# Patient Record
Sex: Female | Born: 1965 | Race: Black or African American | Hispanic: No | Marital: Married | Smoking: Never smoker
Health system: Southern US, Community
[De-identification: ages and names within clinical notes are randomized; demographics above are authoritative.]

## PROBLEM LIST (undated history)

## (undated) DIAGNOSIS — K259 Gastric ulcer, unspecified as acute or chronic, without hemorrhage or perforation: Secondary | ICD-10-CM

## (undated) DIAGNOSIS — I10 Essential (primary) hypertension: Secondary | ICD-10-CM

## (undated) DIAGNOSIS — D649 Anemia, unspecified: Secondary | ICD-10-CM

## (undated) DIAGNOSIS — E119 Type 2 diabetes mellitus without complications: Secondary | ICD-10-CM

## (undated) DIAGNOSIS — K5900 Constipation, unspecified: Secondary | ICD-10-CM

## (undated) DIAGNOSIS — E7800 Pure hypercholesterolemia, unspecified: Secondary | ICD-10-CM

## (undated) DIAGNOSIS — K862 Cyst of pancreas: Secondary | ICD-10-CM

## (undated) DIAGNOSIS — R7303 Prediabetes: Secondary | ICD-10-CM

## (undated) DIAGNOSIS — N289 Disorder of kidney and ureter, unspecified: Secondary | ICD-10-CM

## (undated) DIAGNOSIS — Z87442 Personal history of urinary calculi: Secondary | ICD-10-CM

## (undated) DIAGNOSIS — I209 Angina pectoris, unspecified: Secondary | ICD-10-CM

## (undated) DIAGNOSIS — N281 Cyst of kidney, acquired: Secondary | ICD-10-CM

## (undated) DIAGNOSIS — M5030 Other cervical disc degeneration, unspecified cervical region: Secondary | ICD-10-CM

## (undated) DIAGNOSIS — E559 Vitamin D deficiency, unspecified: Secondary | ICD-10-CM

## (undated) DIAGNOSIS — K219 Gastro-esophageal reflux disease without esophagitis: Secondary | ICD-10-CM

## (undated) DIAGNOSIS — R7302 Impaired glucose tolerance (oral): Secondary | ICD-10-CM

## (undated) DIAGNOSIS — M549 Dorsalgia, unspecified: Secondary | ICD-10-CM

## (undated) DIAGNOSIS — G4730 Sleep apnea, unspecified: Secondary | ICD-10-CM

## (undated) DIAGNOSIS — Z8489 Family history of other specified conditions: Secondary | ICD-10-CM

## (undated) HISTORY — PX: OTHER SURGICAL HISTORY: SHX169

## (undated) HISTORY — DX: Gastric ulcer, unspecified as acute or chronic, without hemorrhage or perforation: K25.9

## (undated) HISTORY — DX: Gastro-esophageal reflux disease without esophagitis: K21.9

## (undated) HISTORY — PX: CHOLECYSTECTOMY: SHX55

## (undated) HISTORY — PX: OOPHORECTOMY: SHX86

## (undated) HISTORY — PX: KNEE ARTHROSCOPY: SUR90

## (undated) HISTORY — PX: PARTIAL HYSTERECTOMY: SHX80

## (undated) HISTORY — DX: Vitamin D deficiency, unspecified: E55.9

## (undated) HISTORY — DX: Type 2 diabetes mellitus without complications: E11.9

## (undated) HISTORY — DX: Dorsalgia, unspecified: M54.9

## (undated) HISTORY — DX: Sleep apnea, unspecified: G47.30

## (undated) HISTORY — PX: BREAST BIOPSY: SHX20

## (undated) HISTORY — DX: Prediabetes: R73.03

## (undated) HISTORY — DX: Impaired glucose tolerance (oral): R73.02

## (undated) HISTORY — DX: Pure hypercholesterolemia, unspecified: E78.00

## (undated) HISTORY — PX: WISDOM TOOTH EXTRACTION: SHX21

## (undated) HISTORY — DX: Anemia, unspecified: D64.9

## (undated) HISTORY — DX: Other cervical disc degeneration, unspecified cervical region: M50.30

## (undated) HISTORY — DX: Cyst of pancreas: K86.2

---
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
## 1997-10-21 DIAGNOSIS — K259 Gastric ulcer, unspecified as acute or chronic, without hemorrhage or perforation: Secondary | ICD-10-CM

## 1997-10-21 HISTORY — DX: Gastric ulcer, unspecified as acute or chronic, without hemorrhage or perforation: K25.9

## 2001-01-02 ENCOUNTER — Observation Stay (HOSPITAL_COMMUNITY): Admission: RE | Admit: 2001-01-02 | Discharge: 2001-01-03 | Payer: Self-pay | Admitting: Obstetrics and Gynecology

## 2001-11-09 ENCOUNTER — Ambulatory Visit (HOSPITAL_COMMUNITY): Admission: RE | Admit: 2001-11-09 | Discharge: 2001-11-09 | Payer: Self-pay | Admitting: Internal Medicine

## 2001-11-09 ENCOUNTER — Encounter: Payer: Self-pay | Admitting: Internal Medicine

## 2005-01-28 ENCOUNTER — Ambulatory Visit (HOSPITAL_COMMUNITY): Admission: RE | Admit: 2005-01-28 | Discharge: 2005-01-28 | Payer: Self-pay | Admitting: *Deleted

## 2005-02-11 ENCOUNTER — Encounter: Admission: RE | Admit: 2005-02-11 | Discharge: 2005-02-11 | Payer: Self-pay | Admitting: *Deleted

## 2005-08-14 ENCOUNTER — Ambulatory Visit (HOSPITAL_COMMUNITY): Admission: RE | Admit: 2005-08-14 | Discharge: 2005-08-14 | Payer: Self-pay | Admitting: *Deleted

## 2006-01-28 ENCOUNTER — Encounter: Admission: RE | Admit: 2006-01-28 | Discharge: 2006-01-28 | Payer: Self-pay | Admitting: Obstetrics and Gynecology

## 2007-03-23 ENCOUNTER — Ambulatory Visit (HOSPITAL_COMMUNITY): Admission: RE | Admit: 2007-03-23 | Discharge: 2007-03-23 | Payer: Self-pay | Admitting: *Deleted

## 2007-07-31 ENCOUNTER — Ambulatory Visit (HOSPITAL_COMMUNITY): Admission: RE | Admit: 2007-07-31 | Discharge: 2007-07-31 | Payer: Self-pay | Admitting: Obstetrics and Gynecology

## 2007-07-31 ENCOUNTER — Encounter (INDEPENDENT_AMBULATORY_CARE_PROVIDER_SITE_OTHER): Payer: Self-pay | Admitting: Obstetrics and Gynecology

## 2008-09-13 ENCOUNTER — Ambulatory Visit (HOSPITAL_COMMUNITY): Admission: RE | Admit: 2008-09-13 | Discharge: 2008-09-13 | Payer: Self-pay | Admitting: Internal Medicine

## 2009-06-19 ENCOUNTER — Ambulatory Visit (HOSPITAL_COMMUNITY): Admission: RE | Admit: 2009-06-19 | Discharge: 2009-06-19 | Payer: Self-pay | Admitting: Internal Medicine

## 2009-09-20 ENCOUNTER — Ambulatory Visit (HOSPITAL_COMMUNITY): Admission: RE | Admit: 2009-09-20 | Discharge: 2009-09-20 | Payer: Self-pay | Admitting: Internal Medicine

## 2010-02-01 ENCOUNTER — Emergency Department (HOSPITAL_COMMUNITY): Admission: EM | Admit: 2010-02-01 | Discharge: 2010-02-01 | Payer: Self-pay | Admitting: Emergency Medicine

## 2010-02-06 ENCOUNTER — Emergency Department (HOSPITAL_COMMUNITY): Admission: EM | Admit: 2010-02-06 | Discharge: 2010-02-06 | Payer: Self-pay | Admitting: Emergency Medicine

## 2010-09-21 ENCOUNTER — Ambulatory Visit (HOSPITAL_COMMUNITY): Admission: RE | Admit: 2010-09-21 | Discharge: 2010-09-21 | Payer: Self-pay | Admitting: Obstetrics and Gynecology

## 2010-11-16 ENCOUNTER — Other Ambulatory Visit (HOSPITAL_COMMUNITY): Payer: Self-pay | Admitting: Internal Medicine

## 2010-11-22 ENCOUNTER — Other Ambulatory Visit (HOSPITAL_COMMUNITY): Payer: Self-pay

## 2011-03-05 NOTE — Op Note (Signed)
Jenna Chavez, Jenna Chavez               ACCOUNT NO.:  [id number]   MEDICAL RECORD NO.:  [id number]          PATIENT TYPE:  AMB   LOCATION:  SDC                           FACILITY:  [facility]   PHYSICIAN:  Guy Sandifer. Henderson Cloud, M.D. DATE OF BIRTH:  Jul 14, 1966   DATE OF PROCEDURE:  07/31/2007  DATE OF DISCHARGE:                               OPERATIVE REPORT   PREOPERATIVE DIAGNOSIS:  Left ovarian cyst.   POSTOPERATIVE DIAGNOSIS:  Left ovarian cyst.   PROCEDURE:  Laparoscopy with left salpingo-oophorectomy.   SURGEON:  Guy Sandifer. Henderson Cloud, M.D.   ANESTHESIA:  General with endotracheal intubation.   SPECIMENS:  Left tube and ovary to pathology.   ESTIMATED BLOOD LOSS:  Drops.   INDICATION AND CONSENT:  This patient is a 45 year old married black  female, G1, P1, status post LAVH, with a left ovarian cyst.  Details are  dictated in the history and physical.  Laparoscopy with left salpingo-  oophorectomy has been discussed preoperatively.  Potential risks and  complications were discussed preoperatively including but not limited to  infection, organ damage, bleeding requiring transfusion of blood  products with possible transfusion reaction, HIV and hepatitis  acquisition, DVT, PE, and pneumonia.  All questions have been answered  and consent is signed on the chart.   FINDINGS:  Upper abdomen is grossly normal.  Right tube and ovary is  normal.  The left ovary has two or three 2-3-cm parovarian cysts.  There  are some filmy adhesions to the pelvic sidewall.  There are a few filmy  adhesions of the sigmoid to the vaginal cuff.   PROCEDURE:  The patient was taken to operating room, where she is  identified, placed in dorsal supine position, and general anesthesia is  induced via endotracheal intubation.  She is then placed in the dorsal  lithotomy position, prepped abdominally and vaginally, the bladder is  straight-catheterized.  A ring clamp with a prep sponge is placed in the  vagina and the  patient is prepped in a sterile fashion.  The  infraumbilical and suprapubic areas are injected in the midline with  0.5% plain Marcaine.  A small infraumbilical incision is made and a  disposable Veress needle is placed on the first attempt without  difficulty.  Syringe and drop test are normal.  Two liters of gas are  insufflated under low pressure with a good tympany in the right upper  quadrant.  The Veress needle is removed and a 10/11 XL bladeless  disposable trocar sleeve is then placed using direct visualization with  the diagnostic laparoscope.  After placement, the operative laparoscope  is placed.  A small suprapubic incision is made in the midline and a 5-  mm XL bladeless disposable trocar sleeve is placed under direct  visualization without difficulty.  The above findings are noted.  The  course of the ureter is seen to be well clear of the area of surgery.  Then using sharp dissection, the adhesions around the left ovary and  parovarian cyst are taken down without difficulty.  After adequately  mobilizing the ovary, the gyrus bipolar cautery  cutting instrument is  used to take down the infundibulopelvic ligament across the mesosalpinx,  freeing up the tube and ovary, which was placed in the posterior cul-de-  sac.  Reinspection reveals excellent hemostasis and again reveals the  course the ureter to be well clear of any of the surgery.  Then the  operative laparoscope is removed and the 5-mm laparoscope is placed  through the suprapubic trocar sleeve.  Using the Ethicon bag through the  10/11 trocar sleeve, the specimen is retrieved through the subumbilical  incision without difficulty.  Replacement of the operative laparoscope  is used, irrigation is carried out, and careful inspection reveals good  hemostasis all around.  The suprapubic trocar sleeve is removed.  The  umbilical trocar sleeve is removed as well.  Under good visualization, a  0 Vicryl is used to  reapproximate the anterior fascia of the  subumbilical incision with a single stitch.  The skin of the  subumbilical incision is then closed with interrupted 2-0 Vicryl and  Dermabond is placed on both incisions.  Ring clamp was removed.  All  counts are correct.  The patient is awakened and taken to the recovery  room in stable condition.      Guy Sandifer Henderson Cloud, M.D.  Electronically Signed     JET/MEDQ  D:  07/31/2007  T:  08/01/2007  Job:  [id number]

## 2011-03-05 NOTE — H&P (Signed)
Jenna Chavez, Jenna Chavez               ACCOUNT NO.:  [id number]   MEDICAL RECORD NO.:  [id number]           PATIENT TYPE:   LOCATION:                                FACILITY:  [facility]   PHYSICIAN:  Guy Sandifer. Henderson Cloud, M.D.      DATE OF BIRTH:   DATE OF ADMISSION:  07/31/2007  DATE OF DISCHARGE:                              HISTORY & PHYSICAL   CHIEF COMPLAINT:  Ovarian cyst.   HISTORY OF PRESENT ILLNESS:  This patient is a 45 year old, married,  black female, G1, P1, status post LAVH with pelvic pain that comes and  goes over the past several months. Ultrasound in my office on 04/21/07,  revealed bilateral ovarian cysts.  Followup ultrasound on 07/06/07,  revealed resolution of the right ovarian cyst but a growth in the left  ovary with the cyst measuring 2.7 cm with at least 2 calcified areas  suggestive of a dermoid cyst.  After discussion of options, the patient  is being admitted for laparoscopic left salpingo-oophorectomy.  Potential risks and complications have been reviewed preoperatively.   PAST MEDICAL HISTORY:  Negative.   PAST SURGICAL HISTORY:  1. Cholecystectomy 2001.  2. LAVH.   OBSTETRIC HISTORY:  Vaginal delivery x1.   FAMILY HISTORY:  Multiple gestation sister, diabetes in mother. Chronic  hypertension in mother, sister and brother. Enlarged heart in mother.  Asthma in mother.   MEDICATIONS:  None.   ALLERGIES:  NO KNOWN DRUG ALLERGIES.   SOCIAL HISTORY:  Denies tobacco, alcohol or drug abuse.   REVIEW OF SYSTEMS:  NEUROLOGICAL:  Denies headache.  CARDIAC:  Denies  chest pain.  PULMONARY:  Denies shortness of breath.  GI: Denies recent  changes in bowel habits.   PHYSICAL EXAMINATION:  VITAL SIGNS: Height 5 feet 7 inches, weight 218.4  pounds, blood pressure 130/82.  HEENT: Without thyromegaly.  LUNGS: Clear to auscultation.  HEART: Regular rate and rhythm.  BACK: Without CVA tenderness.  BREASTS: Without masses, retraction, discharge.  ABDOMEN: Soft, nontender  without masses.  PELVIC: Vulva, vagina without lesions. Adnexa nontender without palpable  masses.  EXTREMITIES: Grossly within normal limits.  NEUROLOGICAL: Exam is grossly within normal limits.   ASSESSMENT:  Left ovarian cyst.   PLAN:  Laparoscopy with left salpingo-oophorectomy.      Guy Sandifer Henderson Cloud, M.D.  Electronically Signed     JET/MEDQ  D:  07/24/2007  T:  07/24/2007  Job:  [id number]

## 2011-03-08 NOTE — Op Note (Signed)
[facility]  Patient:    ANGLEA, GORDNER                      MRN: [id number] Proc. Date: 01/02/01 Adm. Date:  [id number] Attending:  Cordelia Pen Ii                           Operative Report  PREOPERATIVE DIAGNOSIS:       Uterine leiomyomata.  POSTOPERATIVE DIAGNOSES:      1. Uterine leiomyomata.                               2. Endometriosis.  PROCEDURE:                    Laparoscopically assisted vaginal hysterectomy                               and ablation of endometriosis.  SURGEON:                      Guy Sandifer. Arleta Creek, M.D.  ASSISTANT:                    Raynald Kemp, M.D.  ANESTHESIA:                   General endotracheal intubation,                               Cherre Blanc, M.D.  ESTIMATED BLOOD LOSS:         300 cc.  INDICATIONS AND CONSENT:      This patient is a 45 year old, married black female, G1, P1, with increasingly heavy menstrual flows. She has known uterine leiomyomata. After careful discussion of the options, laparoscopically assisted vaginal hysterectomy and removal of an ovary only if distinctly abnormal as discussed. Possible risks and complications have been discussed including, but not limited to, infection, bowel/bladder/ureteral damage, bleeding requiring transfusion of blood products with possible transfusion reaction, HIV and hepatitis acquisition, DVT, PE, pneumonia, fistula formation, laparotomy. All questions have been answered and consent is signed and on the chart.  FINDINGS:                     Upper abdomen is normal. Appendix is normal. Uterus is 8-10 weeks in size with intramural leiomyomata and a single 1-2 cm pedunculated leiomyoma on the right anterior uterine fundus. Tubes and ovaries are normal bilaterally. Anterior cul-de-sac is normal. Posterior cul-de-sac contains several dark brown to black lesions consistent with endometriosis. Pelvic sidewalls are normal.   DESCRIPTION  OF PROCEDURE:     The patient is taken to the operating room, placed in the dorsal supine position where general anesthesia is induced via endotracheal intubation. She is then placed in the dorsal lithotomy position where she is prepped abdominally and vaginally. The bladder is straight catheterized. A Hulka tenaculum is placed in the uterus as a manipulator and she is draped in a sterile fashion. A small infraumbilical incision is made and a 12 mm disposable trocar sleeve is placed on the first attempt without difficulty. Placement is verified with a laparoscope and no damage to surrounding structures is noted. Pneumoperitoneum is induced. A small suprapubic  and later left lower quadrant incisions are made after careful transillumination and a 5 mm nondisposable trocar sleeves are placed under direct visualization without difficulty. The above findings are noted. The course of the ureters is carefully identified bilaterally and found to be well out of the way of the procedure. Bipolar cautery is used to ablate the endometriosis in the posterior cul-de-sac. Copious irrigation is carried out. Then, using the 5 mm laparoscope through the left lower trocar sleeve, the Ligasure instrument is used to take down the proximal ligaments bilaterally down to just above the level of the vesicouterine peritoneum bilaterally. Then, switching back to the operative laparoscope, good hemostasis is noted on all the pedicles again. The vesicouterine peritoneum is then incised in the midline, hydrodissected, and taken down bilaterally. After noting good hemostasis, instruments are removed and attention is turned to the vagina.  The posterior cul-de-sac is entered sharply and the cervix is circumscribed with a scalpel. The mucosa is advanced bluntly and sharply. The uterosacral ligaments are taken bilaterally and are ligated with 0 Monocryl suture. All suture will be 0 Monocryl unless otherwise designated. The  bladder pillars followed by the cardinal ligaments are taken bilaterally. Anterior cul-de-sac is entered without difficulty. The uterine vessels are taken bilaterally and singly ligated. Two bites is taken above this level bilaterally. Fundus then delivers posteriorly without difficulty and is cut free. The right pedicle is essentially gone from the bites from below. The left pedicle is doubly ligated with two free ties. Uterosacral ligaments are then plicated to the vagina bilaterally. Uterosacral ligaments are then plicated in the midline with a single suture. Cuff is closed with figure-of-eights. Foley catheter is placed in the bladder as a drain. Clear urine is noted. It should be noted that the bladder was straight catheterized for clear urine prior to beginning the vaginal part of the case as well. Attention is then returned to the abdomen.  Pneumoperitoneum is re-created and close inspection reveals minor oozing from one of the pedicles which is easily controlled with bipolar cautery. Careful inspection under reduced pneumoperitoneum also reveals good hemostasis. Excess fluid is removed. Inferior trocar sleeves are removed and pneumoperitoneum is once again reduced and no bleeding is noted from any site. Pneumoperitoneum is completely reduced. The umbilical trocar sleeve is removed. The umbilical incision is closed with a 0 Vicryl suture and the deeper underlying layers with care being taken not to pick up any underlying structures. The incisions are all closed with subcuticular 3-0 Vicryl suture. The incisions are injected with 0.25% plain Marcaine. Dressings are applied. All counts are correct. The patient is awakened and taken to the recovery room is stable condition. DD:  01/02/01 TD:  01/02/01 Job: [id number]

## 2011-03-08 NOTE — H&P (Signed)
[facility]  Patient:    Jenna Chavez, Jenna Chavez                        MRN: Adm. Date:  01/02/01 Attending:  Guy Sandifer. Arleta Creek, M.D.                         History and Physical  CHIEF COMPLAINT:              Uterine leiomyomata and menorrhagia.  HISTORY OF PRESENT ILLNESS:   The patient is a 45 year old married black female, G1, P1, who has increasingly heavy flows for three days each month. She changes a pad every one hour, sometimes overflowing her clothes.  She also has some positional dyspareunia.  Physical examination is consistent with an eight week size uterus.  Pelvic ultrasound revealed the uterusmeasuring 10.2 x 5.9 x 4.7 cm with multiple leiomyomata ranging from 2-2.5 cm. Sonohysterogram is consistent with a probable submucous leiomyomata.  After a careful discussion of the options, the patient requests definitive management. She is being admitted for laparoscopically-assisted vaginal hysterectomy and removal of one ovary only unless distinctly abnormal.  PAST MEDICAL HISTORY:         Negative.  PAST SURGICAL HISTORY:        Cholecystectomy 2001.  MEDICATIONS:                  Vitamins.  ALLERGIES:                    No known drug allergies.  SOCIAL HISTORY:               The patient denies tobacco, alcohol, or drug abuse.  FAMILY HISTORY:               Multiple gestation in patients sister. Diabetes in mother.  Chronic hypertension mother, sister, and brother. Cardiomegaly in mother, asthma in mother.  PAST OBSTETRICAL HISTORY:     Vaginal delivery x 1.  REVIEW OF SYSTEMS:   Negative except as above.  PHYSICAL EXAMINATION:  VITAL SIGNS:                  Height 5 feet 7 inches, weight 234 pounds. Blood pressure 140/90.  HEENT:                        Without thyromegaly.  LUNGS:                        Clear to auscultation.  HEART:                        Regular rate and rhythm.  BACK:               Without CVA  tenderness.  BREASTS:Without masses, retractions or discharge.  ABDOMEN:   Obese and soft with mild suprapubic and bilateral lower quadrant tenderness without rebound or masses.  PELVIC:                       Vulva, vagina and cervix without lesions. Uterus is anteverted, approximatelyeight weeks in size, mildly tender. Adnexa nontender without masses.  EXTREMITIES:                  Grossly within normal limits.  NEUROLOGICAL:  Grossly within normal limits.  ASSESSMENT:                   Uterine leiomyomata and secondary menorrhagia.  PLAN:                         Laparoscopically assisted vaginal hysterectomy and removal of an ovary only if distinctly abnormal. DD:  12/30/00 TD:  12/30/00 Job: [id number]

## 2011-03-08 NOTE — Discharge Summary (Signed)
[facility]  Patient:    Jenna Chavez, Jenna Chavez                      MRN: [id number] Adm. Date:  [id number] Disc. Date: [id number] Attending:  Cordelia Pen Ii                           Discharge Summary  ADMISSION DIAGNOSIS:          Uterine leiomyomata.  DISCHARGE DIAGNOSES:          1. Uterine leiomyomata.                               2. Endometriosis.  PROCEDURE:                    January 02, 2001, laparoscopically-assisted vaginal hysterectomy and ablation of endometriosis.  REASON FOR ADMISSION:         This patient is a 45 year old married black female, G1, P1, with increasingly heavy menstrual flows with known uterine leiomyomata.  She is admitted for definitive surgical therapy.  HOSPITAL COURSE:              The patient was admitted to the hospital, underwent the above procedure without complications.  Estimated blood loss was 300 cc.  On the evening of surgery she has good pain relief and is without complaint.  Urine output is clear and adequate volume.  Vital signs are stable.  On day of discharge, abdomen is soft.  Vital signs are stable, and she is afebrile.  She is ambulating, passing flatus, tolerating regular diet.  LABORATORY AND X-RAY DATA:    On January 03, 2001, hemoglobin 10.8, white count 10.3, platelet count 283,000.  DISCHARGE MEDICATIONS:        1. Tylox #30 one to two p.o. q.6h. p.r.n.                               2. Multivitamin one p.o. q.d.  CONDITION ON DISCHARGE:       Good.  DISCHARGE INSTRUCTIONS:       Diet:  Regular as tolerated.  The patient is to call the office for problems including, but not limited to temperature over 100 degrees, persistent nausea or vomiting, increasing pain, or heavy vaginal bleeding.  No vaginal entry.  No driving of automobiles.  No heavy lifting.  DISCHARGE FOLLOWUP:           Follow-up is in the office in one to two weeks. DD:  01/03/01 TD:  01/04/01 Job: [id number]

## 2011-06-04 ENCOUNTER — Encounter (HOSPITAL_COMMUNITY): Payer: Self-pay

## 2011-06-04 ENCOUNTER — Other Ambulatory Visit (HOSPITAL_COMMUNITY): Payer: Self-pay | Admitting: Internal Medicine

## 2011-06-04 ENCOUNTER — Ambulatory Visit (HOSPITAL_COMMUNITY)
Admission: RE | Admit: 2011-06-04 | Discharge: 2011-06-04 | Disposition: A | Discharge status: Home / Self Care | Payer: BC Managed Care – PPO | Source: Ambulatory Visit | Attending: Internal Medicine | Admitting: Internal Medicine

## 2011-06-04 DIAGNOSIS — R9389 Abnormal findings on diagnostic imaging of other specified body structures: Secondary | ICD-10-CM | POA: Insufficient documentation

## 2011-06-04 DIAGNOSIS — R110 Nausea: Secondary | ICD-10-CM | POA: Insufficient documentation

## 2011-06-04 DIAGNOSIS — R1084 Generalized abdominal pain: Secondary | ICD-10-CM

## 2011-06-04 DIAGNOSIS — Q619 Cystic kidney disease, unspecified: Secondary | ICD-10-CM | POA: Insufficient documentation

## 2011-06-04 DIAGNOSIS — R109 Unspecified abdominal pain: Secondary | ICD-10-CM | POA: Insufficient documentation

## 2011-06-04 MED ORDER — IOHEXOL 300 MG/ML  SOLN
100.0000 mL | Freq: Once | INTRAMUSCULAR | Status: AC | PRN
  Administered 2011-06-04: 100 mL via INTRAVENOUS

## 2011-06-13 ENCOUNTER — Encounter: Payer: Self-pay | Admitting: Gastroenterology

## 2011-06-13 ENCOUNTER — Ambulatory Visit (INDEPENDENT_AMBULATORY_CARE_PROVIDER_SITE_OTHER): Payer: BC Managed Care – PPO | Admitting: Gastroenterology

## 2011-06-13 VITALS — BP 144/84 | HR 78 | Temp 97.3°F | Ht 66.0 in | Wt 222.8 lb

## 2011-06-13 DIAGNOSIS — R1032 Left lower quadrant pain: Secondary | ICD-10-CM | POA: Insufficient documentation

## 2011-06-13 MED ORDER — FAMOTIDINE-CA CARB-MAG HYDROX 10-800-165 MG PO CHEW: 1.0000 | CHEWABLE_TABLET | Freq: Every day | ORAL | Status: DC | PRN

## 2011-06-13 NOTE — Progress Notes (Signed)
Referring Provider: Cassell Smiles., MD Primary Care Physician:  Cassell Smiles., MD Primary Gastroenterologist:  Dr. Jena Gauss   Chief Complaint  Patient presents with  . Abdominal Pain  . Nausea    HPI:   Jenna Chavez is a very pleasant 45 year old African-American female who presents today at the request of Dr. Sherwood Gambler, secondary to abdominal pain. She has been seen by Dr. Jena Gauss in the remote past, and she reports an EGD with findings of PUD. However, I am unable to find these reports at the time of this visit. We will request these from medical records.  She presents today with intermittent burning in LLQ since last week. States eating worsens, causing pain like knives from the epigastric area down to mid abdomen. This is intermittent in nature, relieved by nothing. Feels sensation of fullness even when not eating. Denies N/V. No fever. She reports chronic hx of constipation, but within the last week she has had better regularity. No melena or brbpr. Denies dysphagia. She does report intermittent indigestion, which is rare. She has taken Pepcid complete in the past with excellent results. Denies NSAIDs, aspirin powders.    CT August 2012: small left renal cyst, increased in size since April 2011. Small cyst in right pelvis, suspect ovarian origin. Unable to exclude wall thickening of ascending colon although could be artifact from underdistension. Tiny umbilical hernia containing fat.   Past Medical History  Diagnosis Date  . Anemia     2008  . Glucose intolerance (impaired glucose tolerance)   . GERD (gastroesophageal reflux disease)   . PUD (peptic ulcer disease)     Dr. Jena Gauss: egd    Past Surgical History  Procedure Date  . Foot surgery     right  . Cholecystectomy   . Partial hysterectomy   . Oophorectomy     and fallopian tube on left side    Current Outpatient Prescriptions  Medication Sig Dispense Refill  . HYDROcodone-acetaminophen (VICODIN) 5-500 MG per tablet Take  1 tablet by mouth every 6 (six) hours as needed.        . famotidine-calcium carbonate-magnesium hydroxide (PEPCID COMPLETE) 10-800-165 MG CHEW Chew 1 tablet by mouth daily as needed.  31 tablet  3    Allergies as of 06/13/2011  . (No Known Allergies)    Family History  Problem Relation Age of Onset  . Colon polyps Father   . Colon cancer Neg Hx     History   Social History  . Marital Status: Married    Spouse Name: N/A    Number of Children: N/A  . Years of Education: N/A   Occupational History  . Not on file.   Social History Main Topics  . Smoking status: Never Smoker   . Smokeless tobacco: Not on file  . Alcohol Use: No  . Drug Use: No  . Sexually Active: Not on file   Other Topics Concern  . Not on file   Social History Narrative  . No narrative on file    Review of Systems: Gen: Denies any fever, chills, loss of appetite, fatigue, weight loss. CV: Denies chest pain, heart palpitations, syncope, peripheral edema. Resp: Denies shortness of breath with rest, cough, wheezing GI: Denies dysphagia or odynophagia. Denies hematemesis, fecal incontinence, or jaundice.  GU : Denies urinary burning, urinary frequency, urinary incontinence.  MS: Denies joint pain, muscle weakness, cramps, limited movement Derm: Denies rash, itching, dry skin Psych: Denies depression, anxiety, confusion or memory loss  Heme: Denies bruising,  bleeding, and enlarged lymph nodes.  Physical Exam: BP 144/84  Pulse 78  Temp(Src) 97.3 F (36.3 C) (Temporal)  Ht 5\' 6"  (1.676 m)  Wt 222 lb 12.8 oz (101.061 kg)  BMI 35.96 kg/m2 General:   Alert and oriented. Well-developed, well-nourished, pleasant and cooperative. Head:  Normocephalic and atraumatic. Eyes:  Conjunctiva pink, sclera clear, no icterus.   Conjunctiva pink. Ears:  Normal auditory acuity. Nose:  No deformity, discharge,  or lesions. Mouth:  No deformity or lesions, mucosa pink and moist.  Neck:  Supple, without mass or  thyromegaly. Lungs:  Clear to auscultation bilaterally, without wheezing, rales, or rhonchi.  Heart:  S1, S2 present without murmurs noted.  Abdomen:  +BS, soft, mildly tender to palpation LLQ. non-distended. Without mass or HSM. No rebound or guarding. No hernias noted. Rectal:  Deferred  Msk:  Symmetrical without gross deformities. Normal posture. Extremities:  Without clubbing or edema. Neurologic:  Alert and  oriented x4;  grossly normal neurologically. Skin:  Intact, warm and dry without significant lesions or rashes Cervical Nodes:  No significant cervical adenopathy. Psych:  Alert and cooperative. Normal mood and affect.

## 2011-06-13 NOTE — Patient Instructions (Signed)
Start taking Pepcid Complete daily.   We have set you up for a colonoscopy and possibly a look at your upper GI tract as well.  Follow the low-residue diet in the meantime between now and procedure.  If you have any worsening of pain, fever or chills, or inability to drink or eat, seek medical attention.

## 2011-06-13 NOTE — Assessment & Plan Note (Signed)
Pleasant 45 year old female with several week hx of LLQ abdominal pain described as burning. No precipitating or alleviating factors. Intermittent in nature. Does report epigastric discomfort "like knives" extending down to mid-abdomen with eating. Reports sensation of fullness. Intermittent reflux that is well-controlled with Pepcid Complete. Denies N/V, dysphagia. Hx of chronic constipation, although she has been fairly regular lately. No melena or brbpr. CT with findings of possible ascending colon wall-thickening vs artifact from underdistension. Would benefit from lower GI tract evaluation. Doubt dealing with diverticulitis at this time. Due to hx of reported PUD, may need upper GI evaluation at time of TCS. Will have pt restart Pepcid, as this has worked best for her in the past (as opposed to Prilosec and Nexium).  Proceed with TCS with Dr. Jena Gauss in near future: the risks, benefits, and alternatives have been discussed with the patient in detail. The patient states understanding and desires to proceed. Possible upper endoscopy at time of colonoscopy. Low residue diet in interim Pepcid Complete, may need to change to PPI in future Contact us if fever/chills, N/V, increased abdominal pain.  Pt is stable and in good spirits. Appears quite well at time of visit.

## 2011-06-17 NOTE — Progress Notes (Signed)
Cc to PCP 

## 2011-06-20 MED ORDER — SODIUM CHLORIDE 0.45 % IV SOLN
Freq: Once | INTRAVENOUS | Status: AC
  Administered 2011-06-21: 08:00:00 via INTRAVENOUS

## 2011-06-21 ENCOUNTER — Ambulatory Visit (HOSPITAL_COMMUNITY)
Admission: RE | Admit: 2011-06-21 | Discharge: 2011-06-21 | Disposition: A | Discharge status: Home / Self Care | Payer: BC Managed Care – PPO | Source: Ambulatory Visit | Attending: Internal Medicine | Admitting: Internal Medicine

## 2011-06-21 ENCOUNTER — Encounter (HOSPITAL_COMMUNITY)
Admission: RE | Disposition: A | Discharge status: Home / Self Care | Payer: Self-pay | Source: Ambulatory Visit | Attending: Internal Medicine

## 2011-06-21 ENCOUNTER — Encounter (HOSPITAL_COMMUNITY): Payer: Self-pay | Admitting: *Deleted

## 2011-06-21 DIAGNOSIS — Z800 Family history of malignant neoplasm of digestive organs: Secondary | ICD-10-CM | POA: Insufficient documentation

## 2011-06-21 DIAGNOSIS — R933 Abnormal findings on diagnostic imaging of other parts of digestive tract: Secondary | ICD-10-CM

## 2011-06-21 DIAGNOSIS — R198 Other specified symptoms and signs involving the digestive system and abdomen: Secondary | ICD-10-CM

## 2011-06-21 DIAGNOSIS — K5909 Other constipation: Secondary | ICD-10-CM | POA: Insufficient documentation

## 2011-06-21 DIAGNOSIS — R109 Unspecified abdominal pain: Secondary | ICD-10-CM | POA: Insufficient documentation

## 2011-06-21 HISTORY — PX: ESOPHAGOGASTRODUODENOSCOPY: SHX5428

## 2011-06-21 HISTORY — PX: COLONOSCOPY: SHX5424

## 2011-06-21 SURGERY — COLONOSCOPY
Anesthesia: Moderate Sedation

## 2011-06-21 MED ORDER — MEPERIDINE HCL 100 MG/ML IJ SOLN
INTRAMUSCULAR | Status: AC
  Filled 2011-06-21: qty 2

## 2011-06-21 MED ORDER — STERILE WATER FOR IRRIGATION IR SOLN
Status: DC | PRN
  Administered 2011-06-21: 09:00:00

## 2011-06-21 MED ORDER — MIDAZOLAM HCL 5 MG/5ML IJ SOLN
INTRAMUSCULAR | Status: AC
  Filled 2011-06-21: qty 10

## 2011-06-21 MED ORDER — MEPERIDINE HCL 100 MG/ML IJ SOLN
INTRAMUSCULAR | Status: DC | PRN
  Administered 2011-06-21: 50 mg via INTRAVENOUS
  Administered 2011-06-21 (×3): 25 mg via INTRAVENOUS

## 2011-06-21 MED ORDER — MIDAZOLAM HCL 5 MG/5ML IJ SOLN
INTRAMUSCULAR | Status: DC | PRN
  Administered 2011-06-21: 2 mg via INTRAVENOUS
  Administered 2011-06-21 (×2): 1 mg via INTRAVENOUS
  Administered 2011-06-21: 2 mg via INTRAVENOUS
  Administered 2011-06-21: 1 mg via INTRAVENOUS

## 2011-06-21 NOTE — H&P (Addendum)
Referring Provider: Cassell Smiles., MD  Primary Care Physician: Cassell Smiles., MD  Primary Gastroenterologist: Dr. Jena Gauss  Chief Complaint   Patient presents with   .  Abdominal Pain   .  Nausea   HPI:  [name] is a very pleasant 45 year old African-American female who presents today at the request of Dr. Sherwood Gambler, secondary to abdominal pain. She has been seen by Dr. Jena Gauss in the remote past, and she reports an EGD with findings of PUD. However, I am unable to find these reports at the time of this visit. We will request these from medical records.  She presents today with intermittent burning in LLQ since last week. States eating worsens, causing pain like knives from the epigastric area down to mid abdomen. This is intermittent in nature, relieved by nothing. Feels sensation of fullness even when not eating. Denies N/V. No fever. She reports chronic hx of constipation, but within the last week she has had better regularity. No melena or brbpr. Denies dysphagia. She does report intermittent indigestion, which is rare. She has taken Pepcid complete in the past with excellent results. Denies NSAIDs, aspirin powders.  CT August 2012: small left renal cyst, increased in size since April 2011. Small cyst in right pelvis, suspect ovarian origin. Unable to exclude wall thickening of ascending colon although could be artifact from underdistension. Tiny umbilical hernia containing fat.  Past Medical History   Diagnosis  Date   .  Anemia      2008   .  Glucose intolerance (impaired glucose tolerance)    .  GERD (gastroesophageal reflux disease)    .  PUD (peptic ulcer disease)      Dr. Jena Gauss: egd    Past Surgical History   Procedure  Date   .  Foot surgery      right   .  Cholecystectomy    .  Partial hysterectomy    .  Oophorectomy      and fallopian tube on left side    Current Outpatient Prescriptions   Medication  Sig  Dispense  Refill   .  HYDROcodone-acetaminophen (VICODIN)  5-500 MG per tablet  Take 1 tablet by mouth every 6 (six) hours as needed.     .  famotidine-calcium carbonate-magnesium hydroxide (PEPCID COMPLETE) 10-800-165 MG CHEW  Chew 1 tablet by mouth daily as needed.  31 tablet  3    Allergies as of 06/13/2011   .  (No Known Allergies)    Family History   Problem  Relation  Age of Onset   .  Colon polyps  Father    .  Colon cancer  Neg Hx     History    Social History   .  Marital Status:  Married     Spouse Name:  N/A     Number of Children:  N/A   .  Years of Education:  N/A    Occupational History   .  Not on file.    Social History Main Topics   .  Smoking status:  Never Smoker   .  Smokeless tobacco:  Not on file   .  Alcohol Use:  No   .  Drug Use:  No   .  Sexually Active:  Not on file    Other Topics  Concern   .  Not on file    Social History Narrative   .  No narrative on file   Review of Systems:  Gen: Denies  any fever, chills, loss of appetite, fatigue, weight loss.  CV: Denies chest pain, heart palpitations, syncope, peripheral edema.  Resp: Denies shortness of breath with rest, cough, wheezing  GI: Denies dysphagia or odynophagia. Denies hematemesis, fecal incontinence, or jaundice.  GU : Denies urinary burning, urinary frequency, urinary incontinence.  MS: Denies joint pain, muscle weakness, cramps, limited movement  Derm: Denies rash, itching, dry skin  Psych: Denies depression, anxiety, confusion or memory loss  Heme: Denies bruising, bleeding, and enlarged lymph nodes.  Physical Exam:  BP 144/84  Pulse 78  Temp(Src) 97.3 F (36.3 C) (Temporal)  Ht 5\' 6"  (1.676 m)  Wt 222 lb 12.8 oz (101.061 kg)  BMI 35.96 kg/m2  General: Alert and oriented. Well-developed, well-nourished, pleasant and cooperative.  Head: Normocephalic and atraumatic.  Eyes: Conjunctiva pink, sclera clear, no icterus. Conjunctiva pink.  Ears: Normal auditory acuity.  Nose: No deformity, discharge, or lesions.  Mouth: No deformity or  lesions, mucosa pink and moist.  Neck: Supple, without mass or thyromegaly.  Lungs: Clear to auscultation bilaterally, without wheezing, rales, or rhonchi.  Heart: S1, S2 present without murmurs noted.  Abdomen: +BS, soft, mildly tender to palpation LLQ. non-distended. Without mass or HSM. No rebound or guarding. No hernias noted.  Rectal: Deferred  Msk: Symmetrical without gross deformities. Normal posture.  Extremities: Without clubbing or edema.  Neurologic: Alert and oriented x4; grossly normal neurologically.  Skin: Intact, warm and dry without significant lesions or rashes  Cervical Nodes: No significant cervical adenopathy.  Psych: Alert and cooperative. Normal mood and affect.    Pleasant 45 year old female with several week hx of LLQ abdominal pain described as burning. No precipitating or alleviating factors. Intermittent in nature. Does report epigastric discomfort "like knives" extending down to mid-abdomen with eating. Reports sensation of fullness. Intermittent reflux that is well-controlled with Pepcid Complete. Denies N/V, dysphagia. Hx of chronic constipation, although she has been fairly regular lately. No melena or brbpr. CT with findings of possible ascending colon wall-thickening vs artifact from underdistension. Would benefit from lower GI tract evaluation. Doubt dealing with diverticulitis at this time. Due to hx of reported PUD, may need upper GI evaluation at time of TCS. Will have pt restart Pepcid, as this has worked best for her in the past (as opposed to Prilosec and Nexium).  Proceed with TCS with Dr. Jena Gauss in near future: the risks, benefits, and alternatives have been discussed with the patient in detail. The patient states understanding and desires to proceed.  Possible upper endoscopy at time of colonoscopy.  Low residue diet in interim  Pepcid Complete, may need to change to PPI in future  Contact us if fever/chills, N/V, increased abdominal pain.  Pt is stable  and in good spirits. Appears quite well at time of visit.       I have seen the patient prior to the procedure(s) today and reviewed the history and physical / consultation from 06/13/11.  Patient reports since going back on Pepcid she has no upper GI tract symptoms such as epigastric pain and reflux nausea vomiting or dysphagia. Her lower abdominal symptoms intermittent -  may occur 3-4 times daily and lasts for 15 minutes as she reports. Chronically constipated but no more than one bowel movement every 4 days. There have been no changes. After consideration of the risks, benefits, alternatives and imponderables, the patient has consented to Colonoscopy.  No EGD indicated today.

## 2011-07-01 ENCOUNTER — Encounter (HOSPITAL_COMMUNITY): Payer: Self-pay | Admitting: Internal Medicine

## 2011-08-01 LAB — CBC
MCHC: 34.7
RBC: 4.46
RDW: 13.9

## 2011-08-21 ENCOUNTER — Other Ambulatory Visit (HOSPITAL_COMMUNITY): Payer: Self-pay | Admitting: Obstetrics and Gynecology

## 2011-08-21 DIAGNOSIS — Z139 Encounter for screening, unspecified: Secondary | ICD-10-CM

## 2011-09-23 ENCOUNTER — Ambulatory Visit (HOSPITAL_COMMUNITY)
Admission: RE | Admit: 2011-09-23 | Discharge: 2011-09-23 | Disposition: A | Discharge status: Home / Self Care | Payer: BC Managed Care – PPO | Source: Ambulatory Visit | Attending: Obstetrics and Gynecology | Admitting: Obstetrics and Gynecology

## 2011-09-23 DIAGNOSIS — Z1231 Encounter for screening mammogram for malignant neoplasm of breast: Secondary | ICD-10-CM | POA: Insufficient documentation

## 2011-09-23 DIAGNOSIS — Z139 Encounter for screening, unspecified: Secondary | ICD-10-CM

## 2012-01-03 LAB — COMPREHENSIVE METABOLIC PANEL
Albumin: 3.9
BUN: 14 mg/dL (ref 4–21)
CO2: 24 mmol/L
Calcium: 9.2 mg/dL
Chloride: 106 mmol/L
Glucose: 90 mg/dL
Potassium: 3.9 mmol/L

## 2012-01-04 LAB — CBC WITH DIFFERENTIAL/PLATELET
Amylase: 83 units/L (ref 25–110)
Hemoglobin: 13.3 g/dL (ref 12.0–16.0)
Lipase: 28 units/L (ref 0–53)
MCHC: 33.2
RBC: 4.57

## 2012-01-22 ENCOUNTER — Encounter: Payer: Self-pay | Admitting: Gastroenterology

## 2012-01-22 ENCOUNTER — Ambulatory Visit (INDEPENDENT_AMBULATORY_CARE_PROVIDER_SITE_OTHER): Payer: BC Managed Care – PPO | Admitting: Gastroenterology

## 2012-01-22 DIAGNOSIS — K5900 Constipation, unspecified: Secondary | ICD-10-CM

## 2012-01-22 DIAGNOSIS — K3189 Other diseases of stomach and duodenum: Secondary | ICD-10-CM

## 2012-01-22 DIAGNOSIS — R109 Unspecified abdominal pain: Secondary | ICD-10-CM

## 2012-01-22 DIAGNOSIS — R1013 Epigastric pain: Secondary | ICD-10-CM

## 2012-01-22 MED ORDER — DEXLANSOPRAZOLE 60 MG PO CPDR: 60.0000 mg | DELAYED_RELEASE_CAPSULE | Freq: Every day | ORAL | Status: DC

## 2012-01-22 MED ORDER — LINACLOTIDE 145 MCG PO CAPS: 1.0000 | ORAL_CAPSULE | Freq: Every day | ORAL | Status: DC

## 2012-01-22 NOTE — Progress Notes (Signed)
Referring Provider: Cassell Smiles., MD Primary Care Physician:  Cassell Smiles., MD, MD Primary Gastroenterologist: Dr. Jena Gauss   Chief Complaint  Patient presents with  . Abdominal Pain    and both sides    HPI:   Returns today in f/u, last seen Aug 2012 and set up for colonoscopy. Hx of constipation in past. TCS normal. Continues to report lower abdominal pain, pain in groin bilaterally, radiates to around back, lower back. Upper abdominal pain intermittent, not associated with any other factors. No NSAIDs, aspirin powder, N/V. Notes intermittent reflux, takes Pepcid prn.  BM every 3-4 days. Not taking Miralax. Sometimes just forgets. Pain not r/t anything. Wakes up hurting in morning. Stays cold a lot. Down 3 lbs. Not purposeful. Sometimes feels like eating, sometimes not. No bloating. Stays cold, no fever. Reports vague urinary burning on Sunday but resolved now.   Failed Prilosec and Nexium. No Protonix.   Vaginal US in Aug 2012 to check out cyst per pt.   CT August 2012: small left renal cyst, increased in size since April 2011. Small cyst in right pelvis, suspect ovarian origin. Unable to exclude wall thickening of ascending colon although could be artifact from underdistension. Tiny umbilical hernia containing fat.  MARCH 2013 outside labs: normal CBC, CMP, lipase, amylase   Past Medical History  Diagnosis Date  . Glucose intolerance (impaired glucose tolerance)   . GERD (gastroesophageal reflux disease)   . PUD (peptic ulcer disease)     Dr. Rourk: egd  . Anemia     20 08  . Constipation   . S/P endoscopy March 1999    Dr. Jena Gauss: normal EGD, healed gastric ulcer  . S/P endoscopy Jan 1999    gastric ulcer, negative malignancy    Past Surgical History  Procedure Date  . Cholecystectomy   . Partial hysterectomy   . Oophorectomy     and fallopian tube on left side  . Right toe surgery   . Colonoscopy 06/21/2011    RMR: normal rectum, colon, TI, repeat Aug 2017    . Esophagogastroduodenoscopy 06/21/2011    Planned but not done at time of TCS, no need    Current Outpatient Prescriptions  Medication Sig Dispense Refill  . famotidine-calcium carbonate-magnesium hydroxide (PEPCID COMPLETE) 10-800-165 MG CHEW Chew 1 tablet by mouth daily as needed.  31 tablet  3  . HYDROcodone-acetaminophen (VICODIN) 5-500 MG per tablet Take 1 tablet by mouth every 6 (six) hours as needed.       Marland Kitchen dexlansoprazole (DEXILANT) 60 MG capsule Take 1 capsule (60 mg total) by mouth daily.  14 capsule  0  . Linaclotide 145 MCG CAPS Take 1 capsule by mouth daily before breakfast.  30 capsule  0    Allergies as of 01/22/2012  . (No Known Allergies)    Family History  Problem Relation Age of Onset  . Colon polyps Father   . Colon cancer Neg Hx     History   Social History  . Marital Status: Married    Spouse Name: N/A    Number of Children: N/A  . Years of Education: N/A   Social History Main Topics  . Smoking status: Never Smoker   . Smokeless tobacco: None  . Alcohol Use: No  . Drug Use: No  . Sexually Active: None   Other Topics Concern  . None   Social History Narrative  . None    Review of Systems: Gen: Denies fever, chills, anorexia. Denies fatigue, weakness, weight  loss.  CV: Denies chest pain, palpitations, syncope, peripheral edema, and claudication. Resp: Denies dyspnea at rest, cough, wheezing, coughing up blood, and pleurisy. GI: Denies vomiting blood, jaundice, and fecal incontinence.   Denies dysphagia or odynophagia. Derm: Denies rash, itching, dry skin Psych: Denies depression, anxiety, memory loss, confusion. No homicidal or suicidal ideation.  Heme: Denies bruising, bleeding, and enlarged lymph nodes.  Physical Exam: BP 138/83  Pulse 83  Temp(Src) 98.2 F (36.8 C) (Temporal)  Ht 5' 6.5" (1.689 m)  Wt 219 lb (99.338 kg)  BMI 34.82 kg/m2 General:   Alert and oriented. No distress noted. Pleasant and cooperative.  Head:   Normocephalic and atraumatic. Eyes:  Conjuctiva clear without scleral icterus. Neck:  Supple, without mass or thyromegaly. Heart:  S1, S2 present without murmurs, rubs, or gallops. Regular rate and rhythm. Abdomen:  +BS, soft, TTP epigastric and lower abdomen, and non-distended. No rebound or guarding. No HSM or masses noted. NEGATIVE CARNETT'S SIGN Msk:  Symmetrical without gross deformities. Normal posture. Extremities:  Without edema. Neurologic:  Alert and  oriented x4;  grossly normal neurologically. Skin:  Intact without significant lesions or rashes. Cervical Nodes:  No significant cervical adenopathy. Psych:  Alert and cooperative. Normal mood and affect.

## 2012-01-22 NOTE — Patient Instructions (Signed)
I will be getting the rest of the blood work from Dr. Sherwood Gambler.  In the meantime, stop Pepcid. Start taking Dexilant each morning. This is to help with reflux. We have provided you with samples to last about 2 weeks. After about 10 days, call us so we may fill a prescription if this does well for you.  Start taking Linzess 145 mcg each morning, 30 minutes before the first meal of the day. We have provided samples for this as well. If this helps with constipation, please call us so we may send in a complete prescription.   We will see you back in 6 weeks. If you have any worsening of your pain or any worrisome symptoms, please call us so we can see you sooner!

## 2012-01-26 ENCOUNTER — Encounter: Payer: Self-pay | Admitting: Gastroenterology

## 2012-01-26 ENCOUNTER — Telehealth: Payer: Self-pay | Admitting: Gastroenterology

## 2012-01-26 DIAGNOSIS — R1030 Lower abdominal pain, unspecified: Secondary | ICD-10-CM | POA: Insufficient documentation

## 2012-01-26 DIAGNOSIS — R109 Unspecified abdominal pain: Secondary | ICD-10-CM | POA: Insufficient documentation

## 2012-01-26 DIAGNOSIS — R1013 Epigastric pain: Secondary | ICD-10-CM | POA: Insufficient documentation

## 2012-01-26 DIAGNOSIS — K5900 Constipation, unspecified: Secondary | ICD-10-CM | POA: Insufficient documentation

## 2012-01-26 NOTE — Assessment & Plan Note (Signed)
Vague intermittent upper abdominal pain, no associated or alleviating factors. No N/V or bloating. Appetite waxes and wanes, down 3 lbs from last visit. Only taking Pepcid prn. Has failed Prilosec and Nexium. Trial of Protonix daily. Hx of PUD in 1999. CBC, CMP, Amylase, Lipase up-to-date and normal. Question underlying IBS. If continues to lose weight, continued dyspepsia despite adding Dexilant and addressing constipation, may need to pursue EGD due to dyspepsia. Pt aware. Return in 6 weeks for further evaluation.

## 2012-01-26 NOTE — Telephone Encounter (Signed)
After reviewing chart, would like to get urinalysis and culture due to vague symptoms reported.  Please inform pt. I have ordered these, thanks!

## 2012-01-26 NOTE — Assessment & Plan Note (Signed)
Start Linzess. Question if this is causing vague lower abdominal pain. See notes under abdominal pain.

## 2012-01-26 NOTE — Assessment & Plan Note (Addendum)
Continued lower abdominal pain, extends to bilateral groins at times, wraps around lower back, no fever, no rectal bleeding. TCS on file from Aug 2012 normal. BM every 3-4 days. No abnormalities noted on CBC, CMP. CT as above from Aug 2012 with small left renal cyst, increased in size since April 2011. Small cyst in right pelvis, suspect ovarian origin. Pt reports undergoing vaginal [place] to assess cyst. No recent UA, culture on file but pt overall denies overt urinary symptoms. Doubt left renal cyst causing discomfort but may need further evaluation (renal ultrasound?) will defer to PCP for this. Question untreated constipation causing discomfort.   Start Linzess 145 daily. Increase if necessary, samples provided, pt to call with report.  UA with culture Return in 6 weeks

## 2012-01-27 NOTE — Progress Notes (Signed)
Faxed to PCP

## 2012-01-27 NOTE — Telephone Encounter (Signed)
Pt aware, lab order faxed to lab. 

## 2012-01-28 LAB — URINALYSIS W MICROSCOPIC + REFLEX CULTURE
Leukocytes, UA: NEGATIVE
Protein, ur: NEGATIVE mg/dL
Urobilinogen, UA: 1 mg/dL (ref 0.0–1.0)

## 2012-02-06 ENCOUNTER — Other Ambulatory Visit: Payer: Self-pay | Admitting: Gastroenterology

## 2012-02-06 MED ORDER — CIPROFLOXACIN HCL 250 MG PO TABS: 250.0000 mg | ORAL_TABLET | Freq: Two times a day (BID) | ORAL | Status: AC

## 2012-02-06 NOTE — Progress Notes (Signed)
Quick Note:  Ok. Will give Cipro 250 mg po BID X 3 days. To PCP if further issues.  With Linzess: make sure taking 30 minutes before a meal. If continues to have diarrhea, can try every other day. I am not sure if this will help with symptoms. Could try Amitiza if this does not work. ______

## 2012-02-06 NOTE — Progress Notes (Signed)
Quick Note:  Pt aware, she said she is having some pain and urgency with urination. She would like abx called to [facility] pharmacy.   She is having some problems with the Linzess. She said when she takes it she will have multiple loose stools that are bad enough that she cannot leave the house for the day. She is only taking it prn. ______

## 2012-02-06 NOTE — Progress Notes (Signed)
Quick Note:  I must not have realized this was back. I was waiting on culture, but it appears not needed. Lots of bacteria in specimen, question contamination? Is she having urinary symptoms? If so, may just empirically treat.  Need to just have PR on pt. ______

## 2012-02-18 NOTE — Progress Notes (Signed)
REVIEWED.  

## 2012-03-04 ENCOUNTER — Encounter: Payer: Self-pay | Admitting: Gastroenterology

## 2012-03-04 ENCOUNTER — Ambulatory Visit (INDEPENDENT_AMBULATORY_CARE_PROVIDER_SITE_OTHER): Payer: BC Managed Care – PPO | Admitting: Gastroenterology

## 2012-03-04 VITALS — BP 144/86 | HR 83 | Temp 98.4°F | Ht 66.5 in | Wt 221.0 lb

## 2012-03-04 DIAGNOSIS — R1013 Epigastric pain: Secondary | ICD-10-CM

## 2012-03-04 DIAGNOSIS — R109 Unspecified abdominal pain: Secondary | ICD-10-CM

## 2012-03-04 DIAGNOSIS — R1032 Left lower quadrant pain: Secondary | ICD-10-CM

## 2012-03-04 DIAGNOSIS — K5900 Constipation, unspecified: Secondary | ICD-10-CM

## 2012-03-04 MED ORDER — DEXLANSOPRAZOLE 60 MG PO CPDR: 60.0000 mg | DELAYED_RELEASE_CAPSULE | Freq: Every day | ORAL | Status: DC

## 2012-03-04 MED ORDER — LUBIPROSTONE 24 MCG PO CAPS: 24.0000 ug | ORAL_CAPSULE | Freq: Two times a day (BID) | ORAL | Status: AC

## 2012-03-04 NOTE — Progress Notes (Signed)
FAXED TO PCP 

## 2012-03-04 NOTE — Progress Notes (Signed)
Primary Care Physician: Cassell Smiles., MD, MD  Primary Gastroenterologist:  Roetta Sessions, MD   Chief Complaint  Patient presents with  . Follow-up  . Abdominal Pain    HPI: Jenna Chavez is a 46 y.o. female here for ongoing abdominal pain. She continues to complain of epigastric pain and lower abd pain which radiates into her back at times. Tried Linzess every other day but stools still to loose and associated with abdominal cramping. When taking it still didn't seem to help pain, still having to strain. BM more regular the last week. Bilaterall lower abd pain into back. May wake up hurting. Epigastric pain. Burning quality in rlq this week. Ache in epig. Not realted to meals. Not related to movement. No h/o back issues. Appetite not good. Not having much heartburn, so taking Dexilant prn only.  See OV notes by Gerrit Halls, NP dated 06/13/11 and 01/22/12 for details of previous work-up.  Current Outpatient Prescriptions  Medication Sig Dispense Refill  . dexlansoprazole (DEXILANT) 60 MG capsule Take 1 capsule (60 mg total) by mouth daily.  14 capsule  0  . famotidine-calcium carbonate-magnesium hydroxide (PEPCID COMPLETE) 10-800-165 MG CHEW Chew 1 tablet by mouth daily as needed.  31 tablet  3  . HYDROcodone-acetaminophen (VICODIN) 5-500 MG per tablet Take 1 tablet by mouth every 6 (six) hours as needed.       . Linaclotide 145 MCG CAPS Take 1 capsule by mouth daily before breakfast.  30 capsule  0    Allergies as of 03/04/2012  . (No Known Allergies)    ROS:  General: Positive for anorexia. No weight loss, fever, chills, fatigue, weakness. ENT: Negative for hoarseness, difficulty swallowing , nasal congestion. CV: Negative for chest pain, angina, palpitations, dyspnea on exertion, peripheral edema.  Respiratory: Negative for dyspnea at rest, dyspnea on exertion, cough, sputum, wheezing.  GI: See history of present illness. GU:  Negative for dysuria, hematuria, urinary  incontinence, urinary frequency, nocturnal urination.  Endo: Negative for unusual weight change.    Physical Examination:   BP 144/86  Pulse 83  Temp(Src) 98.4 F (36.9 C) (Temporal)  Ht 5' 6.5" (1.689 m)  Wt 221 lb (100.245 kg)  BMI 35.14 kg/m2  General: Well-nourished, well-developed in no acute distress.  Eyes: No icterus. Mouth: Oropharyngeal mucosa moist and pink , no lesions erythema or exudate. Lungs: Clear to auscultation bilaterally.  Heart: Regular rate and rhythm, no murmurs rubs or gallops.  Abdomen: Bowel sounds are normal. Diffuse abd tenderness with moderate palpation. Nondistended, no hepatosplenomegaly or masses, no abdominal bruits or hernia , no rebound or guarding.  No CVA tenderness. No palpable back deformities or pain. Extremities: No lower extremity edema. No clubbing or deformities. Neuro: Alert and oriented x 4   Skin: Warm and dry, no jaundice.   Psych: Alert and cooperative, normal mood and affect.  Labs:  Lab Results  Component Value Date   WBC 9.6 01/04/2012   HGB 13.3 01/04/2012   HCT 40 01/04/2012   MCV 87.7 01/04/2012   PLT 292 07/31/2007   Lab Results  Component Value Date   ALT 11 01/03/2012   AST 11 01/03/2012   ALKPHOS 47 01/03/2012   BILITOT 0.3 01/03/2012   Lab Results  Component Value Date   CREATININE 0.93 01/03/2012   BUN 14 01/03/2012   NA 138 01/03/2012   K 3.9 01/03/2012   CL 106 01/03/2012   CO2 24 01/03/2012    Imaging Studies: No results found.  OLD CT from 05/2011: tiny umbilical hernia, small right pelvis cyst 2.8cm likely ovarian. ?wall thickening of ascending colon (normal on colonoscopy).

## 2012-03-04 NOTE — Patient Instructions (Signed)
Please take Dexilant 30 minutes before breakfast every day. Samples provided and RX sent to your pharmacy. Please take Amitiza once to twice daily WITH FOOD. Samples provided and RX sent to your pharmcy. Please call in 10 days with report of how you are doing. If no significant improvement, then we will schedule you for an upper endoscopy.

## 2012-03-04 NOTE — Assessment & Plan Note (Signed)
Persistent lower abdominal pain bilaterally and radiates into back at times. No alleviating factors. Not worse with meals or movement. No h/o back issues. If referred from back etiology, would not expect to cause increase abdominal pain with palpation. ?functional abdominal pain/IBS? She also has epigastric pain, diminished appetite but no weigh loss since last OV. ?dyspepsia.   Stop Linzess. Trial of Amitiza once to twice daily with food for 10 days. Take Dexilant daily for 10 days. Call with PR at that time. If no significant improvement in abdominal pain, consider EGD for further evaluation of epigastric pain given h/o PUD. She may ultimately need referral to [facility] for further evaluation.

## 2012-08-14 ENCOUNTER — Other Ambulatory Visit (HOSPITAL_COMMUNITY): Payer: Self-pay | Admitting: Internal Medicine

## 2012-08-14 DIAGNOSIS — Z139 Encounter for screening, unspecified: Secondary | ICD-10-CM

## 2012-09-28 ENCOUNTER — Ambulatory Visit (HOSPITAL_COMMUNITY)
Admission: RE | Admit: 2012-09-28 | Discharge: 2012-09-28 | Disposition: A | Discharge status: Home / Self Care | Payer: BC Managed Care – PPO | Source: Ambulatory Visit | Attending: Internal Medicine | Admitting: Internal Medicine

## 2012-09-28 DIAGNOSIS — Z1231 Encounter for screening mammogram for malignant neoplasm of breast: Secondary | ICD-10-CM | POA: Insufficient documentation

## 2012-09-28 DIAGNOSIS — Z139 Encounter for screening, unspecified: Secondary | ICD-10-CM

## 2013-05-12 ENCOUNTER — Emergency Department (HOSPITAL_COMMUNITY): Payer: BC Managed Care – PPO

## 2013-05-12 ENCOUNTER — Emergency Department (HOSPITAL_COMMUNITY)
Admission: EM | Admit: 2013-05-12 | Discharge: 2013-05-12 | Disposition: A | Discharge status: Home / Self Care | Payer: BC Managed Care – PPO | Attending: Emergency Medicine | Admitting: Emergency Medicine

## 2013-05-12 DIAGNOSIS — R110 Nausea: Secondary | ICD-10-CM | POA: Insufficient documentation

## 2013-05-12 DIAGNOSIS — R209 Unspecified disturbances of skin sensation: Secondary | ICD-10-CM | POA: Insufficient documentation

## 2013-05-12 DIAGNOSIS — R1031 Right lower quadrant pain: Secondary | ICD-10-CM

## 2013-05-12 DIAGNOSIS — M79609 Pain in unspecified limb: Secondary | ICD-10-CM | POA: Insufficient documentation

## 2013-05-12 DIAGNOSIS — G8929 Other chronic pain: Secondary | ICD-10-CM | POA: Insufficient documentation

## 2013-05-12 DIAGNOSIS — M549 Dorsalgia, unspecified: Secondary | ICD-10-CM | POA: Insufficient documentation

## 2013-05-12 DIAGNOSIS — Z8711 Personal history of peptic ulcer disease: Secondary | ICD-10-CM | POA: Insufficient documentation

## 2013-05-12 DIAGNOSIS — M25559 Pain in unspecified hip: Secondary | ICD-10-CM | POA: Insufficient documentation

## 2013-05-12 DIAGNOSIS — M25552 Pain in left hip: Secondary | ICD-10-CM

## 2013-05-12 DIAGNOSIS — R109 Unspecified abdominal pain: Secondary | ICD-10-CM | POA: Insufficient documentation

## 2013-05-12 DIAGNOSIS — Z8719 Personal history of other diseases of the digestive system: Secondary | ICD-10-CM | POA: Insufficient documentation

## 2013-05-12 DIAGNOSIS — Z862 Personal history of diseases of the blood and blood-forming organs and certain disorders involving the immune mechanism: Secondary | ICD-10-CM | POA: Insufficient documentation

## 2013-05-12 DIAGNOSIS — Z9889 Other specified postprocedural states: Secondary | ICD-10-CM | POA: Insufficient documentation

## 2013-05-12 MED ORDER — ONDANSETRON 4 MG PO TBDP
4.0000 mg | ORAL_TABLET | Freq: Once | ORAL | Status: AC
  Administered 2013-05-12: 4 mg via ORAL
  Filled 2013-05-12: qty 1

## 2013-05-12 MED ORDER — HYDROMORPHONE HCL PF 2 MG/ML IJ SOLN
2.0000 mg | Freq: Once | INTRAMUSCULAR | Status: AC
  Administered 2013-05-12: 2 mg via INTRAMUSCULAR
  Filled 2013-05-12: qty 1

## 2013-05-12 MED ORDER — HYDROCODONE-ACETAMINOPHEN 5-325 MG PO TABS: 1.0000 | ORAL_TABLET | Freq: Four times a day (QID) | ORAL | Status: DC | PRN

## 2013-05-12 NOTE — ED Notes (Signed)
Patient transported to X-ray 

## 2013-05-12 NOTE — ED Notes (Signed)
State that she started having left groin pain yesterday and now the pain is radiating down her left leg, states that her left leg feels numb.  States that she was having chest pain this morning that was sharp in nature, states that she did have some nausea with the chest pain.  States that she did feel like her heart was fluttering this morning, states that the chest pain is currently resolved.

## 2013-05-12 NOTE — ED Notes (Signed)
Left in c/o husband for transport home; instructions, prescriptions and f/u information given/reviewed - verbalizes understanding. A&ox4; in no distress.

## 2013-05-12 NOTE — ED Provider Notes (Signed)
History  This chart was scribed for Jenna Jakes, MD by Bennett Scrape, ED Scribe. This patient was seen in room APA17/AP[facility]17 and the patient's care was started at 9:28 AM.  CSN: [id number] Arrival date & time 05/12/13  0827  First MD Initiated Contact with Patient 05/12/13 [phone]     Chief Complaint  Patient presents with  . Groin Pain  . Leg Pain    Patient is a 47 y.o. female presenting with groin pain. The history is provided by the patient. No language interpreter was used.  Groin Pain This is a recurrent problem. The current episode started yesterday. The problem occurs constantly. The problem has not changed since onset.Pertinent negatives include no chest pain, no abdominal pain, no headaches and no shortness of breath. The symptoms are aggravated by walking. Nothing relieves the symptoms. She has tried nothing for the symptoms.    HPI Comments: Jenna Chavez is a 47 y.o. female who presents to the Emergency Department complaining of left groin pain that radiates down to the left foot around 4 PM yesterday. She reports that the pain is a tightness with associated numbness that is felt anteriorly and posteriorly. She reports that the pain started suddenly while standing and she has been unable to ambulate since secondary to pain. She rates her pain a 9 out of 10 and reports that the pain is also aggravated by movement of the left hip. Pt denies taking OTC medications at home to improve symptoms. She admits to experiencing one prior episode 3 months ago that resolved on its own. She states that she "hopped around" on the leg until the pain resolved. She denies following up with her PCP for the symptoms. She denies any involvement with the right leg or prior injuries to the left leg.    Pt denies CP contrary to triage nursing note.  PCP is Dr. Sherwood Gambler  Past Medical History  Diagnosis Date  . Glucose intolerance (impaired glucose tolerance)   . GERD (gastroesophageal reflux  disease)   . PUD (peptic ulcer disease)     Dr. Jena Gauss: egd 1999  . Anemia     2008  . Constipation   . S/P endoscopy March 1999    Dr. Jena Gauss: normal EGD, healed gastric ulcer  . S/P endoscopy Jan 1999    gastric ulcer, negative malignancy   Past Surgical History  Procedure Laterality Date  . Cholecystectomy    . Partial hysterectomy    . Oophorectomy      and fallopian tube on left side  . Right toe surgery    . Colonoscopy  06/21/2011    RMR: normal rectum, colon, TI, repeat Aug 2017  . Esophagogastroduodenoscopy  06/21/2011    Planned but not done at time of TCS, no need   Family History  Problem Relation Age of Onset  . Colon polyps Father   . Colon cancer Neg Hx    History  Substance Use Topics  . Smoking status: Never Smoker   . Smokeless tobacco: Not on file  . Alcohol Use: No   No OB history provided.   Review of Systems  Constitutional: Negative for fever and chills.  HENT: Negative for congestion, sore throat and neck pain.   Eyes: Negative for visual disturbance.  Respiratory: Negative for cough and shortness of breath.   Cardiovascular: Negative for chest pain and leg swelling.  Gastrointestinal: Positive for nausea. Negative for vomiting, abdominal pain and diarrhea.  Genitourinary: Negative for dysuria.  Musculoskeletal: Positive  for back pain (chronic, unrelated to the CC) and arthralgias.  Skin: Negative for rash.  Neurological: Negative for headaches.  Hematological: Does not bruise/bleed easily.  Psychiatric/Behavioral: Negative for confusion.    Allergies  Review of patient's allergies indicates no known allergies.  Home Medications   Current Outpatient Rx  Name  Route  Sig  Dispense  Refill  . Boric Acid POWD   Vaginal   Place 600 mg vaginally once a week. On Tuesday         . HYDROcodone-acetaminophen (NORCO/VICODIN) 5-325 MG per tablet   Oral   Take 1-2 tablets by mouth every 6 (six) hours as needed for pain.   20 tablet   0     Triage Vitals: BP 149/93  Pulse 81  Temp(Src) 98 F (36.7 C)  Resp 18  Wt 183 lb (83.008 kg)  BMI 29.1 kg/m2  SpO2 100%  Physical Exam  Nursing note and vitals reviewed. Constitutional: She is oriented to person, place, and time. She appears well-developed and well-nourished. No distress.  HENT:  Head: Normocephalic and atraumatic.  Mouth/Throat: Oropharynx is clear and moist.  Eyes: Conjunctivae and EOM are normal. Pupils are equal, round, and reactive to light.  Sclera are clear  Neck: Neck supple. No tracheal deviation present.  Cardiovascular: Normal rate and regular rhythm.   No murmur heard. Pulses:      Dorsalis pedis pulses are 2+ on the right side, and 2+ on the left side.  Pulmonary/Chest: Effort normal and breath sounds normal. No respiratory distress. She has no wheezes.  Abdominal: Soft. Bowel sounds are normal. She exhibits no distension. There is no tenderness.  Musculoskeletal: Normal range of motion. She exhibits no edema (no ankle swelling).  No effusion to knee joints, no lumbar tenderness to palpation, no left groin masses, no left inguinal adenopathy, no tenderness to the left hip with palpation  Lymphadenopathy:    She has no cervical adenopathy.       Left: No inguinal adenopathy present.  Neurological: She is alert and oriented to person, place, and time. No cranial nerve deficit.  Pt able to move both sets of fingers and toes  Skin: Skin is warm and dry. No rash noted.  Psychiatric: She has a normal mood and affect. Her behavior is normal.    ED Course  Procedures (including critical care time)  Medications  HYDROmorphone (DILAUDID) injection 2 mg (2 mg Intramuscular Given 05/12/13 0957)  ondansetron (ZOFRAN-ODT) disintegrating tablet 4 mg (4 mg Oral Given 05/12/13 0954)    DIAGNOSTIC STUDIES: Oxygen Saturation is 100% on room air, normal by my interpretation.    COORDINATION OF CARE: 9:38 AM-Discussed treatment plan which includes medications  and bilateral hip xray with pt at bedside and pt agreed to plan. Advised pt that she will need to f/u with her PCP for further testing.  Labs Reviewed - No data to display  Dg Hip Bilateral W/pelvis  05/12/2013   *RADIOLOGY REPORT*  Clinical Data: Left groin pain with left leg numbness.  BILATERAL HIP WITH PELVIS - 4+ VIEW  Comparison: CT abdomen and pelvis 06/04/2011.  Findings: Imaged bones, joints and soft tissues appear normal.  IMPRESSION: Negative exam.   Original Report Authenticated By: Holley Dexter, M.D.    1. Hip pain, acute, left   2. Deep groin pain, right     MDM  Patient with acute onset yesterday of left chronic left hip pain worse with movement of that joint no direct injury. X-rays today negative  for any bony abnormality. Will refer patient to orthopedics and back to the primary care Dr. for further workup. Clinically seems to be musculoskeletal in nature. No other arthritic kind of symptoms or joints involved. Will treat with pain medication. Patient improved here in the emergency part.  I personally performed the services described in this documentation, which was scribed in my presence. The recorded information has been reviewed and is accurate.     Jenna Jakes, MD 05/12/13 1128

## 2013-05-18 ENCOUNTER — Other Ambulatory Visit (HOSPITAL_COMMUNITY): Payer: Self-pay | Admitting: Internal Medicine

## 2013-05-18 DIAGNOSIS — M549 Dorsalgia, unspecified: Secondary | ICD-10-CM

## 2013-05-20 ENCOUNTER — Ambulatory Visit (HOSPITAL_COMMUNITY)
Admission: RE | Admit: 2013-05-20 | Discharge: 2013-05-20 | Disposition: A | Discharge status: Home / Self Care | Payer: BC Managed Care – PPO | Source: Ambulatory Visit | Attending: Internal Medicine | Admitting: Internal Medicine

## 2013-05-20 DIAGNOSIS — M51379 Other intervertebral disc degeneration, lumbosacral region without mention of lumbar back pain or lower extremity pain: Secondary | ICD-10-CM | POA: Insufficient documentation

## 2013-05-20 DIAGNOSIS — M545 Low back pain, unspecified: Secondary | ICD-10-CM | POA: Insufficient documentation

## 2013-05-20 DIAGNOSIS — M79609 Pain in unspecified limb: Secondary | ICD-10-CM | POA: Insufficient documentation

## 2013-05-20 DIAGNOSIS — M549 Dorsalgia, unspecified: Secondary | ICD-10-CM

## 2013-05-20 DIAGNOSIS — M5137 Other intervertebral disc degeneration, lumbosacral region: Secondary | ICD-10-CM | POA: Insufficient documentation

## 2013-05-20 DIAGNOSIS — M5126 Other intervertebral disc displacement, lumbar region: Secondary | ICD-10-CM | POA: Insufficient documentation

## 2013-05-20 DIAGNOSIS — R1032 Left lower quadrant pain: Secondary | ICD-10-CM | POA: Insufficient documentation

## 2013-05-25 ENCOUNTER — Other Ambulatory Visit (HOSPITAL_COMMUNITY): Payer: Self-pay | Admitting: Internal Medicine

## 2013-05-25 DIAGNOSIS — M549 Dorsalgia, unspecified: Secondary | ICD-10-CM

## 2013-05-27 ENCOUNTER — Ambulatory Visit (HOSPITAL_COMMUNITY): Payer: BC Managed Care – PPO

## 2013-08-23 ENCOUNTER — Other Ambulatory Visit (HOSPITAL_COMMUNITY): Payer: Self-pay | Admitting: Internal Medicine

## 2013-08-23 DIAGNOSIS — Z139 Encounter for screening, unspecified: Secondary | ICD-10-CM

## 2013-09-30 ENCOUNTER — Ambulatory Visit (HOSPITAL_COMMUNITY)
Admission: RE | Admit: 2013-09-30 | Discharge: 2013-09-30 | Disposition: A | Discharge status: Home / Self Care | Payer: BC Managed Care – PPO | Source: Ambulatory Visit | Attending: Internal Medicine | Admitting: Internal Medicine

## 2013-09-30 DIAGNOSIS — Z139 Encounter for screening, unspecified: Secondary | ICD-10-CM

## 2013-09-30 DIAGNOSIS — Z1231 Encounter for screening mammogram for malignant neoplasm of breast: Secondary | ICD-10-CM | POA: Insufficient documentation

## 2014-04-25 DIAGNOSIS — Z8249 Family history of ischemic heart disease and other diseases of the circulatory system: Secondary | ICD-10-CM | POA: Insufficient documentation

## 2014-08-25 ENCOUNTER — Other Ambulatory Visit (HOSPITAL_COMMUNITY): Payer: Self-pay | Admitting: Obstetrics and Gynecology

## 2014-08-25 ENCOUNTER — Other Ambulatory Visit: Payer: Self-pay | Admitting: Obstetrics and Gynecology

## 2014-08-25 DIAGNOSIS — Z1231 Encounter for screening mammogram for malignant neoplasm of breast: Secondary | ICD-10-CM

## 2014-08-26 LAB — CYTOLOGY - PAP

## 2014-10-05 ENCOUNTER — Ambulatory Visit (HOSPITAL_COMMUNITY)
Admission: RE | Admit: 2014-10-05 | Discharge: 2014-10-05 | Disposition: A | Discharge status: Home / Self Care | Payer: BC Managed Care – PPO | Source: Ambulatory Visit | Attending: Obstetrics and Gynecology | Admitting: Obstetrics and Gynecology

## 2014-10-05 DIAGNOSIS — Z1231 Encounter for screening mammogram for malignant neoplasm of breast: Secondary | ICD-10-CM | POA: Diagnosis present

## 2015-08-28 ENCOUNTER — Other Ambulatory Visit (HOSPITAL_COMMUNITY): Payer: Self-pay | Admitting: Obstetrics and Gynecology

## 2015-08-28 DIAGNOSIS — Z1231 Encounter for screening mammogram for malignant neoplasm of breast: Secondary | ICD-10-CM

## 2015-10-09 ENCOUNTER — Ambulatory Visit (HOSPITAL_COMMUNITY)
Admission: RE | Admit: 2015-10-09 | Discharge: 2015-10-09 | Disposition: A | Discharge status: Home / Self Care | Payer: BLUE CROSS/BLUE SHIELD | Source: Ambulatory Visit | Attending: Obstetrics and Gynecology | Admitting: Obstetrics and Gynecology

## 2015-10-09 DIAGNOSIS — Z1231 Encounter for screening mammogram for malignant neoplasm of breast: Secondary | ICD-10-CM | POA: Diagnosis present

## 2016-05-23 ENCOUNTER — Encounter: Payer: Self-pay | Admitting: Internal Medicine

## 2016-09-02 ENCOUNTER — Other Ambulatory Visit (HOSPITAL_COMMUNITY): Payer: Self-pay | Admitting: Physician Assistant

## 2016-09-02 DIAGNOSIS — Z1231 Encounter for screening mammogram for malignant neoplasm of breast: Secondary | ICD-10-CM

## 2016-10-09 ENCOUNTER — Ambulatory Visit (HOSPITAL_COMMUNITY)
Admission: RE | Admit: 2016-10-09 | Discharge: 2016-10-09 | Disposition: A | Discharge status: Home / Self Care | Payer: BLUE CROSS/BLUE SHIELD | Source: Ambulatory Visit | Attending: Obstetrics and Gynecology | Admitting: Obstetrics and Gynecology

## 2016-10-09 ENCOUNTER — Other Ambulatory Visit (HOSPITAL_COMMUNITY): Payer: Self-pay | Admitting: Obstetrics and Gynecology

## 2016-10-09 DIAGNOSIS — Z1231 Encounter for screening mammogram for malignant neoplasm of breast: Secondary | ICD-10-CM | POA: Diagnosis not present

## 2017-01-17 ENCOUNTER — Other Ambulatory Visit (HOSPITAL_COMMUNITY): Payer: Self-pay | Admitting: Internal Medicine

## 2017-01-17 DIAGNOSIS — R109 Unspecified abdominal pain: Secondary | ICD-10-CM

## 2017-01-24 ENCOUNTER — Other Ambulatory Visit (HOSPITAL_COMMUNITY): Payer: Self-pay | Admitting: Internal Medicine

## 2017-01-24 DIAGNOSIS — R079 Chest pain, unspecified: Secondary | ICD-10-CM

## 2017-01-30 ENCOUNTER — Ambulatory Visit (HOSPITAL_COMMUNITY): Payer: BLUE CROSS/BLUE SHIELD

## 2017-01-31 ENCOUNTER — Ambulatory Visit (HOSPITAL_COMMUNITY)
Admission: RE | Admit: 2017-01-31 | Discharge: 2017-01-31 | Disposition: A | Discharge status: Home / Self Care | Payer: BLUE CROSS/BLUE SHIELD | Source: Ambulatory Visit | Attending: Internal Medicine | Admitting: Internal Medicine

## 2017-01-31 ENCOUNTER — Ambulatory Visit (HOSPITAL_COMMUNITY): Payer: BLUE CROSS/BLUE SHIELD

## 2017-01-31 DIAGNOSIS — R079 Chest pain, unspecified: Secondary | ICD-10-CM

## 2017-01-31 DIAGNOSIS — R109 Unspecified abdominal pain: Secondary | ICD-10-CM | POA: Insufficient documentation

## 2017-01-31 MED ORDER — IOPAMIDOL (ISOVUE-370) INJECTION 76%
100.0000 mL | Freq: Once | INTRAVENOUS | Status: AC | PRN
  Administered 2017-01-31: 100 mL via INTRAVENOUS

## 2017-02-17 ENCOUNTER — Encounter: Payer: Self-pay | Admitting: Internal Medicine

## 2017-03-06 ENCOUNTER — Other Ambulatory Visit: Payer: Self-pay

## 2017-03-06 ENCOUNTER — Ambulatory Visit (INDEPENDENT_AMBULATORY_CARE_PROVIDER_SITE_OTHER): Payer: BLUE CROSS/BLUE SHIELD | Admitting: Nurse Practitioner

## 2017-03-06 ENCOUNTER — Encounter: Payer: Self-pay | Admitting: Nurse Practitioner

## 2017-03-06 VITALS — BP 152/92 | HR 76 | Temp 97.8°F | Ht 66.5 in | Wt 215.0 lb

## 2017-03-06 DIAGNOSIS — K5900 Constipation, unspecified: Secondary | ICD-10-CM | POA: Diagnosis not present

## 2017-03-06 DIAGNOSIS — R109 Unspecified abdominal pain: Secondary | ICD-10-CM

## 2017-03-06 DIAGNOSIS — Z8371 Family history of colonic polyps: Secondary | ICD-10-CM

## 2017-03-06 DIAGNOSIS — Z83719 Family history of colon polyps, unspecified: Secondary | ICD-10-CM

## 2017-03-06 DIAGNOSIS — R1084 Generalized abdominal pain: Secondary | ICD-10-CM

## 2017-03-06 MED ORDER — PEG 3350-KCL-NA BICARB-NACL 420 G PO SOLR: 4000.0000 mL | ORAL | 0 refills | Status: DC

## 2017-03-06 NOTE — Patient Instructions (Signed)
1. We will schedule your procedure for you. 2. Further recommendations to be made after your procedure. 3. Start taking Linzess 72 g once a day. 4. I will provide you with samples to last 2 weeks. Call us in 1 week and let us know if it is helping. 5. Return for follow-up in 3 months. 6. Call us if you have any worsening symptoms or severe symptoms before then.

## 2017-03-06 NOTE — Progress Notes (Signed)
Primary Care Physician:  Redmond School, MD Primary Gastroenterologist:  Dr. Gala Romney  Chief Complaint  Patient presents with  . Abdominal Pain    HPI:   Jenna Chavez is a 51 y.o. female who presents on referral from primary care for abdominal pain. Patient previously saw digestive health specialists at [facility]. She was last seen by them in 2014 at which point she was noted to have a history of abdominal pain and constipation. Initially seen for chronic abdominal pain in 20 years of constipation. She was started on MiraLAX and arranged for anorectal manometry. Her manometry demonstrated type II dyssynergia. She was offered biofeedback therapy but noted to improve significantly on MiraLAX and with resolution of abdominal pain.  PCP notes reviewed. Last saw primary care on 01/13/2017 for back and side pain which she stated had been occurring for 2 weeks, left upper quadrant radiating to her back and getting worse. CT of the abdomen was ordered which found no acute focal pathology to explain pain, no evidence of aortic dissection, no pulmonary embolus, negative CTA test, no acute or focal pathology in the abdomen, mild degenerative changes in the lower lumbar spine.  Last colonoscopy completed 06/21/2011 for high risk with a positive family history of colon polyps in a first-degree relative at a young age. Colonoscopy was essentially normal. Recommended 5 year repeat exam, is currently due.   EGD completed in 1999 found normal EGD, previously noted gastric ulcer has healed. Recommended complete PPI, refrain from NSAIDs.  Today she states she's doing ok overall. Confirms above history. Pain started about 2 months ago on the right side and radiated around to the back. Then she had pain on the left side also radiates to the back. Has daily pain; starts dull, turns sharp, and radiates to her back. UA was normal at PCP. She has intermittent constipation dependent on diet and water  intake but sometimes needs to have an enema. Also wih some weight gain and bloating. Typically with a bowel movement three times a week and about a third of her bowel movements are hard and require straining. Typically doesn't feel like she empties completely. No constipation measures other than enemas. Had some hematochezia last week for one occurrence after enema. Denies melena, GERD symptoms, unintentional weight loss, fever, chills. Denies chest pain, dyspnea, dizziness, lightheadedness, syncope, near syncope. Denies any other upper or lower GI symptoms.  Past Medical History:  Diagnosis Date  . Anemia    2008  . Constipation   . GERD (gastroesophageal reflux disease)   . Glucose intolerance (impaired glucose tolerance)   . PUD (peptic ulcer disease)    Dr. Gala Romney: egd 1999  . S/P endoscopy March 1999   Dr. Gala Romney: normal EGD, healed gastric ulcer  . S/P endoscopy Jan 1999   gastric ulcer, negative malignancy    Past Surgical History:  Procedure Laterality Date  . CHOLECYSTECTOMY    . COLONOSCOPY  06/21/2011   RMR: normal rectum, colon, TI, repeat Aug 2017  . ESOPHAGOGASTRODUODENOSCOPY  06/21/2011   Planned but not done at time of TCS, no need  . OOPHORECTOMY     and fallopian tube on left side  . PARTIAL HYSTERECTOMY    . right toe surgery      No current outpatient prescriptions on file.   No current facility-administered medications for this visit.     Allergies as of 03/06/2017  . (No Known Allergies)    Family History  Problem Relation Age of  Onset  . Colon polyps Father   . Colon cancer Neg Hx     Social History   Social History  . Marital status: Married    Spouse name: N/A  . Number of children: N/A  . Years of education: N/A   Occupational History  . Not on file.   Social History Main Topics  . Smoking status: Never Smoker  . Smokeless tobacco: Never Used  . Alcohol use No  . Drug use: No  . Sexual activity: Not on file   Other Topics Concern    . Not on file   Social History Narrative  . No narrative on file    Review of Systems: General: Negative for anorexia, weight loss, fever, chills, fatigue, weakness. ENT: Negative for hoarseness, difficulty swallowing. CV: Negative for chest pain, angina, palpitations, peripheral edema.  Respiratory: Negative for dyspnea at rest, cough, sputum, wheezing.  GI: See history of present illness. MS: Negative for joint pain, low back pain.  Derm: Negative for rash or itching.  Endo: Negative for unusual weight change.  Heme: Negative for bruising or bleeding. Allergy: Negative for rash or hives.    Physical Exam: BP (!) 152/92   Pulse 76   Temp 97.8 F (36.6 C) (Oral)   Ht 5' 6.5" (1.689 m)   Wt 215 lb (97.5 kg)   BMI 34.18 kg/m  General:   Obese female. Alert and oriented. Pleasant and cooperative. Well-nourished and well-developed.  Head:  Normocephalic and atraumatic. Eyes:  Without icterus, sclera clear and conjunctiva pink.  Ears:  Normal auditory acuity. Cardiovascular:  S1, S2 present without murmurs appreciated. Extremities without clubbing or edema. Respiratory:  Clear to auscultation bilaterally. No wheezes, rales, or rhonchi. No distress.  Gastrointestinal:  +BS, soft, and non-distended. Mild to moderate TTP generalized abdomen. No HSM noted. No guarding or rebound. No masses appreciated.  Rectal:  Deferred  Musculoskalatal:  Symmetrical without gross deformities. Neurologic:  Alert and oriented x4;  grossly normal neurologically. Psych:  Alert and cooperative. Normal mood and affect. Heme/Lymph/Immune: No excessive bruising noted.    03/06/2017 10:42 AM   Disclaimer: This note was dictated with voice recognition software. Similar sounding words can inadvertently be transcribed and may not be corrected upon review.

## 2017-03-06 NOTE — Assessment & Plan Note (Signed)
The patient has abdominal pain on bilateral sides which radiates to her back. She has a history of significant constipation and has been having a lot of incomplete emptying and a bowel movement every 2-3 days. Constipation is contributing to her symptoms at least in part and further constipation management as per above. Also of note, she is high risk colon cancer given primary relative multiple polyps at an early age and her father. Her last colonoscopy was in 2012 and recommended repeat and 5 years. She is currently due. We will proceed with colonoscopy as previously recommended. Return for follow-up in 3 months.  Proceed with TCS with Dr. Gala Romney in near future: the risks, benefits, and alternatives have been discussed with the patient in detail. The patient states understanding and desires to proceed.  The patient is not currently on any medications. Denies alcohol and drug use. Conscious sedation should be adequate for her procedure.

## 2017-03-06 NOTE — Assessment & Plan Note (Signed)
Long-standing history of constipation. Not currently on any treatments. She is willing to try Linzess again. I feel that given her incomplete emptying, and frequent bowel movements that her abdominal pain is contributed to at least a portion by her constipation. I will start her on Linzess 72 g once a day. Discussion on adverse effects was broached. I will provide samples to last 2 weeks, request progress report in one week. Return for follow-up in 3 months.

## 2017-03-10 NOTE — Progress Notes (Signed)
cc'ed to pcp °

## 2017-04-10 ENCOUNTER — Encounter (HOSPITAL_COMMUNITY): Payer: Self-pay | Admitting: *Deleted

## 2017-04-10 ENCOUNTER — Ambulatory Visit (HOSPITAL_COMMUNITY)
Admission: RE | Admit: 2017-04-10 | Discharge: 2017-04-10 | Disposition: A | Discharge status: Home / Self Care | Payer: BLUE CROSS/BLUE SHIELD | Source: Ambulatory Visit | Attending: Internal Medicine | Admitting: Internal Medicine

## 2017-04-10 ENCOUNTER — Encounter (HOSPITAL_COMMUNITY)
Admission: RE | Disposition: A | Discharge status: Home / Self Care | Payer: Self-pay | Source: Ambulatory Visit | Attending: Internal Medicine

## 2017-04-10 DIAGNOSIS — Z79899 Other long term (current) drug therapy: Secondary | ICD-10-CM | POA: Insufficient documentation

## 2017-04-10 DIAGNOSIS — D649 Anemia, unspecified: Secondary | ICD-10-CM | POA: Insufficient documentation

## 2017-04-10 DIAGNOSIS — K219 Gastro-esophageal reflux disease without esophagitis: Secondary | ICD-10-CM | POA: Insufficient documentation

## 2017-04-10 DIAGNOSIS — E7439 Other disorders of intestinal carbohydrate absorption: Secondary | ICD-10-CM | POA: Insufficient documentation

## 2017-04-10 DIAGNOSIS — Z90721 Acquired absence of ovaries, unilateral: Secondary | ICD-10-CM | POA: Diagnosis not present

## 2017-04-10 DIAGNOSIS — Z9049 Acquired absence of other specified parts of digestive tract: Secondary | ICD-10-CM | POA: Diagnosis not present

## 2017-04-10 DIAGNOSIS — Z1212 Encounter for screening for malignant neoplasm of rectum: Secondary | ICD-10-CM

## 2017-04-10 DIAGNOSIS — R109 Unspecified abdominal pain: Secondary | ICD-10-CM

## 2017-04-10 DIAGNOSIS — Z8719 Personal history of other diseases of the digestive system: Secondary | ICD-10-CM | POA: Diagnosis not present

## 2017-04-10 DIAGNOSIS — K5909 Other constipation: Secondary | ICD-10-CM | POA: Insufficient documentation

## 2017-04-10 DIAGNOSIS — Z8711 Personal history of peptic ulcer disease: Secondary | ICD-10-CM | POA: Diagnosis not present

## 2017-04-10 DIAGNOSIS — Z90710 Acquired absence of both cervix and uterus: Secondary | ICD-10-CM | POA: Diagnosis not present

## 2017-04-10 DIAGNOSIS — Z8371 Family history of colonic polyps: Secondary | ICD-10-CM | POA: Insufficient documentation

## 2017-04-10 DIAGNOSIS — Z800 Family history of malignant neoplasm of digestive organs: Secondary | ICD-10-CM

## 2017-04-10 DIAGNOSIS — K5900 Constipation, unspecified: Secondary | ICD-10-CM

## 2017-04-10 DIAGNOSIS — Z83719 Family history of colon polyps, unspecified: Secondary | ICD-10-CM

## 2017-04-10 DIAGNOSIS — Z1211 Encounter for screening for malignant neoplasm of colon: Secondary | ICD-10-CM

## 2017-04-10 HISTORY — PX: COLONOSCOPY: SHX5424

## 2017-04-10 SURGERY — COLONOSCOPY
Anesthesia: Moderate Sedation

## 2017-04-10 MED ORDER — MIDAZOLAM HCL 5 MG/5ML IJ SOLN
INTRAMUSCULAR | Status: DC | PRN
  Administered 2017-04-10 (×2): 2 mg via INTRAVENOUS
  Administered 2017-04-10: 1 mg via INTRAVENOUS
  Administered 2017-04-10: 2 mg via INTRAVENOUS

## 2017-04-10 MED ORDER — MEPERIDINE HCL 100 MG/ML IJ SOLN
INTRAMUSCULAR | Status: DC | PRN
  Administered 2017-04-10: 25 mg via INTRAVENOUS
  Administered 2017-04-10 (×2): 50 mg via INTRAVENOUS

## 2017-04-10 MED ORDER — ONDANSETRON HCL 4 MG/2ML IJ SOLN
INTRAMUSCULAR | Status: DC | PRN
  Administered 2017-04-10: 4 mg via INTRAVENOUS

## 2017-04-10 MED ORDER — MEPERIDINE HCL 100 MG/ML IJ SOLN
INTRAMUSCULAR | Status: AC
  Filled 2017-04-10: qty 2

## 2017-04-10 MED ORDER — PROMETHAZINE HCL 25 MG/ML IJ SOLN
INTRAMUSCULAR | Status: DC | PRN
  Administered 2017-04-10: 12.5 mg via INTRAVENOUS

## 2017-04-10 MED ORDER — MIDAZOLAM HCL 5 MG/5ML IJ SOLN
INTRAMUSCULAR | Status: AC
  Filled 2017-04-10: qty 10

## 2017-04-10 MED ORDER — PROMETHAZINE HCL 25 MG/ML IJ SOLN
INTRAMUSCULAR | Status: AC
  Filled 2017-04-10: qty 1

## 2017-04-10 MED ORDER — ONDANSETRON HCL 4 MG/2ML IJ SOLN
INTRAMUSCULAR | Status: AC
  Filled 2017-04-10: qty 2

## 2017-04-10 MED ORDER — SODIUM CHLORIDE 0.9 % IV SOLN
INTRAVENOUS | Status: DC
  Administered 2017-04-10: 13:00:00 via INTRAVENOUS

## 2017-04-10 NOTE — Op Note (Signed)
[facility] Patient Name: Jenna Chavez Procedure Date: 04/10/2017 12:42 PM MRN: [id number] Date of Birth: [birth year] Attending MD: Norvel Richards , MD CSN: [id number] Age: 51 Admit Type: Outpatient Procedure:                Colonoscopy Indications:              Colon cancer screening in patient at increased                            risk: Family history of 1st-degree relative with                            colon polyps Providers:                Norvel Richards, MD, Janeece Riggers, RN, Randa Spike, Technician Referring MD:              Medicines:                Midazolam 7 mg IV, Meperidine 867 mg IV Complications:            No immediate complications. Estimated Blood Loss:     Estimated blood loss: none. Procedure:                Pre-Anesthesia Assessment:                           - Prior to the procedure, a History and Physical                            was performed, and patient medications and                            allergies were reviewed. The patient's tolerance of                            previous anesthesia was also reviewed. The risks                            and benefits of the procedure and the sedation                            options and risks were discussed with the patient.                            All questions were answered, and informed consent                            was obtained. Prior Anticoagulants: The patient has                            taken no previous anticoagulant or antiplatelet  agents. ASA Grade Assessment: II - A patient with                            mild systemic disease. After reviewing the risks                            and benefits, the patient was deemed in                            satisfactory condition to undergo the procedure.                           After obtaining informed consent, the colonoscope                            was passed under  direct vision. Throughout the                            procedure, the patient's blood pressure, pulse, and                            oxygen saturations were monitored continuously. The                            EC-[id number] ([id number]) scope was introduced through                            the anus and advanced to the the cecum, identified                            by appendiceal orifice and ileocecal valve. The                            ileocecal valve, appendiceal orifice, and rectum                            were photographed. The entire colon was well                            visualized. The ileocecal valve, appendiceal                            orifice, and rectum were photographed. The                            colonoscopy was performed without difficulty. The                            patient tolerated the procedure well. The quality                            of the bowel preparation was adequate. Scope In: 1:18:53 PM Scope Out: 1:32:02 PM Scope Withdrawal Time: 0 hours 6 minutes 52 seconds  Total Procedure Duration:  0 hours 13 minutes 9 seconds  Findings:      The perianal and digital rectal examinations were normal.      The colon (entire examined portion) appeared normal.      The exam was otherwise without abnormality on direct and retroflexion       views. Impression:               - The entire examined colon is normal.                           - The examination was otherwise normal on direct                            and retroflexion views.                           - No specimens collected. Moderate Sedation:      Moderate (conscious) sedation was administered by the endoscopy nurse       and supervised by the endoscopist. The following parameters were       monitored: oxygen saturation, heart rate, blood pressure, respiratory       rate, EKG, adequacy of pulmonary ventilation, and response to care.       Total physician intraservice time was 30  minutes. Recommendation:           - Patient has a contact number available for                            emergencies. The signs and symptoms of potential                            delayed complications were discussed with the                            patient. Return to normal activities tomorrow.                            Written discharge instructions were provided to the                            patient.                           - Resume previous diet.                           - Continue present medications.                           - Repeat colonoscopy in 5 years for screening                            purposes. Stop Linzess; begin MiraLAX 17 g orally                            at bedtime. We will be happy to set up biofeedback,  as previously recommended, for pelvic floor                            dyssynergy if you desire; will see the patient back                            in 4 weeks to further assess recent abdominal pain.                           - Return to GI clinic in 4 weeks. Procedure Code(s):        --- Professional ---                           [phone], Colonoscopy, flexible; diagnostic, including                            collection of specimen(s) by brushing or washing,                            when performed (separate procedure)                           99152, Moderate sedation services provided by the                            same physician or other qualified health care                            professional performing the diagnostic or                            therapeutic service that the sedation supports,                            requiring the presence of an independent trained                            observer to assist in the monitoring of the                            patient's level of consciousness and physiological                            status; initial 15 minutes of intraservice time,                             patient age 8 years or older                           [phone], Moderate sedation services; each additional                            15 minutes intraservice time Diagnosis Code(s):        --- Professional ---  Z83.71, Family history of colonic polyps CPT copyright 2016 [employer]. All rights reserved. The codes documented in this report are preliminary and upon coder review may  be revised to meet current compliance requirements. Cristopher Estimable. Tonnette Zwiebel, MD Norvel Richards, MD 04/10/2017 1:44:26 PM This report has been signed electronically. Number of Addenda: 0

## 2017-04-10 NOTE — H&P (Signed)
@  WUJW@   Primary Care Physician:  Redmond School, MD Primary Gastroenterologist:  Dr. Gala Romney  Pre-Procedure History & Physical: HPI:  Jenna Chavez is a 51 y.o. female here for high-risk screening colonoscopy. Chronic constipation. History of pelvic floor dyssynergy  Past Medical History:  Diagnosis Date  . Anemia    2008  . Constipation   . GERD (gastroesophageal reflux disease)   . Glucose intolerance (impaired glucose tolerance)   . PUD (peptic ulcer disease)    Dr. Gala Romney: egd 1999  . S/P endoscopy March 1999   Dr. Gala Romney: normal EGD, healed gastric ulcer  . S/P endoscopy Jan 1999   gastric ulcer, negative malignancy    Past Surgical History:  Procedure Laterality Date  . CHOLECYSTECTOMY    . COLONOSCOPY  06/21/2011   RMR: normal rectum, colon, TI, repeat Aug 2017  . ESOPHAGOGASTRODUODENOSCOPY  06/21/2011   Planned but not done at time of TCS, no need  . OOPHORECTOMY     and fallopian tube on left side  . PARTIAL HYSTERECTOMY    . right toe surgery      Prior to Admission medications   Medication Sig Start Date End Date Taking? Authorizing Provider  polyethylene glycol-electrolytes (TRILYTE) 420 g solution Take 4,000 mLs by mouth as directed. 03/06/17  Yes Daneil Dolin, MD    Allergies as of 03/06/2017  . (No Known Allergies)    Family History  Problem Relation Age of Onset  . Colon polyps Father        onset early age  . Colon cancer Neg Hx     Social History   Social History  . Marital status: Married    Spouse name: N/A  . Number of children: N/A  . Years of education: N/A   Occupational History  . Not on file.   Social History Main Topics  . Smoking status: Never Smoker  . Smokeless tobacco: Never Used  . Alcohol use No  . Drug use: No  . Sexual activity: Not on file   Other Topics Concern  . Not on file   Social History Narrative  . No narrative on file    Review of Systems: See HPI, otherwise negative ROS  Physical Exam: BP  139/83   Pulse (!) 104   Temp 98.4 F (36.9 C) (Oral)   Resp 17   Ht 5' 6.5" (1.689 m)   Wt 215 lb (97.5 kg)   SpO2 100%   BMI 34.18 kg/m  General:   Alert,  Well-developed, well-nourished, pleasant and cooperative in NAD Neck:  Supple; no masses or thyromegaly. No significant cervical adenopathy. Lungs:  Clear throughout to auscultation.   No wheezes, crackles, or rhonchi. No acute distress. Heart:  Regular rate and rhythm; no murmurs, clicks, rubs,  or gallops. Abdomen: Non-distended, normal bowel sounds.  Soft and nontender without appreciable mass or hepatosplenomegaly.  Pulses:  Normal pulses noted. Extremities:  Without clubbing or edema.  Impression:  Pleasant 51 year old lady with a family history of precancerous polyps. For high-risk screening examination.  The risks, benefits, limitations, alternatives and imponderables have been reviewed with the patient. Questions have been answered. All parties are agreeable.      Notice: This dictation was prepared with Dragon dictation along with smaller phrase technology. Any transcriptional errors that result from this process are unintentional and may not be corrected upon review.

## 2017-04-10 NOTE — Discharge Instructions (Addendum)
°  Colonoscopy Discharge Instructions  Read the instructions outlined below and refer to this sheet in the next few weeks. These discharge instructions provide you with general information on caring for yourself after you leave the hospital. Your doctor may also give you specific instructions. While your treatment has been planned according to the most current medical practices available, unavoidable complications occasionally occur. If you have any problems or questions after discharge, call Dr. Gala Romney at [phone]. ACTIVITY  You may resume your regular activity, but move at a slower pace for the next 24 hours.   Take frequent rest periods for the next 24 hours.   Walking will help get rid of the air and reduce the bloated feeling in your belly (abdomen).   No driving for 24 hours (because of the medicine (anesthesia) used during the test).    Do not sign any important legal documents or operate any machinery for 24 hours (because of the anesthesia used during the test).  NUTRITION  Drink plenty of fluids.   You may resume your normal diet as instructed by your doctor.   Begin with a light meal and progress to your normal diet. Heavy or fried foods are harder to digest and may make you feel sick to your stomach (nauseated).   Avoid alcoholic beverages for 24 hours or as instructed.  MEDICATIONS  You may resume your normal medications unless your doctor tells you otherwise.  WHAT YOU CAN EXPECT TODAY  Some feelings of bloating in the abdomen.   Passage of more gas than usual.   Spotting of blood in your stool or on the toilet paper.  IF YOU HAD POLYPS REMOVED DURING THE COLONOSCOPY:  No aspirin products for 7 days or as instructed.   No alcohol for 7 days or as instructed.   Eat a soft diet for the next 24 hours.  FINDING OUT THE RESULTS OF YOUR TEST Not all test results are available during your visit. If your test results are not back during the visit, make an appointment  with your caregiver to find out the results. Do not assume everything is normal if you have not heard from your caregiver or the medical facility. It is important for you to follow up on all of your test results.  SEEK IMMEDIATE MEDICAL ATTENTION IF:  You have more than a spotting of blood in your stool.   Your belly is swollen (abdominal distention).   You are nauseated or vomiting.   You have a temperature over 101.  You have abdominal pain or discomfort that is severe or gets worse throughout the day.     Repeat high-risk screening colonoscopy in 5 years  Office visit with [place] in 4-6 weeks to further evaluate any residual abdominal pain.  Use MiraLAX 17 g orally at bedtime as needed for constipation  If you're interested in pursuing biofeedback therapy as previously recommended, we can get that arranged for you.

## 2017-04-15 ENCOUNTER — Encounter (HOSPITAL_COMMUNITY): Payer: Self-pay | Admitting: Internal Medicine

## 2017-04-24 ENCOUNTER — Encounter: Payer: Self-pay | Admitting: Internal Medicine

## 2017-06-16 ENCOUNTER — Emergency Department (HOSPITAL_COMMUNITY): Payer: BLUE CROSS/BLUE SHIELD

## 2017-06-16 ENCOUNTER — Emergency Department (HOSPITAL_COMMUNITY)
Admission: EM | Admit: 2017-06-16 | Discharge: 2017-06-16 | Disposition: A | Discharge status: Home / Self Care | Payer: BLUE CROSS/BLUE SHIELD | Attending: Emergency Medicine | Admitting: Emergency Medicine

## 2017-06-16 ENCOUNTER — Encounter (HOSPITAL_COMMUNITY): Payer: Self-pay | Admitting: Emergency Medicine

## 2017-06-16 DIAGNOSIS — Z79899 Other long term (current) drug therapy: Secondary | ICD-10-CM | POA: Insufficient documentation

## 2017-06-16 DIAGNOSIS — R0602 Shortness of breath: Secondary | ICD-10-CM | POA: Diagnosis not present

## 2017-06-16 DIAGNOSIS — R002 Palpitations: Secondary | ICD-10-CM | POA: Insufficient documentation

## 2017-06-16 DIAGNOSIS — R1013 Epigastric pain: Secondary | ICD-10-CM | POA: Insufficient documentation

## 2017-06-16 DIAGNOSIS — R072 Precordial pain: Secondary | ICD-10-CM | POA: Diagnosis present

## 2017-06-16 DIAGNOSIS — M549 Dorsalgia, unspecified: Secondary | ICD-10-CM | POA: Diagnosis not present

## 2017-06-16 LAB — CBC
HCT: 41.9 % (ref 36.0–46.0)
Hemoglobin: 13.8 g/dL (ref 12.0–15.0)
MCH: 28.6 pg (ref 26.0–34.0)
MCHC: 32.9 g/dL (ref 30.0–36.0)
MCV: 86.7 fL (ref 78.0–100.0)
PLATELETS: 307 10*3/uL (ref 150–400)
RBC: 4.83 MIL/uL (ref 3.87–5.11)
RDW: 14.5 % (ref 11.5–15.5)
WBC: 8.8 10*3/uL (ref 4.0–10.5)

## 2017-06-16 LAB — BASIC METABOLIC PANEL
ANION GAP: 10 (ref 5–15)
BUN: 13 mg/dL (ref 6–20)
CALCIUM: 9.4 mg/dL (ref 8.9–10.3)
CO2: 25 mmol/L (ref 22–32)
Chloride: 103 mmol/L (ref 101–111)
Creatinine, Ser: 1.01 mg/dL — ABNORMAL HIGH (ref 0.44–1.00)
Glucose, Bld: 124 mg/dL — ABNORMAL HIGH (ref 65–99)
Potassium: 3.2 mmol/L — ABNORMAL LOW (ref 3.5–5.1)
Sodium: 138 mmol/L (ref 135–145)

## 2017-06-16 LAB — TROPONIN I

## 2017-06-16 NOTE — Discharge Instructions (Signed)
Today's workup for the chest pain without any acute findings. Keep your appointment with cardiology. Recommended baby aspirin a day. Return for any new or worse symptoms.

## 2017-06-16 NOTE — ED Triage Notes (Signed)
Patient complains of central chest pain. Patient states she checked her heart rate and it was at 160 yesterday. Today she states it has been at 122 when she gets up to walk then drops down once she sits down to rest. Denies n/v, diaphoresis.

## 2017-06-16 NOTE — ED Provider Notes (Signed)
[facility] DEPT Provider Note   CSN: [id number] Arrival date & time: 06/16/17  1016     History   Chief Complaint Chief Complaint  Patient presents with  . Chest Pain  . Shortness of Breath    HPI Jenna Chavez is a 51 y.o. female.  Patient with complaint of epigastric and substernal chest pain since Friday. Intermittently radiating to the back but not constantly to the back. Patient is a pain like this before does have follow-up pending with cardiology. Has not had a stress test or cardiac cath. Associated sometimes with some palpitations. Patient thought her heart rate was going as high as 1 6120s. No nausea no vomiting no diaphoresis no significant shortness of breath. Patient has had her gallbladder removed.      Past Medical History:  Diagnosis Date  . Anemia    2008  . Constipation   . GERD (gastroesophageal reflux disease)   . Glucose intolerance (impaired glucose tolerance)   . PUD (peptic ulcer disease)    Dr. Gala Romney: egd 1999  . S/P endoscopy March 1999   Dr. Gala Romney: normal EGD, healed gastric ulcer  . S/P endoscopy Jan 1999   gastric ulcer, negative malignancy    Patient Active Problem List   Diagnosis Date Noted  . Epigastric pain 03/04/2012  . Abdominal pain 01/26/2012  . Constipation 01/26/2012  . Dyspepsia 01/26/2012  . LLQ abdominal pain 06/13/2011    Past Surgical History:  Procedure Laterality Date  . CHOLECYSTECTOMY    . COLONOSCOPY  06/21/2011   RMR: normal rectum, colon, TI, repeat Aug 2017  . COLONOSCOPY N/A 04/10/2017   Procedure: COLONOSCOPY;  Surgeon: Daneil Dolin, MD;  Location: [facility];  Service: Endoscopy;  Laterality: N/A;  1:45pm  . ESOPHAGOGASTRODUODENOSCOPY  06/21/2011   Planned but not done at time of TCS, no need  . OOPHORECTOMY     and fallopian tube on left side  . PARTIAL HYSTERECTOMY    . right toe surgery      OB History    No data available       Home Medications    Prior to Admission medications    Medication Sig Start Date End Date Taking? Authorizing Provider  acetaminophen (TYLENOL) 500 MG tablet Take 500 mg by mouth every 6 (six) hours as needed.   Yes [provider]  polyethylene glycol-electrolytes (TRILYTE) 420 g solution Take 4,000 mLs by mouth as directed. Patient not taking: Reported on 06/16/2017 03/06/17   Rourk, Cristopher Estimable, MD    Family History Family History  Problem Relation Age of Onset  . Colon polyps Father        onset early age  . Colon cancer Neg Hx     Social History Social History  Substance Use Topics  . Smoking status: Never Smoker  . Smokeless tobacco: Never Used  . Alcohol use No     Allergies   Patient has no known allergies.   Review of Systems Review of Systems  Constitutional: Negative for fever.  HENT: Negative for congestion.   Eyes: Negative for visual disturbance.  Respiratory: Negative for shortness of breath.   Cardiovascular: Positive for chest pain and palpitations.  Gastrointestinal: Negative for abdominal pain, nausea and vomiting.  Genitourinary: Negative for dysuria.  Musculoskeletal: Positive for back pain.  Skin: Negative for rash.  Neurological: Negative for headaches.  Hematological: Does not bruise/bleed easily.  Psychiatric/Behavioral: Negative for confusion.     Physical Exam Updated Vital Signs BP 134/82 (  BP Location: Left Arm)   Pulse 76   Temp 98.6 F (37 C) (Oral)   Resp 18   Ht 1.676 m (5\' 6" )   Wt 97.5 kg (215 lb)   SpO2 100%   BMI 34.70 kg/m   Physical Exam  Constitutional: She is oriented to person, place, and time. She appears well-developed and well-nourished. No distress.  HENT:  Head: Normocephalic and atraumatic.  Mouth/Throat: Oropharynx is clear and moist.  Eyes: Pupils are equal, round, and reactive to light. EOM are normal.  Neck: Normal range of motion. Neck supple.  Cardiovascular: Normal rate, regular rhythm and normal heart sounds.   Pulmonary/Chest: Effort normal and  breath sounds normal. No respiratory distress.  Abdominal: Soft. Bowel sounds are normal. There is no tenderness.  Musculoskeletal: Normal range of motion. She exhibits no edema.  Neurological: She is alert and oriented to person, place, and time. No cranial nerve deficit. She exhibits normal muscle tone. Coordination normal.  Skin: Skin is warm. No rash noted.  Nursing note and vitals reviewed.    ED Treatments / Results  Labs (all labs ordered are listed, but only abnormal results are displayed) Labs Reviewed  BASIC METABOLIC PANEL - Abnormal; Notable for the following:       Result Value   Potassium 3.2 (*)    Glucose, Bld 124 (*)    Creatinine, Ser 1.01 (*)    All other components within normal limits  CBC  TROPONIN I    EKG  EKG Interpretation  Date/Time:  Monday June 16 2017 10:24:37 EDT Ventricular Rate:  98 PR Interval:    QRS Duration: 90 QT Interval:  363 QTC Calculation: 464 R Axis:   55 Text Interpretation:  Sinus rhythm Borderline T wave abnormalities No significant change since last tracing Confirmed by Fredia Sorrow [phone]) on 06/16/2017 12:26:46 PM       Radiology Dg Chest 2 View  Result Date: 06/16/2017 CLINICAL DATA:  Central chest pain. EXAM: CHEST  2 VIEW COMPARISON:  01/31/2017 CT FINDINGS: The heart size and mediastinal contours are within normal limits. Both lungs are clear. The visualized skeletal structures are unremarkable. IMPRESSION: No active cardiopulmonary disease. Electronically Signed   By: Rolm Baptise M.D.   On: 06/16/2017 11:10    Procedures Procedures (including critical care time)  Medications Ordered in ED Medications - No data to display   Initial Impression / Assessment and Plan / ED Course  I have reviewed the triage vital signs and the nursing notes.  Pertinent labs & imaging results that were available during my care of the patient were reviewed by me and considered in my medical decision making (see chart for  details).     Vital signs here are normal. The cardiac monitoring without arrhythmia or rapid heart rate. Troponin negative. Chest x-ray without any acute findings. Patient has follow-up with cardiology pending. Recommend a baby aspirin a day and returning for any new or worse symptoms. Patient currently asymptomatic.  Final Clinical Impressions(s) / ED Diagnoses   Final diagnoses:  Precordial pain  Palpitations    New Prescriptions New Prescriptions   No medications on file     Fredia Sorrow, MD 06/16/17 1311

## 2017-06-16 NOTE — ED Notes (Signed)
Patient transported to X-ray 

## 2017-06-27 ENCOUNTER — Ambulatory Visit (INDEPENDENT_AMBULATORY_CARE_PROVIDER_SITE_OTHER): Payer: BLUE CROSS/BLUE SHIELD | Admitting: Gastroenterology

## 2017-06-27 ENCOUNTER — Encounter: Payer: Self-pay | Admitting: Gastroenterology

## 2017-06-27 VITALS — BP 139/92 | HR 78 | Temp 98.1°F | Ht 66.5 in | Wt 215.8 lb

## 2017-06-27 DIAGNOSIS — K5904 Chronic idiopathic constipation: Secondary | ICD-10-CM

## 2017-06-27 DIAGNOSIS — R1032 Left lower quadrant pain: Secondary | ICD-10-CM | POA: Diagnosis not present

## 2017-06-27 DIAGNOSIS — R1013 Epigastric pain: Secondary | ICD-10-CM | POA: Diagnosis not present

## 2017-06-27 LAB — CBC WITH DIFFERENTIAL/PLATELET
BASOS PCT: 1 %
Basophils Absolute: 61 cells/uL (ref 0–200)
Eosinophils Absolute: 79 cells/uL (ref 15–500)
Eosinophils Relative: 1.3 %
HCT: 38.7 % (ref 35.0–45.0)
HEMOGLOBIN: 13.1 g/dL (ref 11.7–15.5)
Lymphs Abs: 2715 cells/uL (ref 850–3900)
MCH: 28.4 pg (ref 27.0–33.0)
MCHC: 33.9 g/dL (ref 32.0–36.0)
MCV: 83.9 fL (ref 80.0–100.0)
MONOS PCT: 6.7 %
MPV: 11.2 fL (ref 7.5–12.5)
NEUTROS ABS: 2837 {cells}/uL (ref 1500–7800)
Neutrophils Relative %: 46.5 %
PLATELETS: 307 10*3/uL (ref 140–400)
RBC: 4.61 10*6/uL (ref 3.80–5.10)
RDW: 14 % (ref 11.0–15.0)
TOTAL LYMPHOCYTE: 44.5 %
WBC: 6.1 10*3/uL (ref 3.8–10.8)
WBCMIX: 409 {cells}/uL (ref 200–950)

## 2017-06-27 LAB — COMPREHENSIVE METABOLIC PANEL
AG RATIO: 1.4 (calc) (ref 1.0–2.5)
ALBUMIN MSPROF: 3.9 g/dL (ref 3.6–5.1)
ALT: 15 U/L (ref 6–29)
AST: 12 U/L (ref 10–35)
Alkaline phosphatase (APISO): 64 U/L (ref 33–115)
BUN: 14 mg/dL (ref 7–25)
CHLORIDE: 105 mmol/L (ref 98–110)
CO2: 26 mmol/L (ref 20–32)
Calcium: 9.3 mg/dL (ref 8.6–10.2)
Creat: 1.04 mg/dL (ref 0.50–1.10)
GLOBULIN: 2.8 g/dL (ref 1.9–3.7)
GLUCOSE: 107 mg/dL — AB (ref 65–99)
POTASSIUM: 4 mmol/L (ref 3.5–5.3)
SODIUM: 140 mmol/L (ref 135–146)
TOTAL PROTEIN: 6.7 g/dL (ref 6.1–8.1)
Total Bilirubin: 0.5 mg/dL (ref 0.2–1.2)

## 2017-06-27 LAB — TSH: TSH: 0.59 m[IU]/L

## 2017-06-27 LAB — LIPASE: Lipase: 19 U/L (ref 7–60)

## 2017-06-27 MED ORDER — POLYETHYLENE GLYCOL 3350 17 G PO PACK: 17.0000 g | PACK | Freq: Every day | ORAL | 0 refills | Status: DC

## 2017-06-27 NOTE — Progress Notes (Signed)
cc'ed to pcp °

## 2017-06-27 NOTE — Assessment & Plan Note (Signed)
51 year old female with history of chronic abdominal pain, chronic constipation who complains of different pain which began earlier this spring. Extensive workup including CTA chest, CT abdomen pelvis, colonoscopy. Previously seen at [facility] 2014 for chronic abdominal pain and constipation, diagnosed with pelvic dyssynergy and there was concern for abdominal wall pain at that time. Patient believes this is a new pain. Suspect will be dealing with musculoskeletal component. For completeness, we will check further labs. Encouraged her to resume MiraLAX 17 g daily to avoid straining as this would exacerbate abdominal wall pain. Patient describes having to be multiple different positions to have a BM, she will discuss with her gynecologist at her next visit in November, needs to be evaluated for possible rectocele. Further recommendations to follow pending labs. Next colonoscopy due in 5 years.

## 2017-06-27 NOTE — Progress Notes (Signed)
Primary Care Physician: Redmond School, MD  Primary Gastroenterologist:  Garfield Cornea, MD   Chief Complaint  Patient presents with  . Abdominal Pain    HPI: Jenna Chavez is a 51 y.o. female hereFor follow-up. She underwent a colonoscopy for increased risk screening due to family history of colon polyps back in June 2018. She had a normal exam. Next colonoscopy planned for 5 years.  Patient has a history of pelvic floor dyssynergy, previously declined on biofeedback therapy because she was doing well on MiraLAX. History of chronic abdominal pain, chronic constipation, remote peptic ulcer disease.  Earlier this year she developed epigastric pain with radiation around into both flanks. Workup included CTA chest and CT abdomen and pelvis. Noted to have left inguinal hernia containing fat only.  Since TCS, BM every morning. Occasional hard stool and incomplete, enema. Never went back on her MiraLAX because she gave it away. She has used some Linz[employer] samples she had a home. Continues to have intermittent epigastric pain on a daily basis. Unrelated to meals. Radiates to both upper abdomen and around the flank. Nothing seems to make it better or worse. Unrelated to movement. She also has left lower quadrant pain intermittently. No fever. Occurs daily. Feels like a deep bruising/burning pain.   H/o glucose intolerant. Sugars have been good. Just started ASA 81mg  daily in ER. Waking up in morning off/on with chest pain. Sees cardiology Tuesday.   No heartburn, indigestion. No dysphagia. Complains of excessive weight gain earlier this spring, occurred over the course of 2-3 weeks.   Current Outpatient Prescriptions  Medication Sig Dispense Refill  . aspirin EC 81 MG tablet Take 81 mg by mouth daily.     No current facility-administered medications for this visit.     Allergies as of 06/27/2017  . (No Known Allergies)   Past Medical History:  Diagnosis Date  . Anemia    2008    . Constipation   . GERD (gastroesophageal reflux disease)   . Glucose intolerance (impaired glucose tolerance)   . PUD (peptic ulcer disease)    Dr. Gala Romney: egd 1999  . S/P endoscopy March 1999   Dr. Gala Romney: normal EGD, healed gastric ulcer  . S/P endoscopy Jan 1999   gastric ulcer, negative malignancy   Past Surgical History:  Procedure Laterality Date  . CHOLECYSTECTOMY    . COLONOSCOPY  06/21/2011   RMR: normal rectum, colon, TI, repeat Aug 2017  . COLONOSCOPY N/A 04/10/2017   Procedure: COLONOSCOPY;  Surgeon: Daneil Dolin, MD;  Location: [facility];  Service: Endoscopy;  Laterality: N/A;  1:45pm  . ESOPHAGOGASTRODUODENOSCOPY  06/21/2011   Planned but not done at time of TCS, no need  . OOPHORECTOMY     and fallopian tube on left side  . PARTIAL HYSTERECTOMY    . right toe surgery      ROS:  General: Negative for anorexia, weight loss, fever, chills, fatigue, weakness. ENT: Negative for hoarseness, difficulty swallowing , nasal congestion. CV: Negative for chest pain, angina, palpitations, dyspnea on exertion, peripheral edema.  Respiratory: Negative for dyspnea at rest, dyspnea on exertion, cough, sputum, wheezing.  GI: See history of present illness. GU:  Negative for dysuria, hematuria, urinary incontinence, urinary frequency, nocturnal urination.  Endo: Negative for unusual weight change.    Physical Examination:   BP (!) 139/92   Pulse 78   Temp 98.1 F (36.7 C) (Oral)   Ht 5' 6.5" (1.689 m)   [employer] [facility]  215 lb 12.8 oz (97.9 kg)   BMI 34.31 kg/m   General: Well-nourished, well-developed in no acute distress.  Eyes: No icterus. Mouth: Oropharyngeal mucosa moist and pink , no lesions erythema or exudate. Lungs: Clear to auscultation bilaterally.  Heart: Regular rate and rhythm, no murmurs rubs or gallops.  Abdomen: Bowel sounds are normal,, nondistended, no hepatosplenomegaly or masses, no abdominal bruits or hernia , no rebound or guarding.  Mild tenderness noted  located midepigastric in both upper quadrants, no tenderness over the rib cage, no CVA tenderness Extremities: No lower extremity edema. No clubbing or deformities. Neuro: Alert and oriented x 4   Skin: Warm and dry, no jaundice.   Psych: Alert and cooperative, normal mood and affect.  Labs:  Lab Results  Component Value Date   WBC 8.8 06/16/2017   HGB 13.8 06/16/2017   HCT 41.9 06/16/2017   MCV 86.7 06/16/2017   PLT 307 06/16/2017   Lab Results  Component Value Date   CREATININE 1.01 (H) 06/16/2017   BUN 13 06/16/2017   NA 138 06/16/2017   K 3.2 (L) 06/16/2017   CL 103 06/16/2017   CO2 25 06/16/2017     Imaging Studies:  CLINICAL DATA:  Epigastric pain extending to the right side of the back. Left-sided abdominal pain for greater than 1 month. Unexplained weight gain.  EXAM: CT ANGIOGRAPHY CHEST  CT ABDOMEN AND PELVIS WITH CONTRAST  TECHNIQUE: Multidetector CT imaging of the chest was performed using the standard protocol during bolus administration of intravenous contrast. Multiplanar CT image reconstructions and MIPs were obtained to evaluate the vascular anatomy. Multidetector CT imaging of the abdomen and pelvis was performed using the standard protocol during bolus administration of intravenous contrast.  CONTRAST:  100 mL Isovue 370  COMPARISON:  One-view chest x-ray 02/01/2010  FINDINGS: CTA CHEST FINDINGS  Cardiovascular: Heart size is normal. There is a common origin of the left common carotid artery and innominate artery. Left vertebral artery originates from the aortic arch. No calcifications present. There is no aneurysm or dissection.  Pulmonary arterial opacification is satisfactory. No significant filling defects are present to suggest pulmonary emboli.  Mediastinum/Nodes: No significant mediastinal or axillary adenopathy is present. Esophagus is unremarkable.  Lungs/Pleura: The lungs are clear. No focal nodule, mass,  or airspace disease is present. No significant pleural or pericardial effusion.  Musculoskeletal: Vertebral body heights alignment are maintained. No focal lytic or blastic lesions are present. The ribs are unremarkable.  Review of the MIP images confirms the above findings.  CT ABDOMEN and PELVIS FINDINGS  Hepatobiliary: No focal liver abnormality is seen. Status post cholecystectomy. No biliary dilatation.  Pancreas: Unremarkable. No pancreatic ductal dilatation or surrounding inflammatory changes.  Spleen: Normal in size without focal abnormality.  Adrenals/Urinary Tract: The adrenal glands are normal. A central sinus cyst is present in the left kidney. The kidneys are otherwise within normal limits. The ureters the urinary bladder are unremarkable peer  Stomach/Bowel: Stomach and duodenum are within normal limits. The small bowel is unremarkable. The appendix is visualized and normal. The ascending transverse colon are within normal limits. The descending and sigmoid colon are unremarkable.  Vascular/Lymphatic: No significant vascular findings are present. No enlarged abdominal or pelvic lymph nodes.  Reproductive: Status post hysterectomy. No adnexal masses.  Other: Fat herniates into the left inguinal canal without associated bowel. There is no free air or free fluid.  Musculoskeletal: Tear body heights alignment are maintained. No focal lytic or blastic lesions are present. The  chronic pelvis is within normal limits. Mild degenerative changes are noted in the lower lumbar sites.  Review of the MIP images confirms the above findings.  IMPRESSION: 1. No acute or focal pathology to explain the patient's epigastric pain radiating to the back. 2. No evidence for aortic dissection. 3. No pulmonary embolus. 4. Negative CTA chest. 5. No acute or focal pathology in the abdomen. 6. Mild degenerative change in the lower lumbar spine.   Electronically  Signed   By: San Morelle M.D.   On: 01/31/2017 14:02

## 2017-06-27 NOTE — Patient Instructions (Signed)
1. Please have your labs done. We will contact you in 7-10 business days with results.  2. Start back on miralax once daily. Samples provided. Please avoid straining to have bowel movement.  3. Have your gynecologist to evaluate you for a RECTOCELE at your next appointment.

## 2017-06-30 ENCOUNTER — Encounter: Payer: Self-pay | Admitting: Cardiology

## 2017-06-30 NOTE — Progress Notes (Signed)
Cardiology Office Note  Date: 07/01/2017   ID: Jenna Chavez, DOB [birth year], MRN [id number]  PCP: Redmond School, MD  Consulting Cardiologist: Jenna Lesches, MD   Chief Complaint  Patient presents with  . Palpitations    History of Present Illness: Jenna Chavez is a 51 y.o. female referred for cardiology consultation by Dr. Gerarda Chavez for the evaluation of palpitations. She states that over the last few months she has had intermittent episodes of palpitations described sometimes as a flip and other times as a more prolonged tachycardia. She reports having a heart rate up in the 170s by her heart rate monitor one day at which point she was feeling fatigued. She also has intermittent chest discomfort with these symptoms, not clearly reproducible with any particular level of activity. She has not had any syncope.  She was seen in the ER recently in late August complaining of epigastric and lower chest discomfort with some associated palpitations. Troponin I level was negative and ECG showed no acute ST segment changes. Chest x-ray showed no acute findings.  Record review finds previous cardiology evaluation by Dr. Hamilton Capri with the [facility] practice, last seen in 2015. She reportedly had an echocardiogram done at that time, I am not able to pull the results.  Past Medical History:  Diagnosis Date  . Anemia    2008  . Constipation   . Gastric ulcer 1999   EGD per Dr. Gala Romney, negative for malignancy  . GERD (gastroesophageal reflux disease)   . Glucose intolerance (impaired glucose tolerance)     Past Surgical History:  Procedure Laterality Date  . CHOLECYSTECTOMY    . COLONOSCOPY  06/21/2011   RMR: normal rectum, colon, TI, repeat Aug 2017  . COLONOSCOPY N/A 04/10/2017   Procedure: COLONOSCOPY;  Surgeon: Jenna Dolin, MD;  Location: [facility];  Service: Endoscopy;  Laterality: N/A;  1:45pm  . ESOPHAGOGASTRODUODENOSCOPY  06/21/2011   Planned but not done at time of TCS,  no need  . OOPHORECTOMY     and fallopian tube on left side  . PARTIAL HYSTERECTOMY    . Right toe surgery    . WISDOM TOOTH EXTRACTION      Current Outpatient Prescriptions  Medication Sig Dispense Refill  . aspirin EC 81 MG tablet Take 81 mg by mouth daily.    . polyethylene glycol (MIRALAX) packet Take 17 g by mouth daily. (Patient not taking: Reported on 07/01/2017) 7 each 0   No current facility-administered medications for this visit.    Allergies:  Patient has no known allergies.   Social History: The patient  reports that she has never smoked. She has never used smokeless tobacco. She reports that she does not drink alcohol or use drugs.   Family History: The patient's family history includes Colon polyps in her father.   ROS:  Please see the history of present illness. Otherwise, complete review of systems is positive for weight gain of 20 pounds.  All other systems are reviewed and negative.   Physical Exam: VS:  BP (!) 146/84 (BP Location: Left Arm)   Pulse 77   Ht 5\' 6"  (1.676 m)   Wt 219 lb (99.3 kg)   SpO2 98%   BMI 35.35 kg/m , BMI Body mass index is 35.35 kg/m.  Wt Readings from Last 3 Encounters:  07/01/17 219 lb (99.3 kg)  06/27/17 215 lb 12.8 oz (97.9 kg)  06/16/17 215 lb (97.5 kg)    General: Obese woman, appears comfortable  at rest. HEENT: Conjunctiva and lids normal, oropharynx clear. Neck: Supple, no elevated JVP or carotid bruits, no thyromegaly. Lungs: Clear to auscultation, nonlabored breathing at rest. Cardiac: Regular rate and rhythm, no S3, soft basal systolic murmur, no pericardial rub. Abdomen: Soft, nontender, bowel sounds present, no guarding or rebound. Extremities: No pitting edema, distal pulses 2+. Skin: Warm and dry. Musculoskeletal: No kyphosis. Neuropsychiatric: Alert and oriented x3, affect grossly appropriate.  ECG: I personally reviewed the tracing from 06/16/2017 which showed sinus rhythm with nonspecific T-wave  changes.  Recent Labwork: 06/27/2017: ALT 15; AST 12; BUN 14; Creat 1.04; Hemoglobin 13.1; Platelets 307; Potassium 4.0; Sodium 140; TSH 0.59  November 2017: Cholesterol 257, triglycerides 162, HDL 93, LDL 132  Other Studies Reviewed Today:  Chest x-ray 06/16/2017: FINDINGS: The heart size and mediastinal contours are within normal limits. Both lungs are clear. The visualized skeletal structures are unremarkable.  IMPRESSION: No active cardiopulmonary disease.  Chest and abdominal CT 01/31/2017: FINDINGS: CTA CHEST FINDINGS  Cardiovascular: Heart size is normal. There is a common origin of the left common carotid artery and innominate artery. Left vertebral artery originates from the aortic arch. No calcifications present. There is no aneurysm or dissection.  Pulmonary arterial opacification is satisfactory. No significant filling defects are present to suggest pulmonary emboli.  Mediastinum/Nodes: No significant mediastinal or axillary adenopathy is present. Esophagus is unremarkable.  Lungs/Pleura: The lungs are clear. No focal nodule, mass, or airspace disease is present. No significant pleural or pericardial effusion.  Musculoskeletal: Vertebral body heights alignment are maintained. No focal lytic or blastic lesions are present. The ribs are unremarkable.  Review of the MIP images confirms the above findings.  CT ABDOMEN and PELVIS FINDINGS  Hepatobiliary: No focal liver abnormality is seen. Status post cholecystectomy. No biliary dilatation.  Pancreas: Unremarkable. No pancreatic ductal dilatation or surrounding inflammatory changes.  Spleen: Normal in size without focal abnormality.  Adrenals/Urinary Tract: The adrenal glands are normal. A central sinus cyst is present in the left kidney. The kidneys are otherwise within normal limits. The ureters the urinary bladder are unremarkable peer  Stomach/Bowel: Stomach and duodenum are within normal  limits. The small bowel is unremarkable. The appendix is visualized and normal. The ascending transverse colon are within normal limits. The descending and sigmoid colon are unremarkable.  Vascular/Lymphatic: No significant vascular findings are present. No enlarged abdominal or pelvic lymph nodes.  Reproductive: Status post hysterectomy. No adnexal masses.  Other: Fat herniates into the left inguinal canal without associated bowel. There is no free air or free fluid.  Musculoskeletal: Tear body heights alignment are maintained. No focal lytic or blastic lesions are present. The chronic pelvis is within normal limits. Mild degenerative changes are noted in the lower lumbar sites.  Review of the MIP images confirms the above findings.  IMPRESSION: 1. No acute or focal pathology to explain the patient's epigastric pain radiating to the back. 2. No evidence for aortic dissection. 3. No pulmonary embolus. 4. Negative CTA chest. 5. No acute or focal pathology in the abdomen. 6. Mild degenerative change in the lower lumbar spine.  Assessment and Plan:  1. Intermittent palpitations and chest discomfort, no associated syncope. Does not report any clear precipitant. Baseline ECG is nonspecific, recent troponin I level was normal during ER visit. Plan is to obtain a 48-hour Holter monitor, she reports symptoms as frequently as twice a day. Also obtain echocardiogram to assess cardiac structure and function.  2. History of anemia and previously documented gastric ulcer.  She follows with GI. Does not endorse any obvious bleeding problems. His hemoglobin 13.1.  3. Impaired glucose tolerance, follows with Dr. Gerarda Chavez.  Current medicines were reviewed with the patient today.   Orders Placed This Encounter  Procedures  . Holter monitor - 48 hour  . ECHOCARDIOGRAM COMPLETE    Disposition: Call with test results.  Signed, Satira Sark, MD, [facility] 07/01/2017 10:55 AM    [facility] [place]. [address], [place], [place] [address] Phone: [phone]; Fax: [phone]

## 2017-07-01 ENCOUNTER — Ambulatory Visit (HOSPITAL_COMMUNITY)
Admission: RE | Admit: 2017-07-01 | Discharge: 2017-07-01 | Disposition: A | Discharge status: Home / Self Care | Payer: BLUE CROSS/BLUE SHIELD | Source: Ambulatory Visit | Attending: Cardiology | Admitting: Cardiology

## 2017-07-01 ENCOUNTER — Ambulatory Visit (INDEPENDENT_AMBULATORY_CARE_PROVIDER_SITE_OTHER): Payer: BLUE CROSS/BLUE SHIELD | Admitting: Cardiology

## 2017-07-01 ENCOUNTER — Encounter: Payer: Self-pay | Admitting: Cardiology

## 2017-07-01 VITALS — BP 146/84 | HR 77 | Ht 66.0 in | Wt 219.0 lb

## 2017-07-01 DIAGNOSIS — R002 Palpitations: Secondary | ICD-10-CM

## 2017-07-01 DIAGNOSIS — Z8711 Personal history of peptic ulcer disease: Secondary | ICD-10-CM

## 2017-07-01 DIAGNOSIS — I491 Atrial premature depolarization: Secondary | ICD-10-CM | POA: Insufficient documentation

## 2017-07-01 DIAGNOSIS — R7302 Impaired glucose tolerance (oral): Secondary | ICD-10-CM

## 2017-07-01 DIAGNOSIS — R072 Precordial pain: Secondary | ICD-10-CM

## 2017-07-01 DIAGNOSIS — Z8719 Personal history of other diseases of the digestive system: Secondary | ICD-10-CM | POA: Diagnosis not present

## 2017-07-01 DIAGNOSIS — I493 Ventricular premature depolarization: Secondary | ICD-10-CM | POA: Insufficient documentation

## 2017-07-01 DIAGNOSIS — I517 Cardiomegaly: Secondary | ICD-10-CM | POA: Insufficient documentation

## 2017-07-01 DIAGNOSIS — Z862 Personal history of diseases of the blood and blood-forming organs and certain disorders involving the immune mechanism: Secondary | ICD-10-CM | POA: Diagnosis not present

## 2017-07-01 LAB — ECHOCARDIOGRAM COMPLETE
Height: 66 in
Weight: 3504 oz

## 2017-07-01 NOTE — Patient Instructions (Signed)
Your physician recommends that you schedule a follow-up appointment in: we will call you with results.   Your physician has requested that you have an echocardiogram. Echocardiography is a painless test that uses sound waves to create images of your heart. It provides your doctor with information about the size and shape of your heart and how well your heart's chambers and valves are working. This procedure takes approximately one hour. There are no restrictions for this procedure.    Your physician has recommended that you wear a holter monitor. Holter monitors are medical devices that record the heart's electrical activity. Doctors most often use these monitors to diagnose arrhythmias. Arrhythmias are problems with the speed or rhythm of the heartbeat. The monitor is a small, portable device. You can wear one while you do your normal daily activities. This is usually used to diagnose what is causing palpitations/syncope (passing out).      Your physician recommends that you continue on your current medications as directed. Please refer to the Current Medication list given to you today.   No lab work ordered today.     Thank you for choosing [facility] !

## 2017-07-01 NOTE — Progress Notes (Signed)
*  PRELIMINARY RESULTS* Echocardiogram 2D Echocardiogram has been performed.  Jenna Chavez 07/01/2017, 1:52 PM

## 2017-07-02 NOTE — Progress Notes (Signed)
Please let patient know her pancreas and liver numbers were normal. Her sugar is slightly high. Wbc normal. hgb normal. Thyroid blood work normal.   I would like for her to try the miralax and avoid all straining to have a bowel movement.  I would consider adding PPI like omeprazole or pantoprazole in case she has gastritis, etc to see if this would help her upper abd pain.  Bring her back in 2 months. If still having problems, then consider EGD. If symptoms worsen, she should let me know.

## 2017-07-03 ENCOUNTER — Encounter: Payer: Self-pay | Admitting: Internal Medicine

## 2017-07-03 NOTE — Progress Notes (Signed)
PATIENT SCHEDULED  °

## 2017-07-04 ENCOUNTER — Telehealth: Payer: Self-pay | Admitting: Cardiology

## 2017-07-04 NOTE — Telephone Encounter (Signed)
LVM Returning Cathey's call --can be reached @ [phone]

## 2017-07-07 ENCOUNTER — Ambulatory Visit: Payer: BLUE CROSS/BLUE SHIELD | Admitting: Cardiology

## 2017-07-07 NOTE — Telephone Encounter (Signed)
Spoke with pt, gave holter results

## 2017-09-02 ENCOUNTER — Ambulatory Visit: Payer: BLUE CROSS/BLUE SHIELD | Admitting: Gastroenterology

## 2017-09-08 ENCOUNTER — Ambulatory Visit: Payer: BLUE CROSS/BLUE SHIELD | Admitting: Gastroenterology

## 2017-09-08 ENCOUNTER — Encounter: Payer: Self-pay | Admitting: Gastroenterology

## 2017-09-08 VITALS — BP 143/87 | HR 72 | Temp 98.2°F | Ht 66.25 in | Wt 216.2 lb

## 2017-09-08 DIAGNOSIS — R1013 Epigastric pain: Secondary | ICD-10-CM

## 2017-09-08 DIAGNOSIS — K5900 Constipation, unspecified: Secondary | ICD-10-CM | POA: Diagnosis not present

## 2017-09-08 NOTE — Assessment & Plan Note (Signed)
Resolved.  Avoid straining for constipation.  Discontinue omeprazole as needed.  Return to the office in 1 year or sooner if needed.

## 2017-09-08 NOTE — Progress Notes (Signed)
Primary Care Physician: Redmond School, MD  Primary Gastroenterologist:  Garfield Cornea, MD   Chief Complaint  Patient presents with  . Abdominal Pain    f/u, doing ok    HPI: Jenna Chavez is a 51 y.o. female here for follow-up.  She is seen back in September for abdominal pain.  Last colonoscopy June 2018 for increased risk screening due to family history of colon polyps.  She had a normal exam.  Next colonoscopy planned for 5 years.  When I last saw her she was having some epigastric pain on a daily basis.  Unrelated to meals.  Radiating into both upper quadrants around the flank areas.  Also with left lower quadrant pain intermittently.  Described as of bruising/burning type pain.  We checked labs including TSH, LFTs, kidney function, white blood cell count.  Basically unrevealing.  Due to previous history of abdominal wall pain I advised her to go ahead and take her MiraLAX regularly to combat her constipation and to avoid abdominal straining.  She states she is doing great at this time.  Her abdominal pain has resolved.  Her bowel movements are much improved.  States she is having spontaneous bowel movements without straining.  BM 4-5 times per week.  No heartburn.  No vomiting.  No blood in the stool or melena.  She is using MiraLAX on a regular basis now. On phentermine trying to lose weight. Dr. Gertie Fey, [place], states she had a small rectocele but no need for surgery at this time.    Pertinent GI history Previously seen at [facility] 2014 for chronic abdominal pain and constipation, diagnosed with pelvic dyssynergy and there was concern for abdominal wall pain at that time. Patient declined on biofeedback therapy because she was doing well on MiraLAX. History of chronic abdominal pain, chronic constipation, remote peptic ulcer disease.  Earlier this year she developed epigastric pain with radiation around into both flanks. Workup included CTA  chest and CT abdomen and pelvis. Noted to have left inguinal hernia containing fat only.  Current Outpatient Medications  Medication Sig Dispense Refill  . aspirin EC 81 MG tablet Take 81 mg by mouth daily.    Marland Kitchen BYSTOLIC 5 MG tablet Take 1 tablet daily by mouth.    Marland Kitchen omeprazole (PRILOSEC) 20 MG capsule Take 20 mg daily by mouth.    . phentermine 37.5 MG capsule Take 37.5 mg daily by mouth.    . polyethylene glycol (MIRALAX) packet Take 17 g by mouth daily. (Patient taking differently: Take 17 g daily as needed by mouth. ) 7 each 0   No current facility-administered medications for this visit.     Allergies as of 09/08/2017  . (No Known Allergies)    ROS:  General: Negative for anorexia, weight loss, fever, chills, fatigue, weakness. ENT: Negative for hoarseness, difficulty swallowing , nasal congestion. CV: Negative for chest pain, angina, palpitations, dyspnea on exertion, peripheral edema.  Respiratory: Negative for dyspnea at rest, dyspnea on exertion, cough, sputum, wheezing.  GI: See history of present illness. GU:  Negative for dysuria, hematuria, urinary incontinence, urinary frequency, nocturnal urination.  Endo: Negative for unusual weight change.    Physical Examination:   BP (!) 143/87   Pulse 72   Temp 98.2 F (36.8 C) (Oral)   Ht 5' 6.25" (1.683 m)   Wt 216 lb 3.2 oz (98.1 kg)   BMI 34.63 kg/m   General: Well-nourished, well-developed in no acute  distress.  Eyes: No icterus. Mouth: Oropharyngeal mucosa moist and pink , no lesions erythema or exudate. Lungs: Clear to auscultation bilaterally.  Heart: Regular rate and rhythm, no murmurs rubs or gallops.  Abdomen: Bowel sounds are normal, nontender, nondistended, no hepatosplenomegaly or masses, no abdominal bruits or hernia , no rebound or guarding.   Extremities: No lower extremity edema. No clubbing or deformities. Neuro: Alert and oriented x 4   Skin: Warm and dry, no jaundice.   Psych: Alert and  cooperative, normal mood and affect.

## 2017-09-08 NOTE — Assessment & Plan Note (Signed)
Doing very well.  Continue MiraLAX regularly.  Return to the office in 1 year or sooner if needed.

## 2017-09-08 NOTE — Patient Instructions (Signed)
1. Return to the office in one year or sooner if needed.  2. Have a great Thanksgiving!

## 2017-09-08 NOTE — Progress Notes (Signed)
cc'ed to pcp °

## 2017-09-16 ENCOUNTER — Other Ambulatory Visit (HOSPITAL_COMMUNITY): Payer: Self-pay | Admitting: Obstetrics and Gynecology

## 2017-09-16 DIAGNOSIS — Z1231 Encounter for screening mammogram for malignant neoplasm of breast: Secondary | ICD-10-CM

## 2017-10-16 ENCOUNTER — Ambulatory Visit (HOSPITAL_COMMUNITY): Payer: BLUE CROSS/BLUE SHIELD

## 2017-10-17 ENCOUNTER — Ambulatory Visit (HOSPITAL_COMMUNITY): Payer: BLUE CROSS/BLUE SHIELD

## 2017-10-21 DIAGNOSIS — R002 Palpitations: Secondary | ICD-10-CM

## 2017-10-21 HISTORY — DX: Palpitations: R00.2

## 2017-10-23 ENCOUNTER — Ambulatory Visit (HOSPITAL_COMMUNITY)
Admission: RE | Admit: 2017-10-23 | Discharge: 2017-10-23 | Disposition: A | Discharge status: Home / Self Care | Payer: BLUE CROSS/BLUE SHIELD | Source: Ambulatory Visit | Attending: Obstetrics and Gynecology | Admitting: Obstetrics and Gynecology

## 2017-10-23 DIAGNOSIS — Z1231 Encounter for screening mammogram for malignant neoplasm of breast: Secondary | ICD-10-CM | POA: Insufficient documentation

## 2018-06-23 ENCOUNTER — Ambulatory Visit (HOSPITAL_COMMUNITY)
Admission: RE | Admit: 2018-06-23 | Discharge: 2018-06-23 | Disposition: A | Discharge status: Home / Self Care | Payer: BLUE CROSS/BLUE SHIELD | Source: Ambulatory Visit | Attending: Internal Medicine | Admitting: Internal Medicine

## 2018-06-23 ENCOUNTER — Other Ambulatory Visit (HOSPITAL_COMMUNITY): Payer: Self-pay | Admitting: Internal Medicine

## 2018-06-23 DIAGNOSIS — R071 Chest pain on breathing: Secondary | ICD-10-CM | POA: Diagnosis present

## 2018-07-03 ENCOUNTER — Other Ambulatory Visit (HOSPITAL_COMMUNITY): Payer: Self-pay | Admitting: Internal Medicine

## 2018-07-03 DIAGNOSIS — R1310 Dysphagia, unspecified: Secondary | ICD-10-CM

## 2018-07-10 ENCOUNTER — Ambulatory Visit (HOSPITAL_COMMUNITY)
Admission: RE | Admit: 2018-07-10 | Discharge: 2018-07-10 | Disposition: A | Discharge status: Home / Self Care | Payer: BLUE CROSS/BLUE SHIELD | Source: Ambulatory Visit | Attending: Internal Medicine | Admitting: Internal Medicine

## 2018-07-10 DIAGNOSIS — R1310 Dysphagia, unspecified: Secondary | ICD-10-CM | POA: Diagnosis not present

## 2018-08-10 ENCOUNTER — Other Ambulatory Visit: Payer: Self-pay

## 2018-08-10 ENCOUNTER — Emergency Department (HOSPITAL_COMMUNITY): Payer: BLUE CROSS/BLUE SHIELD

## 2018-08-10 ENCOUNTER — Encounter (HOSPITAL_COMMUNITY): Payer: Self-pay

## 2018-08-10 ENCOUNTER — Emergency Department (HOSPITAL_COMMUNITY)
Admission: EM | Admit: 2018-08-10 | Discharge: 2018-08-10 | Disposition: A | Discharge status: Home / Self Care | Payer: BLUE CROSS/BLUE SHIELD | Attending: Emergency Medicine | Admitting: Emergency Medicine

## 2018-08-10 DIAGNOSIS — R1010 Upper abdominal pain, unspecified: Secondary | ICD-10-CM

## 2018-08-10 DIAGNOSIS — R1013 Epigastric pain: Secondary | ICD-10-CM | POA: Diagnosis present

## 2018-08-10 DIAGNOSIS — Z7982 Long term (current) use of aspirin: Secondary | ICD-10-CM | POA: Diagnosis not present

## 2018-08-10 DIAGNOSIS — Z79899 Other long term (current) drug therapy: Secondary | ICD-10-CM | POA: Insufficient documentation

## 2018-08-10 DIAGNOSIS — N281 Cyst of kidney, acquired: Secondary | ICD-10-CM | POA: Diagnosis not present

## 2018-08-10 LAB — URINALYSIS, ROUTINE W REFLEX MICROSCOPIC
Bilirubin Urine: NEGATIVE
GLUCOSE, UA: NEGATIVE mg/dL
HGB URINE DIPSTICK: NEGATIVE
Ketones, ur: NEGATIVE mg/dL
LEUKOCYTES UA: NEGATIVE
Nitrite: NEGATIVE
Protein, ur: NEGATIVE mg/dL
SPECIFIC GRAVITY, URINE: 1.024 (ref 1.005–1.030)
pH: 5 (ref 5.0–8.0)

## 2018-08-10 LAB — COMPREHENSIVE METABOLIC PANEL
ALBUMIN: 3.8 g/dL (ref 3.5–5.0)
ALK PHOS: 51 U/L (ref 38–126)
ALT: 32 U/L (ref 0–44)
AST: 21 U/L (ref 15–41)
Anion gap: 7 (ref 5–15)
BUN: 17 mg/dL (ref 6–20)
CHLORIDE: 106 mmol/L (ref 98–111)
CO2: 26 mmol/L (ref 22–32)
Calcium: 9.1 mg/dL (ref 8.9–10.3)
Creatinine, Ser: 1.1 mg/dL — ABNORMAL HIGH (ref 0.44–1.00)
GFR calc non Af Amer: 58 mL/min — ABNORMAL LOW (ref >60)
GLUCOSE: 115 mg/dL — AB (ref 70–99)
POTASSIUM: 3.8 mmol/L (ref 3.5–5.1)
SODIUM: 139 mmol/L (ref 135–145)
Total Bilirubin: 0.5 mg/dL (ref 0.3–1.2)
Total Protein: 7 g/dL (ref 6.5–8.1)

## 2018-08-10 LAB — CBC
HEMATOCRIT: 42.3 % (ref 36.0–46.0)
HEMOGLOBIN: 13.2 g/dL (ref 12.0–15.0)
MCH: 28.5 pg (ref 26.0–34.0)
MCHC: 31.2 g/dL (ref 30.0–36.0)
MCV: 91.4 fL (ref 80.0–100.0)
NRBC: 0 % (ref 0.0–0.2)
Platelets: 315 10*3/uL (ref 150–400)
RBC: 4.63 MIL/uL (ref 3.87–5.11)
RDW: 14.4 % (ref 11.5–15.5)
WBC: 7.1 10*3/uL (ref 4.0–10.5)

## 2018-08-10 LAB — LIPASE, BLOOD: LIPASE: 47 U/L (ref 11–51)

## 2018-08-10 MED ORDER — GADOBUTROL 1 MMOL/ML IV SOLN
9.0000 mL | Freq: Once | INTRAVENOUS | Status: AC | PRN
  Administered 2018-08-10: 9 mL via INTRAVENOUS

## 2018-08-10 MED ORDER — FAMOTIDINE IN NACL 20-0.9 MG/50ML-% IV SOLN
20.0000 mg | Freq: Once | INTRAVENOUS | Status: AC
  Administered 2018-08-10: 20 mg via INTRAVENOUS
  Filled 2018-08-10: qty 50

## 2018-08-10 MED ORDER — ONDANSETRON HCL 4 MG/2ML IJ SOLN
4.0000 mg | Freq: Once | INTRAMUSCULAR | Status: AC
  Administered 2018-08-10: 4 mg via INTRAVENOUS
  Filled 2018-08-10: qty 2

## 2018-08-10 MED ORDER — TRAMADOL HCL 50 MG PO TABS: 50.0000 mg | ORAL_TABLET | Freq: Four times a day (QID) | ORAL | 0 refills | Status: DC | PRN

## 2018-08-10 MED ORDER — PANTOPRAZOLE SODIUM 20 MG PO TBEC: 20.0000 mg | DELAYED_RELEASE_TABLET | Freq: Every day | ORAL | 0 refills | Status: DC

## 2018-08-10 MED ORDER — SODIUM CHLORIDE 0.9 % IV SOLN
INTRAVENOUS | Status: DC | PRN
  Administered 2018-08-10: 250 mL via INTRAVENOUS

## 2018-08-10 NOTE — Discharge Instructions (Signed)
Follow-up with your doctor in the next week for recheck.  Also follow-up with [facility] in the next few weeks for evaluation of the cyst on your kidney

## 2018-08-10 NOTE — ED Triage Notes (Signed)
Pt reports r side abd and flank pain x 1 week.  Reports nausea, no vomiting.  Denies any pain or burning with urination but says she's noticed her urine has a strong odor and is dark.  LBM was today.  Denies v/d.  Denies any vaginal bleeding or discharge.  Pt also c/o waking with a headache daily since last Sunday.

## 2018-08-10 NOTE — ED Provider Notes (Signed)
[facility] EMERGENCY DEPARTMENT Provider Note   CSN: [id number] Arrival date & time: 08/10/18  0741     History   Chief Complaint Chief Complaint  Patient presents with  . Abdominal Pain    HPI Jenna Chavez is a 52 y.o. female.  Patient complains of abdominal pain.  Pain is in her right flank and also epigastric area  The history is provided by the patient. No language interpreter was used.  Abdominal Pain   This is a new problem. The current episode started 12 to 24 hours ago. The problem occurs constantly. The problem has not changed since onset.The pain is associated with an unknown factor. The pain is located in the generalized abdominal region. The quality of the pain is aching. The pain is at a severity of 5/10. The pain is moderate. Pertinent negatives include anorexia, diarrhea, frequency, hematuria and headaches.    Past Medical History:  Diagnosis Date  . Anemia    2008  . Constipation   . Gastric ulcer 1999   EGD per Dr. Gala Romney, negative for malignancy  . GERD (gastroesophageal reflux disease)   . Glucose intolerance (impaired glucose tolerance)     Patient Active Problem List   Diagnosis Date Noted  . Epigastric pain 03/04/2012  . Abdominal pain 01/26/2012  . Constipation 01/26/2012  . Dyspepsia 01/26/2012  . LLQ abdominal pain 06/13/2011    Past Surgical History:  Procedure Laterality Date  . CHOLECYSTECTOMY    . COLONOSCOPY  06/21/2011   RMR: normal rectum, colon, TI, repeat Aug 2017  . COLONOSCOPY N/A 04/10/2017   Procedure: COLONOSCOPY;  Surgeon: Daneil Dolin, MD;  [facility]: [facility];  Service: Endoscopy;  Laterality: N/A;  1:45pm  . ESOPHAGOGASTRODUODENOSCOPY  06/21/2011   Planned but not done at time of TCS, no need  . OOPHORECTOMY     and fallopian tube on left side  . PARTIAL HYSTERECTOMY    . Right toe surgery    . WISDOM TOOTH EXTRACTION       OB History   None      Home Medications    Prior to Admission medications    Medication Sig Start Date End Date Taking? Authorizing Provider  aspirin EC 81 MG tablet Take 81 mg by mouth daily.   Yes [provider]  Boric Acid POWD Place 1 capsule vaginally once a week.   Yes [provider]  BYSTOLIC 5 MG tablet Take 1 tablet daily by mouth. 09/05/17  Yes [provider]  phentermine 37.5 MG capsule Take 37.5 mg daily by mouth.   Yes [provider]  pravastatin (PRAVACHOL) 20 MG tablet Take 20 mg by mouth daily.   Yes [provider]  pantoprazole (PROTONIX) 20 MG tablet Take 1 tablet (20 mg total) by mouth daily. 08/10/18   Milton Ferguson, MD  polyethylene glycol [facility]) packet Take 17 g by mouth daily. Patient not taking: Reported on 08/10/2018 06/27/17   Mahala Menghini, PA-C  traMADol (ULTRAM) 50 MG tablet Take 1 tablet (50 mg total) by mouth every 6 (six) hours as needed. 08/10/18   Milton Ferguson, MD    Family History Family History  Problem Relation Age of Onset  . Colon polyps Father        onset early age  . Colon cancer Neg Hx     Social History Social History   Tobacco Use  . Smoking status: Never Smoker  . Smokeless tobacco: Never Used  Substance Use Topics  .  Alcohol use: No  . Drug use: No     Allergies   Patient has no known allergies.   Review of Systems Review of Systems  Constitutional: Negative for appetite change and fatigue.  HENT: Negative for congestion, ear discharge and sinus pressure.   Eyes: Negative for discharge.  Respiratory: Negative for cough.   Cardiovascular: Negative for chest pain.  Gastrointestinal: Positive for abdominal pain. Negative for anorexia and diarrhea.  Genitourinary: Negative for frequency and hematuria.  Musculoskeletal: Negative for back pain.  Skin: Negative for rash.  Neurological: Negative for seizures and headaches.  Psychiatric/Behavioral: Negative for hallucinations.     Physical Exam Updated Vital Signs BP 128/80   Pulse 68   Temp  98.3 F (36.8 C) (Oral)   Resp 18   Ht 5' 6.5" (1.689 m)   Wt 90.3 kg   SpO2 100%   BMI 31.64 kg/m   Physical Exam  Constitutional: She is oriented to person, place, and time. She appears well-developed.  HENT:  Head: Normocephalic.  Eyes: Conjunctivae and EOM are normal. No scleral icterus.  Neck: Neck supple. No thyromegaly present.  Cardiovascular: Normal rate and regular rhythm. Exam reveals no gallop and no friction rub.  No murmur heard. Pulmonary/Chest: No stridor. She has no wheezes. She has no rales. She exhibits no tenderness.  Abdominal: She exhibits no distension. There is tenderness. There is no rebound.  Musculoskeletal: Normal range of motion. She exhibits no edema.  Lymphadenopathy:    She has no cervical adenopathy.  Neurological: She is oriented to person, place, and time. She exhibits normal muscle tone. Coordination normal.  Skin: No rash noted. No erythema.  Psychiatric: She has a normal mood and affect. Her behavior is normal.     ED Treatments / Results  Labs (all labs ordered are listed, but only abnormal results are displayed) Labs Reviewed  COMPREHENSIVE METABOLIC PANEL - Abnormal; Notable for the following components:      Result Value   Glucose, Bld 115 (*)    Creatinine, Ser 1.10 (*)    GFR calc non Af Amer 58 (*)    All other components within normal limits  URINALYSIS, ROUTINE W REFLEX MICROSCOPIC - Abnormal; Notable for the following components:   APPearance HAZY (*)    All other components within normal limits  LIPASE, BLOOD  CBC    EKG None  Radiology Mr Abdomen W Or Wo Contrast  Result Date: 08/10/2018 CLINICAL DATA:  Evaluate left cystic renal lesion EXAM: MRI ABDOMEN WITHOUT AND WITH CONTRAST TECHNIQUE: Multiplanar multisequence MR imaging of the abdomen was performed both before and after the administration of intravenous contrast. CONTRAST:  9 cc Gadavist COMPARISON:  CT abdomen 08/10/2018 and prior CT abdomen 01/31/2017.  FINDINGS: Lower chest: The lung bases are clear of acute process. No pleural or pericardial effusion. Hepatobiliary: No focal hepatic lesions or intrahepatic biliary dilatation. The gallbladder is surgically absent. No common bile duct dilatation. Pancreas:  No mass, inflammation or ductal dilatation. Spleen:  Normal size.  No focal lesions. Adrenals/Urinary Tract:  The adrenal glands are normal. The right kidney is unremarkable.  No renal mass or hydronephrosis. The left kidney demonstrates a cystic lesion herniating out of the pelvis into the perirenal space. This has been present since the prior MRI lumbar spine since 2014 and is slightly larger when compared to prior CT scan from 2018. The described soft tissue thickening along the anterior aspect of the cyst appears to be veins draining the lower pole region  of the left kidney. I do not see any worrisome MR imaging features. 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 Stomach/Bowel: Visualized portions within the abdomen are unremarkable. Vascular/Lymphatic: No pathologically enlarged lymph nodes identified. No abdominal aortic aneurysm demonstrated. Other:  No ascites or abdominal wall hernia. Musculoskeletal: No significant bony findings. IMPRESSION: 1. 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 Slight interval enlargement since prior CT scan from April 2018. It was also present but smaller on a prior MRI from 2014. Recommend follow-up MR examination in 1 year to document stability 2. No acute abdominal findings or lymphadenopathy. 3. Status post cholecystectomy.  No biliary dilatation. Electronically Signed   By: Marijo Sanes M.D.   On: 08/10/2018 13:36   Ct Renal Stone Study  Result Date: 08/10/2018 CLINICAL DATA:  RIGHT-sided abdominal pain for 1 week EXAM: CT ABDOMEN AND PELVIS WITHOUT  CONTRAST TECHNIQUE: Multidetector CT imaging of the abdomen and pelvis was performed following the standard protocol without IV contrast. COMPARISON:  CT 01/31/2017 FINDINGS: Lower chest: Lung bases are clear. Hepatobiliary: No focal hepatic lesion. No biliary duct dilatation. Gallbladder is normal. Common bile duct is normal. Pancreas: Pancreas is normal. No ductal dilatation. No pancreatic inflammation. Spleen: Normal spleen Adrenals/urinary tract: Adrenal glands normal. Nonobstructing 2 mm calculus in the mid LEFT kidney. No ureterolithiasis or obstructive uropathy. In lower pole of the LEFT kidney, cystic lesion with mural nodularity is increased slightly in size compared to prior. Lesion measures 3.7 x 2.9 cm (image 33/2) compared to 2.9 x 2.5 cm. There is thickening of the anterior wall of the cyst is 7 mm (image 33/2). No bladder calculi. Multiple vascular calcifications in the or pelvis. Stomach/Bowel: Stomach, small-bowel cecum normal. The appendix is normal caliber and non inflamed. There are however multiple appendicoliths within the lumen of the appendix. The colon rectosigmoid colon normal. Vascular/Lymphatic: Abdominal aorta is normal caliber. No periportal or retroperitoneal adenopathy. No pelvic adenopathy. Reproductive: Post hysterectomy. Other: No free fluid. Musculoskeletal: No aggressive osseous lesion. IMPRESSION: 1. Multiple appendicoliths within the appendix lumen. No evidence acute appendicitis. 2. Nonobstructing LEFT renal calculus. 3. Indeterminate cystic lesion in the LEFT kidney with mural thickening. Recommend MRI renal protocol with without IV contrast to exclude cystic renal neoplasm. Electronically Signed   By: Suzy Bouchard M.D.   On: 08/10/2018 10:19    Procedures Procedures (including critical care time)  Medications Ordered in ED Medications  0.9 %  sodium chloride infusion ( Intravenous Stopped 08/10/18 1244)  famotidine (PEPCID) IVPB 20 mg premix (0 mg Intravenous  Stopped 08/10/18 1244)  ondansetron (ZOFRAN) injection 4 mg (4 mg Intravenous Given 08/10/18 1009)  gadobutrol (GADAVIST) 1 MMOL/ML injection 9 mL (9 mLs Intravenous Contrast Given 08/10/18 1244)     Initial Impression / Assessment and Plan / ED Course  I have reviewed the triage vital signs and the nursing notes.  Pertinent labs & imaging results that were available during my care of the patient were reviewed by me and considered in my medical decision making (see chart for details).     Labs unremarkable.  CT scan shows mass in left kidney.  MRI scan shows cyst in left kidney.  Patient is placed on Protonix and will follow up with her family doctor along with urology  Final Clinical Impressions(s) / ED Diagnoses   Final diagnoses:  Pain of upper abdomen    ED Discharge Orders         Ordered    traMADol (ULTRAM) 50 MG tablet  Every 6 hours PRN     08/10/18 1352    pantoprazole (PROTONIX) 20 MG tablet  Daily     08/10/18 1352           Milton Ferguson, MD 08/10/18 1357

## 2018-08-13 ENCOUNTER — Ambulatory Visit: Payer: BLUE CROSS/BLUE SHIELD | Admitting: Cardiology

## 2018-08-13 ENCOUNTER — Encounter: Payer: Self-pay | Admitting: Cardiology

## 2018-08-13 VITALS — BP 158/86 | HR 100 | Ht 66.5 in | Wt 216.0 lb

## 2018-08-13 DIAGNOSIS — Z8249 Family history of ischemic heart disease and other diseases of the circulatory system: Secondary | ICD-10-CM | POA: Diagnosis not present

## 2018-08-13 DIAGNOSIS — R7302 Impaired glucose tolerance (oral): Secondary | ICD-10-CM

## 2018-08-13 DIAGNOSIS — E782 Mixed hyperlipidemia: Secondary | ICD-10-CM

## 2018-08-13 DIAGNOSIS — R072 Precordial pain: Secondary | ICD-10-CM

## 2018-08-13 NOTE — Progress Notes (Signed)
Cardiology Office Note  Date: 08/13/2018   ID: Jenna Chavez, DOB 1966/07/15, MRN [id number]  PCP: Redmond School, MD  Primary Cardiologist: Rozann Lesches, MD   Chief Complaint  Patient presents with  . Chest Pain    History of Present Illness: Jenna Chavez is a 52 y.o. female last seen in September 2018.  She is referred back to the office by Dr. Gerarda Fraction for the evaluation of chest pain.  She tells me that she has had different sensations in her chest, more recently felt like a "pill was stuck" and a "lump" in her throat.  She had a barium swallow and ultimately her symptoms resolved without specific intervention or defined pathology.  At other times she has had more of a "twisting" discomfort in her chest radiating up into the neck.  This is not purely exertional.  She has been concerned reporting a history of premature CAD in her mother who died in her 29s and also heart disease in her father.  She tells me that she has a sister with a defibrillator after an episode of myocarditis.  Personal cardiac risk factors include impaired glucose tolerance and also hyperlipidemia.  He has not undergone formal ischemic testing.  I personally reviewed her recent ECG from September 2019 which showed normal sinus rhythm.  Follow-up ECG today is normal as well.  Past Medical History:  Diagnosis Date  . Anemia    2008  . Constipation   . Gastric ulcer 1999   EGD per Dr. Gala Romney, negative for malignancy  . GERD (gastroesophageal reflux disease)   . Glucose intolerance (impaired glucose tolerance)     Past Surgical History:  Procedure Laterality Date  . CHOLECYSTECTOMY    . COLONOSCOPY  06/21/2011   RMR: normal rectum, colon, TI, repeat Aug 2017  . COLONOSCOPY N/A 04/10/2017   Procedure: COLONOSCOPY;  Surgeon: Daneil Dolin, MD;  Location: [facility];  Service: Endoscopy;  Laterality: N/A;  1:45pm  . ESOPHAGOGASTRODUODENOSCOPY  06/21/2011   Planned but not done at time of TCS, no  need  . OOPHORECTOMY     and fallopian tube on left side  . PARTIAL HYSTERECTOMY    . Right toe surgery    . WISDOM TOOTH EXTRACTION      Current Outpatient Medications  Medication Sig Dispense Refill  . aspirin EC 81 MG tablet Take 81 mg by mouth daily.    . Boric Acid POWD Place 1 capsule vaginally once a week.    Marland Kitchen BYSTOLIC 5 MG tablet Take 1 tablet daily by mouth.    . pantoprazole (PROTONIX) 20 MG tablet Take 1 tablet (20 mg total) by mouth daily. 30 tablet 0  . phentermine 37.5 MG capsule Take 37.5 mg daily by mouth.    . pravastatin (PRAVACHOL) 20 MG tablet Take 20 mg by mouth daily.    . traMADol (ULTRAM) 50 MG tablet Take 1 tablet (50 mg total) by mouth every 6 (six) hours as needed. 20 tablet 0   No current facility-administered medications for this visit.    Allergies:  Patient has no known allergies.   Social History: The patient  reports that she has never smoked. She has never used smokeless tobacco. She reports that she does not drink alcohol or use drugs.   Family History: The patient's family history includes Colon polyps in her father.   ROS:  Please see the history of present illness. Otherwise, complete review of systems is positive for none.  All other systems are reviewed and negative.   Physical Exam: VS:  BP (!) 158/86   Pulse 100   Ht 5' 6.5" (1.689 m)   Wt 216 lb (98 kg)   SpO2 98%   BMI 34.34 kg/m , BMI Body mass index is 34.34 kg/m.  Wt Readings from Last 3 Encounters:  08/13/18 216 lb (98 kg)  08/10/18 199 lb (90.3 kg)  09/08/17 216 lb 3.2 oz (98.1 kg)    General: Patient appears comfortable at rest. HEENT: Conjunctiva and lids normal, oropharynx clear. Neck: Supple, no elevated JVP or carotid bruits, no thyromegaly. Lungs: Clear to auscultation, nonlabored breathing at rest. Cardiac: Regular rate and rhythm, no S3 or significant systolic murmur. Abdomen: Soft, nontender, bowel sounds present. Extremities: No pitting edema, distal pulses  2+. Skin: Warm and dry. Musculoskeletal: No kyphosis. Neuropsychiatric: Alert and oriented x3, affect grossly appropriate.  ECG: I personally reviewed the tracing from 06/16/2017 which showed sinus rhythm with nonspecific T wave changes.  Recent Labwork: 08/10/2018: ALT 32; AST 21; BUN 17; Creatinine, Ser 1.10; Hemoglobin 13.2; Platelets 315; Potassium 3.8; Sodium 139   Other Studies Reviewed Today:  Echocardiogram 07/01/2017: Study Conclusions  - Left ventricle: The cavity size was normal. Wall thickness was   increased in a pattern of mild to moderate LVH. Systolic function   was normal. The estimated ejection fraction was in the range of   55% to 60%. Wall motion was normal; there were no regional wall   motion abnormalities. Indeterminate grade diastolic dysfunction.  Cardiac monitor 07/04/2017:  Sinus rhythm with occasional PAC's and PVC's.  Symptoms correlated with sinus rhythm.  Chest x-ray 06/23/2018: FINDINGS: Lungs are clear. Heart size and pulmonary vascularity are normal. No adenopathy. No pneumothorax or pneumomediastinum. No bone lesions evident.  IMPRESSION: No abnormality noted.  Assessment and Plan:  1.  Precordial pain, largely atypical features but with family history of premature CAD and personal history of impaired glucose tolerance as well as hyperlipidemia.  Recent ECG normal.  Echocardiogram from last year showed normal LVEF at 55 to 60%.  She has not undergone formal ischemic testing and we will arrange an exercise Myoview, hold Bystolic on the morning of the study.  2.  Mixed hyperlipidemia, she is on Pravachol with follow-up per Dr. Gerarda Fraction.  3.  Impaired glucose tolerance, followed by Dr. Gerarda Fraction.  Recent random glucose 115.  Current medicines were reviewed with the patient today.   Orders Placed This Encounter  Procedures  . NM Myocar Multi W/Spect W/Wall Motion / EF  . EKG 12-Lead    Disposition: Call with test results.  Signed, Satira Sark, MD, [facility] 08/13/2018 2:35 PM    [facility] [address], [place], [place] [address] Phone: [phone]; Fax: [phone]

## 2018-08-13 NOTE — Patient Instructions (Signed)
Medication Instructions:  none If you need a refill on your cardiac medications before your next appointment, please call your pharmacy.   Lab work: none If you have labs (blood work) drawn today and your tests are completely normal, you will receive your results only by: Marland Kitchen MyChart Message (if you have MyChart) OR . A paper copy in the mail If you have any lab test that is abnormal or we need to change your treatment, we will call you to review the results.  Testing/Procedures: Your physician has requested that you have en exercise stress myoview. For further information please visit [email or web address]. Please follow instruction sheet, as given. HOLD BYSTOLIC DAY OF TEST   Follow-Up:  We will call you with results  Any Other Special Instructions Will Be Listed Below (If Applicable). NONE

## 2018-08-18 ENCOUNTER — Encounter (HOSPITAL_COMMUNITY): Payer: BLUE CROSS/BLUE SHIELD

## 2018-08-19 ENCOUNTER — Encounter

## 2018-08-19 ENCOUNTER — Ambulatory Visit: Payer: BLUE CROSS/BLUE SHIELD | Admitting: Cardiology

## 2018-10-16 ENCOUNTER — Other Ambulatory Visit (HOSPITAL_COMMUNITY): Payer: Self-pay | Admitting: Obstetrics and Gynecology

## 2018-10-16 DIAGNOSIS — Z1231 Encounter for screening mammogram for malignant neoplasm of breast: Secondary | ICD-10-CM

## 2018-10-26 ENCOUNTER — Encounter (HOSPITAL_COMMUNITY): Payer: Self-pay

## 2018-10-26 ENCOUNTER — Ambulatory Visit (HOSPITAL_COMMUNITY)
Admission: RE | Admit: 2018-10-26 | Discharge: 2018-10-26 | Disposition: A | Discharge status: Home / Self Care | Payer: BLUE CROSS/BLUE SHIELD | Source: Ambulatory Visit | Attending: Obstetrics and Gynecology | Admitting: Obstetrics and Gynecology

## 2018-10-26 DIAGNOSIS — Z1231 Encounter for screening mammogram for malignant neoplasm of breast: Secondary | ICD-10-CM | POA: Diagnosis not present

## 2018-12-12 ENCOUNTER — Encounter (HOSPITAL_COMMUNITY): Payer: Self-pay

## 2018-12-12 ENCOUNTER — Emergency Department (HOSPITAL_COMMUNITY)
Admission: EM | Admit: 2018-12-12 | Discharge: 2018-12-12 | Disposition: A | Discharge status: Home / Self Care | Payer: BLUE CROSS/BLUE SHIELD | Attending: Emergency Medicine | Admitting: Emergency Medicine

## 2018-12-12 ENCOUNTER — Other Ambulatory Visit: Payer: Self-pay

## 2018-12-12 ENCOUNTER — Emergency Department (HOSPITAL_COMMUNITY): Payer: BLUE CROSS/BLUE SHIELD

## 2018-12-12 DIAGNOSIS — Z7982 Long term (current) use of aspirin: Secondary | ICD-10-CM | POA: Insufficient documentation

## 2018-12-12 DIAGNOSIS — R1032 Left lower quadrant pain: Secondary | ICD-10-CM | POA: Diagnosis present

## 2018-12-12 DIAGNOSIS — Z79899 Other long term (current) drug therapy: Secondary | ICD-10-CM | POA: Insufficient documentation

## 2018-12-12 DIAGNOSIS — N23 Unspecified renal colic: Secondary | ICD-10-CM

## 2018-12-12 DIAGNOSIS — N201 Calculus of ureter: Secondary | ICD-10-CM

## 2018-12-12 HISTORY — DX: Cyst of kidney, acquired: N28.1

## 2018-12-12 LAB — URINALYSIS, ROUTINE W REFLEX MICROSCOPIC
Bilirubin Urine: NEGATIVE
GLUCOSE, UA: 150 mg/dL — AB
KETONES UR: NEGATIVE mg/dL
Leukocytes,Ua: NEGATIVE
Nitrite: NEGATIVE
PH: 6 (ref 5.0–8.0)
Protein, ur: NEGATIVE mg/dL
Specific Gravity, Urine: 1.008 (ref 1.005–1.030)

## 2018-12-12 LAB — CBC WITH DIFFERENTIAL/PLATELET
Abs Immature Granulocytes: 0.02 10*3/uL (ref 0.00–0.07)
Basophils Absolute: 0 10*3/uL (ref 0.0–0.1)
Basophils Relative: 0 %
EOS ABS: 0.2 10*3/uL (ref 0.0–0.5)
Eosinophils Relative: 2 %
HCT: 43.2 % (ref 36.0–46.0)
Hemoglobin: 13.3 g/dL (ref 12.0–15.0)
Immature Granulocytes: 0 %
Lymphocytes Relative: 24 %
Lymphs Abs: 2 10*3/uL (ref 0.7–4.0)
MCH: 27.7 pg (ref 26.0–34.0)
MCHC: 30.8 g/dL (ref 30.0–36.0)
MCV: 90 fL (ref 80.0–100.0)
MONO ABS: 0.4 10*3/uL (ref 0.1–1.0)
MONOS PCT: 4 %
Neutro Abs: 5.8 10*3/uL (ref 1.7–7.7)
Neutrophils Relative %: 70 %
PLATELETS: 356 10*3/uL (ref 150–400)
RBC: 4.8 MIL/uL (ref 3.87–5.11)
RDW: 13.9 % (ref 11.5–15.5)
WBC: 8.4 10*3/uL (ref 4.0–10.5)
nRBC: 0 % (ref 0.0–0.2)

## 2018-12-12 LAB — COMPREHENSIVE METABOLIC PANEL
ALBUMIN: 4 g/dL (ref 3.5–5.0)
ALT: 17 U/L (ref 0–44)
AST: 17 U/L (ref 15–41)
Alkaline Phosphatase: 58 U/L (ref 38–126)
Anion gap: 10 (ref 5–15)
BILIRUBIN TOTAL: 0.4 mg/dL (ref 0.3–1.2)
BUN: 13 mg/dL (ref 6–20)
CO2: 20 mmol/L — ABNORMAL LOW (ref 22–32)
Calcium: 8.9 mg/dL (ref 8.9–10.3)
Chloride: 107 mmol/L (ref 98–111)
Creatinine, Ser: 1.04 mg/dL — ABNORMAL HIGH (ref 0.44–1.00)
Glucose, Bld: 187 mg/dL — ABNORMAL HIGH (ref 70–99)
POTASSIUM: 3.1 mmol/L — AB (ref 3.5–5.1)
Sodium: 137 mmol/L (ref 135–145)
TOTAL PROTEIN: 7.1 g/dL (ref 6.5–8.1)

## 2018-12-12 LAB — LIPASE, BLOOD: LIPASE: 33 U/L (ref 11–51)

## 2018-12-12 MED ORDER — ONDANSETRON 4 MG PO TBDP: 4.0000 mg | ORAL_TABLET | Freq: Three times a day (TID) | ORAL | 0 refills | Status: DC | PRN

## 2018-12-12 MED ORDER — DICYCLOMINE HCL 10 MG/ML IM SOLN
20.0000 mg | Freq: Once | INTRAMUSCULAR | Status: AC
  Administered 2018-12-12: 20 mg via INTRAMUSCULAR
  Filled 2018-12-12: qty 2

## 2018-12-12 MED ORDER — MORPHINE SULFATE (PF) 4 MG/ML IV SOLN
4.0000 mg | INTRAVENOUS | Status: AC | PRN
  Administered 2018-12-12 (×2): 4 mg via INTRAVENOUS
  Filled 2018-12-12 (×2): qty 1

## 2018-12-12 MED ORDER — ONDANSETRON HCL 4 MG/2ML IJ SOLN
4.0000 mg | INTRAMUSCULAR | Status: DC | PRN
  Administered 2018-12-12: 4 mg via INTRAVENOUS
  Filled 2018-12-12: qty 2

## 2018-12-12 MED ORDER — OXYCODONE-ACETAMINOPHEN 5-325 MG PO TABS: ORAL_TABLET | ORAL | 0 refills | Status: DC

## 2018-12-12 MED ORDER — IOHEXOL 300 MG/ML  SOLN
100.0000 mL | Freq: Once | INTRAMUSCULAR | Status: AC | PRN
  Administered 2018-12-12: 100 mL via INTRAVENOUS

## 2018-12-12 MED ORDER — OXYCODONE-ACETAMINOPHEN 5-325 MG PO TABS
2.0000 | ORAL_TABLET | Freq: Once | ORAL | Status: AC
  Administered 2018-12-12: 2 via ORAL
  Filled 2018-12-12: qty 2

## 2018-12-12 MED ORDER — TAMSULOSIN HCL 0.4 MG PO CAPS: 0.4000 mg | ORAL_CAPSULE | Freq: Every day | ORAL | 0 refills | Status: DC

## 2018-12-12 NOTE — ED Provider Notes (Signed)
[facility] EMERGENCY DEPARTMENT Provider Note   CSN: [id number] Arrival date & time: 12/12/18  4765    History   Chief Complaint Chief Complaint  Patient presents with  . Back Pain  . Abdominal Pain    HPI Jenna Chavez is a 53 y.o. female.      Back Pain  Associated symptoms: abdominal pain   Abdominal Pain    Pt was seen at 0720.  Per pt, c/o gradual onset and persistence of constant left sided abd "pain" for the past 3 days. Pt states 1 week ago she had right sided abd pain. Has been associated with no other symptoms.  Describes the abd pain as radiating into her left back.  Denies N/V, no diarrhea, no fevers, no back pain, no rash, no CP/SOB, no black or blood in stools.      Past Medical History:  Diagnosis Date  . Anemia    2008  . Constipation   . Gastric ulcer 1999   EGD per Dr. Gala Romney, negative for malignancy  . GERD (gastroesophageal reflux disease)   . Glucose intolerance (impaired glucose tolerance)   . Renal cyst, left     Patient Active Problem List   Diagnosis Date Noted  . Epigastric pain 03/04/2012  . Abdominal pain 01/26/2012  . Constipation 01/26/2012  . Dyspepsia 01/26/2012  . LLQ abdominal pain 06/13/2011    Past Surgical History:  Procedure Laterality Date  . CHOLECYSTECTOMY    . COLONOSCOPY  06/21/2011   RMR: normal rectum, colon, TI, repeat Aug 2017  . COLONOSCOPY N/A 04/10/2017   Procedure: COLONOSCOPY;  Surgeon: Daneil Dolin, MD;  Location: [facility];  Service: Endoscopy;  Laterality: N/A;  1:45pm  . ESOPHAGOGASTRODUODENOSCOPY  06/21/2011   Planned but not done at time of TCS, no need  . OOPHORECTOMY     and fallopian tube on left side  . PARTIAL HYSTERECTOMY    . Right toe surgery    . WISDOM TOOTH EXTRACTION       OB History   No obstetric history on file.      Home Medications    Prior to Admission medications   Medication Sig Start Date End Date Taking? Authorizing Provider  aspirin EC 81 MG tablet Take 81  mg by mouth daily.    [provider]  Boric Acid POWD Place 1 capsule vaginally once a week.    [provider]  BYSTOLIC 5 MG tablet Take 1 tablet daily by mouth. 09/05/17   [provider]  pantoprazole (PROTONIX) 20 MG tablet Take 1 tablet (20 mg total) by mouth daily. 08/10/18   Milton Ferguson, MD  phentermine 37.5 MG capsule Take 37.5 mg daily by mouth.    [provider]  pravastatin (PRAVACHOL) 20 MG tablet Take 20 mg by mouth daily.    [provider]  traMADol (ULTRAM) 50 MG tablet Take 1 tablet (50 mg total) by mouth every 6 (six) hours as needed. 08/10/18   Milton Ferguson, MD    Family History Family History  Problem Relation Age of Onset  . Colon polyps Father        onset early age  . Colon cancer Neg Hx     Social History Social History   Tobacco Use  . Smoking status: Never Smoker  . Smokeless tobacco: Never Used  Substance Use Topics  . Alcohol use: No  . Drug use: No     Allergies   Dilaudid [hydromorphone hcl]  Review of Systems Review of Systems  Gastrointestinal: Positive for abdominal pain.  Musculoskeletal: Positive for back pain.  ROS: Statement: All systems negative except as marked or noted in the HPI; Constitutional: Negative for fever and chills. ; ; Eyes: Negative for eye pain, redness and discharge. ; ; ENMT: Negative for ear pain, hoarseness, nasal congestion, sinus pressure and sore throat. ; ; Cardiovascular: Negative for chest pain, palpitations, diaphoresis, dyspnea and peripheral edema. ; ; Respiratory: Negative for cough, wheezing and stridor. ; ; Gastrointestinal: +abd pain. Negative for nausea, vomiting, diarrhea, blood in stool, hematemesis, jaundice and rectal bleeding. . ; ; Genitourinary: Negative for dysuria, flank pain and hematuria. ; ; Musculoskeletal: Negative for back pain and neck pain. Negative for swelling and trauma.; ; Skin: Negative for pruritus, rash, abrasions, blisters,  bruising and skin lesion.; ; Neuro: Negative for headache, lightheadedness and neck stiffness. Negative for weakness, altered level of consciousness, altered mental status, extremity weakness, paresthesias, involuntary movement, seizure and syncope.       Physical Exam Updated Vital Signs BP (!) 181/97 (BP Location: Right Arm)   Pulse (!) 113   Temp 98 F (36.7 C) (Oral)   Resp 14   Ht 5\' 6"  (1.676 m)   Wt 91.2 kg   SpO2 97%   BMI 32.44 kg/m    BP (!) 141/79 (BP Location: Right Arm)   Pulse (!) 104   Temp 98 F (36.7 C) (Oral)   Resp 14   Ht 5\' 6"  (1.676 m)   Wt 91.2 kg   SpO2 100%   BMI 32.44 kg/m    Physical Exam 0725: Physical examination:  Nursing notes reviewed; Vital signs and O2 SAT reviewed;  Constitutional: Well developed, Well nourished, Well hydrated, In no acute distress; Head:  Normocephalic, atraumatic; Eyes: EOMI, PERRL, No scleral icterus; ENMT: Mouth and pharynx normal, Mucous membranes moist; Neck: Supple, Full range of motion, No lymphadenopathy; Cardiovascular: Regular rate and rhythm, No gallop; Respiratory: Breath sounds clear & equal bilaterally, No wheezes.  Speaking full sentences with ease, Normal respiratory effort/excursion; Chest: Nontender, Movement normal; Abdomen: Soft, +diffuse tenderness to palp. No rebound or guarding. Nondistended, Normal bowel sounds; Genitourinary: No CVA tenderness; Extremities: Peripheral pulses normal, No tenderness, No edema, No calf edema or asymmetry.; Neuro: AA&Ox3, Major CN grossly intact.  Speech clear. No gross focal motor or sensory deficits in extremities.; Skin: Color normal, Warm, Dry.    ED Treatments / Results  Labs (all labs ordered are listed, but only abnormal results are displayed)   EKG None  Radiology   Procedures Procedures (including critical care time)  Medications Ordered in ED Medications  morphine 4 MG/ML injection 4 mg (4 mg Intravenous Given 12/12/18 0732)  ondansetron (ZOFRAN)  injection 4 mg (4 mg Intravenous Given 12/12/18 0732)  dicyclomine (BENTYL) injection 20 mg (20 mg Intramuscular Given 12/12/18 0732)     Initial Impression / Assessment and Plan / ED Course  I have reviewed the triage vital signs and the nursing notes.  Pertinent labs & imaging results that were available during my care of the patient were reviewed by me and considered in my medical decision making (see chart for details).     MDM Reviewed: nursing note, previous chart and vitals Reviewed previous: labs Interpretation: labs and CT scan   Results for orders placed or performed during the hospital encounter of 12/12/18  Urinalysis, Routine w reflex microscopic  Result Value Ref Range   Color, Urine YELLOW YELLOW   APPearance CLEAR  CLEAR   Specific Gravity, Urine 1.008 1.005 - 1.030   pH 6.0 5.0 - 8.0   Glucose, UA 150 (A) NEGATIVE mg/dL   Hgb urine dipstick LARGE (A) NEGATIVE   Bilirubin Urine NEGATIVE NEGATIVE   Ketones, ur NEGATIVE NEGATIVE mg/dL   Protein, ur NEGATIVE NEGATIVE mg/dL   Nitrite NEGATIVE NEGATIVE   Leukocytes,Ua NEGATIVE NEGATIVE   RBC / HPF >50 (H) 0 - 5 RBC/hpf   WBC, UA 0-5 0 - 5 WBC/hpf   Bacteria, UA RARE (A) NONE SEEN   Squamous Epithelial / LPF 0-5 0 - 5   Mucus PRESENT   Comprehensive metabolic panel  Result Value Ref Range   Sodium 137 135 - 145 mmol/L   Potassium 3.1 (L) 3.5 - 5.1 mmol/L   Chloride 107 98 - 111 mmol/L   CO2 20 (L) 22 - 32 mmol/L   Glucose, Bld 187 (H) 70 - 99 mg/dL   BUN 13 6 - 20 mg/dL   Creatinine, Ser 1.04 (H) 0.44 - 1.00 mg/dL   Calcium 8.9 8.9 - 10.3 mg/dL   Total Protein 7.1 6.5 - 8.1 g/dL   Albumin 4.0 3.5 - 5.0 g/dL   AST 17 15 - 41 U/L   ALT 17 0 - 44 U/L   Alkaline Phosphatase 58 38 - 126 U/L   Total Bilirubin 0.4 0.3 - 1.2 mg/dL   GFR calc non Af Amer >60 >60 mL/min   GFR calc Af Amer >60 >60 mL/min   Anion gap 10 5 - 15  Lipase, blood  Result Value Ref Range   Lipase 33 11 - 51 U/L  CBC with Differential    Result Value Ref Range   WBC 8.4 4.0 - 10.5 K/uL   RBC 4.80 3.87 - 5.11 MIL/uL   Hemoglobin 13.3 12.0 - 15.0 g/dL   HCT 43.2 36.0 - 46.0 %   MCV 90.0 80.0 - 100.0 fL   MCH 27.7 26.0 - 34.0 pg   MCHC 30.8 30.0 - 36.0 g/dL   RDW 13.9 11.5 - 15.5 %   Platelets 356 150 - 400 K/uL   nRBC 0.0 0.0 - 0.2 %   Neutrophils Relative % 70 %   Neutro Abs 5.8 1.7 - 7.7 K/uL   Lymphocytes Relative 24 %   Lymphs Abs 2.0 0.7 - 4.0 K/uL   Monocytes Relative 4 %   Monocytes Absolute 0.4 0.1 - 1.0 K/uL   Eosinophils Relative 2 %   Eosinophils Absolute 0.2 0.0 - 0.5 K/uL   Basophils Relative 0 %   Basophils Absolute 0.0 0.0 - 0.1 K/uL   Immature Granulocytes 0 %   Abs Immature Granulocytes 0.02 0.00 - 0.07 K/uL   Ct Abdomen Pelvis W Contrast Result Date: 12/12/2018 CLINICAL DATA:  53 year old female with acute abdominal and pelvic pain for several days. EXAM: CT ABDOMEN AND PELVIS WITH CONTRAST TECHNIQUE: Multidetector CT imaging of the abdomen and pelvis was performed using the standard protocol following bolus administration of intravenous contrast. CONTRAST:  167mL OMNIPAQUE IOHEXOL 300 MG/ML  SOLN COMPARISON:  08/10/2018 MR and CT, and prior studies FINDINGS: Lower chest: No acute abnormality. Hepatobiliary: The liver is unremarkable. The patient is status post cholecystectomy. CBD measures 11 mm, slightly increased in size from 08/10/2018. CBD dilatation extends to the ampulla without obstructing cause noted. 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 A 2 mm calculus within the proximal  ureter of the LEFT LOWER moiety is identified causing mild LOWER moiety hydronephrosis. A LEFT parapelvic cyst is again identified. No other renal abnormalities are identified. The adrenal glands and bladder are unremarkable. Stomach/Bowel: Stomach is within normal limits. Appendix  appears normal. No evidence of bowel wall thickening, distention, or inflammatory changes. Vascular/Lymphatic: No significant vascular findings are present. No enlarged abdominal or pelvic lymph nodes. Reproductive: Status post hysterectomy. No adnexal masses. Other: No ascites, pneumoperitoneum or focal collection/abscess. Musculoskeletal: No acute or suspicious bony abnormalities are identified. IMPRESSION: 1. 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 2. Mild CBD dilatation without obstructing cause. CBD appears to have increased in size since 08/10/2018 MR. Consider GI follow-up. 3. Electronically Signed   By: Margarette Canada M.D.   On: 12/12/2018 09:39     1000:  CT as above. No UTI on Udip, BUN/Cr per baseline. T/C returned from Uro Dr. Alinda Money, case discussed, including:  HPI, pertinent PM/SHx, VS/PE, dx testing, ED course and treatment:  Can treat pt per usual for ureteral colic, f/u office prn.   1040:  Pt has tol PO well without N/V. Feels better after meds and wants to go home now. Dx and testing, as well as d/w Uro MD, d/w pt and family.  Questions answered.  Verb understanding, agreeable to d/c home with outpt f/u.    Final Clinical Impressions(s) / ED Diagnoses   Final diagnoses:  None    ED Discharge Orders    None       Francine Graven, DO 12/16/18 [id number]

## 2018-12-12 NOTE — Discharge Instructions (Addendum)
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 Mild CBD dilatation without obstructing cause. CBD appears to have increased in size since 08/10/2018 MR. Consider GI follow-up."  Call your GI doctor on Monday to schedule a follow up appointment for those findings. Take the prescriptions as directed.  Call your regular medical doctor and the Urologist on Monday to schedule a follow up appointment this week.   Return to the Emergency Department immediately sooner if worsening.

## 2018-12-12 NOTE — ED Triage Notes (Signed)
Pt is having left and right sided lower back pain that started a few days ago. Also states pain radiates to her "womb". Has a cyst on her kidney that is the size of a golf ball. States she has been able to make stool and denies any urinary symptoms.

## 2018-12-12 NOTE — ED Notes (Signed)
Pt left with sister driving

## 2018-12-12 NOTE — ED Notes (Signed)
Left and right sided flank pain with lower abdominal pain.

## 2018-12-13 LAB — URINE CULTURE: Culture: NO GROWTH

## 2018-12-31 ENCOUNTER — Encounter: Payer: Self-pay | Admitting: Internal Medicine

## 2019-03-08 ENCOUNTER — Ambulatory Visit (INDEPENDENT_AMBULATORY_CARE_PROVIDER_SITE_OTHER): Payer: BLUE CROSS/BLUE SHIELD | Admitting: Nurse Practitioner

## 2019-03-08 ENCOUNTER — Other Ambulatory Visit: Payer: Self-pay

## 2019-03-08 ENCOUNTER — Encounter: Payer: Self-pay | Admitting: Nurse Practitioner

## 2019-03-08 DIAGNOSIS — R1084 Generalized abdominal pain: Secondary | ICD-10-CM | POA: Diagnosis not present

## 2019-03-08 DIAGNOSIS — R1013 Epigastric pain: Secondary | ICD-10-CM

## 2019-03-08 NOTE — Progress Notes (Signed)
CC'D TO PCP °

## 2019-03-08 NOTE — Patient Instructions (Signed)
Your health issues we discussed today were:   Abdominal pain: 1. I am glad your pain is improved 2. Continue your current medications let us know if it returns  History of GERD (reflux/heartburn): 1. Continue taking Protonix as you have been 2. If you have a flareup of your GERD symptoms urinary you can use Tums or Rolaids 3. Let us know if your flares become more consistent  Overall I recommend:  1. Continue your other medications 2. Return for follow-up as needed or in 2023 when you are due for your updated colonoscopy   Because of recent events of COVID-19 ("Coronavirus"), follow CDC recommendations:  1. Wash your hand frequently 2. Avoid touching your face 3. Stay away from people who are sick 4. If you have symptoms such as fever, cough, shortness of breath then call your healthcare provider for further guidance 5. If you are sick, STAY AT HOME unless otherwise directed by your healthcare provider. 6. Follow directions from state and national officials regarding staying safe   At [facility] we value your feedback. You may receive a survey about your visit today. Please share your experience as we strive to create trusting relationships with our patients to provide genuine, compassionate, quality care.  We appreciate your understanding and patience as we review any laboratory studies, imaging, and other diagnostic tests that are ordered as we care for you. Our office policy is 5 business days for review of these results, and any emergent or urgent results are addressed in a timely manner for your best interest. If you do not hear from our office in 1 week, please contact us.   We also encourage the use of MyChart, which contains your medical information for your review as well. If you are not enrolled in this feature, an access code is on this after visit summary for your convenience. Thank you for allowing [place] to be involved in your care.  It was great to see you  today!  I hope you have a great day!!

## 2019-03-08 NOTE — Assessment & Plan Note (Signed)
Some epigastric pain was noted which is likely a flare of her GERD symptoms given her ongoing stress with dealing with a kidney stone.  She is currently generally asymptomatic.  Recommend she continue her Protonix as she is taking now.  She can use Tums or Rolaids for intermittent flares.  Notify us if flares become more consistent.

## 2019-03-08 NOTE — Assessment & Plan Note (Signed)
Previous abdominal pain which is mostly in her flank and left side/right side has resolved since passing her kidney stone.  She did have some epigastric/upper abdominal pain as well.  She does have a history of GERD and is on Protonix and was likely having a flare given the stress-induced by her kidney stone.  She is currently completely asymptomatic from a GI standpoint.  Recommend she continue her Protonix.  She does have a flare of GERD she can use Tums/Rolaids unless it becomes more regular at which point she can follow-up.  Recommend she follow-up as needed or in 2023 for her updated colonoscopy.

## 2019-03-08 NOTE — Progress Notes (Signed)
Referring Provider: Redmond School, MD Primary Care Physician:  Redmond School, MD Primary GI:  Dr. Gala Romney  NOTE: Service was provided via telemedicine and was requested by the patient due to COVID-19 pandemic.  Method of visit: [email or web address]  Patient Location: [facility], clinical (histocompatibility and immunogenetics) Location: Office  Reason for Phone Visit: PCP Referral  The patient was consented to phone follow-up via telephone encounter including billing of the encounter (yes/no): Yes  Persons present on the phone encounter, with roles: None  Total time (minutes) spent on medical discussion: 15 minutes  Chief Complaint  Patient presents with   Abdominal Pain    doing better since referral    HPI:   Jenna Chavez is a 53 y.o. female who presents for virtual visit regarding: On referral from primary care to schedule first ever colonoscopy.  Nurse/phone triage was deferred office visit due to complaints of abdominal pain.  Reviewed information provided with referral including office visit dated 12/15/2018.  At that time it was an ER follow-up for kidney stones.  She complained of abdominal pain.  Because of this she was referred to GI with possible EGD needed.  She was also treated for kidney stones.  Previous colonoscopy dated 04/10/2017 due to high risk of colon cancer given primary relative with CRC.  This colonoscopy found: Normal, no polyps.  Recommended colonoscopy in 5 years (2023).  Stop Linzess, begin MiraLAX daily, recommended biofeedback for pelvic floor dyssynergy based on previous recommendations.  Follow-up in 4 weeks.  Previous colonoscopy dated 06/21/2011 was also normal.  No history of endoscopy.  Today she states she's doing well overall. Her abdominal pain has improved since her referral. No further abdominal pain. When she had it, it was in her right side, back, flank, left side. Some minimal upper abdominal pain. No recent pain, N/V, hematochezia, melena, fever, chills, unintentional weight  loss. Denies URI and flu-like symptoms. Denies loss of sense of taste/smell.   Past Medical History:  Diagnosis Date   Anemia    2008   Constipation    Gastric ulcer 1999   EGD per Dr. Gala Romney, negative for malignancy   GERD (gastroesophageal reflux disease)    Glucose intolerance (impaired glucose tolerance)    Renal cyst, left     Past Surgical History:  Procedure Laterality Date   CHOLECYSTECTOMY     COLONOSCOPY  06/21/2011   RMR: normal rectum, colon, TI, repeat Aug 2017   COLONOSCOPY N/A 04/10/2017   Procedure: COLONOSCOPY;  Surgeon: Daneil Dolin, MD;  Location: [facility];  Service: Endoscopy;  Laterality: N/A;  1:45pm   ESOPHAGOGASTRODUODENOSCOPY  06/21/2011   Planned but not done at time of TCS, no need   OOPHORECTOMY     and fallopian tube on left side   PARTIAL HYSTERECTOMY     Right toe surgery     WISDOM TOOTH EXTRACTION      Current Outpatient Medications  Medication Sig Dispense Refill   aspirin EC 81 MG tablet Take 81 mg by mouth daily.     Boric Acid POWD Place 1 capsule vaginally as needed.      BYSTOLIC 5 MG tablet Take 10 mg by mouth daily.      OVER THE COUNTER MEDICATION Drink to shrink 8oz twice daily     pantoprazole (PROTONIX) 20 MG tablet Take 1 tablet (20 mg total) by mouth daily. 30 tablet 0   No current facility-administered medications for this visit.     Allergies as of 03/08/2019 -  Review Complete 03/08/2019  Allergen Reaction Noted   Hydrocodone  12/12/2018   Dilaudid [hydromorphone hcl]  12/12/2018    Family History  Problem Relation Age of Onset   Colon polyps Father        onset early age   Colon cancer Neg Hx     Social History   Socioeconomic History   Marital status: Married    Spouse name: Not on file   Number of children: Not on file   Years of education: Not on file   Highest education level: Not on file  Occupational History   Not on file  Social Needs   Financial resource strain:  Not on file   Food insecurity:    Worry: Not on file    Inability: Not on file   Transportation needs:    Medical: Not on file    Non-medical: Not on file  Tobacco Use   Smoking status: Never Smoker   Smokeless tobacco: Never Used  Substance and Sexual Activity   Alcohol use: No   Drug use: No   Sexual activity: Not on file  Lifestyle   Physical activity:    Days per week: Not on file    Minutes per session: Not on file   Stress: Not on file  Relationships   Social connections:    Talks on phone: Not on file    Gets together: Not on file    Attends religious service: Not on file    Active member of club or organization: Not on file    Attends meetings of clubs or organizations: Not on file    Relationship status: Not on file  Other Topics Concern   Not on file  Social History Narrative   Not on file    Review of Systems: General: Negative for anorexia, weight loss, fever, chills, fatigue, weakness. ENT: Negative for hoarseness, difficulty swallowing. CV: Negative for chest pain, angina, palpitations, peripheral edema.  Respiratory: Negative for dyspnea at rest, cough, sputum, wheezing.  GI: See history of present illness. Endo: Negative for unusual weight change.  Heme: Negative for bruising or bleeding. Allergy: Negative for rash or hives.  Physical Exam: Note: limited exam due to virtual visit General:   Alert and oriented. Pleasant and cooperative. Well-nourished and well-developed.  Head:  Normocephalic and atraumatic. Eyes:  Without icterus, sclera clear and conjunctiva pink.  Ears:  Normal auditory acuity. Skin:  Intact without facial significant lesions or rashes. Neurologic:  Alert and oriented x4;  grossly normal neurologically. Psych:  Alert and cooperative. Normal mood and affect. Heme/Lymph/Immune: No excessive bruising noted.

## 2019-04-01 ENCOUNTER — Ambulatory Visit (HOSPITAL_COMMUNITY): Admission: RE | Admit: 2019-04-01 | Payer: BC Managed Care – PPO | Source: Ambulatory Visit

## 2019-04-01 ENCOUNTER — Other Ambulatory Visit: Payer: Self-pay | Admitting: Internal Medicine

## 2019-04-01 ENCOUNTER — Other Ambulatory Visit (HOSPITAL_COMMUNITY): Payer: Self-pay | Admitting: Internal Medicine

## 2019-04-01 ENCOUNTER — Other Ambulatory Visit: Payer: Self-pay

## 2019-04-01 ENCOUNTER — Ambulatory Visit (HOSPITAL_COMMUNITY)
Admission: RE | Admit: 2019-04-01 | Discharge: 2019-04-01 | Disposition: A | Discharge status: Home / Self Care | Payer: BC Managed Care – PPO | Source: Ambulatory Visit | Attending: Internal Medicine | Admitting: Internal Medicine

## 2019-04-01 ENCOUNTER — Encounter (HOSPITAL_COMMUNITY): Payer: Self-pay

## 2019-04-01 DIAGNOSIS — R319 Hematuria, unspecified: Secondary | ICD-10-CM

## 2019-04-01 DIAGNOSIS — R109 Unspecified abdominal pain: Secondary | ICD-10-CM | POA: Insufficient documentation

## 2019-04-01 MED ORDER — IOHEXOL 300 MG/ML  SOLN
100.0000 mL | Freq: Once | INTRAMUSCULAR | Status: AC | PRN
  Administered 2019-04-01: 16:00:00 100 mL via INTRAVENOUS

## 2019-07-30 ENCOUNTER — Other Ambulatory Visit: Payer: Self-pay

## 2019-07-30 DIAGNOSIS — Z20822 Contact with and (suspected) exposure to covid-19: Secondary | ICD-10-CM

## 2019-08-01 LAB — NOVEL CORONAVIRUS, NAA: SARS-CoV-2, NAA: NOT DETECTED

## 2019-08-02 ENCOUNTER — Telehealth: Payer: Self-pay | Admitting: General Practice

## 2019-08-02 NOTE — Telephone Encounter (Signed)
Negative COVID results given. Patient results "NOT Detected." Caller expressed understanding. ° °

## 2019-08-19 DIAGNOSIS — E878 Other disorders of electrolyte and fluid balance, not elsewhere classified: Secondary | ICD-10-CM | POA: Insufficient documentation

## 2019-08-19 DIAGNOSIS — I10 Essential (primary) hypertension: Secondary | ICD-10-CM | POA: Insufficient documentation

## 2019-09-21 ENCOUNTER — Other Ambulatory Visit (HOSPITAL_COMMUNITY): Payer: Self-pay | Admitting: Obstetrics and Gynecology

## 2019-09-21 DIAGNOSIS — Z1231 Encounter for screening mammogram for malignant neoplasm of breast: Secondary | ICD-10-CM

## 2019-10-05 ENCOUNTER — Other Ambulatory Visit: Payer: Self-pay | Admitting: Urology

## 2019-10-28 ENCOUNTER — Other Ambulatory Visit: Payer: Self-pay

## 2019-10-28 ENCOUNTER — Ambulatory Visit (HOSPITAL_COMMUNITY)
Admission: RE | Admit: 2019-10-28 | Discharge: 2019-10-28 | Disposition: A | Discharge status: Home / Self Care | Payer: BC Managed Care – PPO | Source: Ambulatory Visit | Attending: Obstetrics and Gynecology | Admitting: Obstetrics and Gynecology

## 2019-10-28 DIAGNOSIS — Z1231 Encounter for screening mammogram for malignant neoplasm of breast: Secondary | ICD-10-CM | POA: Insufficient documentation

## 2019-10-29 ENCOUNTER — Ambulatory Visit (HOSPITAL_COMMUNITY): Payer: BC Managed Care – PPO

## 2019-11-01 ENCOUNTER — Other Ambulatory Visit (HOSPITAL_COMMUNITY): Payer: Self-pay | Admitting: Obstetrics and Gynecology

## 2019-11-01 DIAGNOSIS — R928 Other abnormal and inconclusive findings on diagnostic imaging of breast: Secondary | ICD-10-CM

## 2019-11-02 ENCOUNTER — Ambulatory Visit (HOSPITAL_COMMUNITY)
Admission: RE | Admit: 2019-11-02 | Discharge: 2019-11-02 | Disposition: A | Discharge status: Home / Self Care | Payer: BC Managed Care – PPO | Source: Ambulatory Visit | Attending: Obstetrics and Gynecology | Admitting: Obstetrics and Gynecology

## 2019-11-02 ENCOUNTER — Other Ambulatory Visit: Payer: Self-pay

## 2019-11-02 DIAGNOSIS — R928 Other abnormal and inconclusive findings on diagnostic imaging of breast: Secondary | ICD-10-CM

## 2019-11-03 ENCOUNTER — Other Ambulatory Visit (HOSPITAL_COMMUNITY): Payer: Self-pay | Admitting: Obstetrics and Gynecology

## 2019-11-05 ENCOUNTER — Other Ambulatory Visit: Payer: Self-pay | Admitting: Obstetrics and Gynecology

## 2019-11-05 ENCOUNTER — Other Ambulatory Visit: Payer: Self-pay | Admitting: Urology

## 2019-11-05 DIAGNOSIS — N6310 Unspecified lump in the right breast, unspecified quadrant: Secondary | ICD-10-CM

## 2019-11-05 NOTE — Patient Instructions (Addendum)
DUE TO COVID-19 ONLY ONE VISITOR IS ALLOWED TO COME WITH YOU AND STAY IN THE WAITING ROOM ONLY DURING PRE OP AND PROCEDURE DAY OF SURGERY. THE 1 VISITOR MAY VISIT WITH YOU AFTER SURGERY IN YOUR PRIVATE ROOM DURING VISITING HOURS ONLY!  YOU NEED TO HAVE A COVID 19 TEST ON: 11/06/19 @ 9:05 am, THIS TEST MUST BE DONE BEFORE SURGERY, COME  [place], [address] , [address].  ([place]) ONCE YOUR COVID TEST IS COMPLETED, PLEASE BEGIN THE QUARANTINE INSTRUCTIONS AS OUTLINED IN YOUR HANDOUT.                [place]     Your procedure is scheduled on: 11/10/2019     Report to [facility] Main  Entrance    Report to admitting at: 6:30 AM     Call this number if you have problems the morning of surgery [phone]    Remember:    [place], NO [place].     Take these medicines the morning of surgery with A SIP OF WATER: Bystolic,pantoprazole,pravastatin.                                 You may not have any metal on your body including hair pins and              piercings  Do not wear jewelry, make-up, lotions, powders or perfumes, deodorant             Do not wear nail polish on your fingernails.  Do not shave  48 hours prior to surgery.                  Do not bring valuables to the hospital. [place].  Contacts, dentures or bridgework may not be worn into surgery.  Leave suitcase in the car. After surgery it may be brought to your room.     Patients discharged the day of surgery will not be allowed to drive home. IF YOU ARE HAVING SURGERY AND GOING HOME THE SAME DAY, YOU MUST HAVE AN ADULT TO DRIVE YOU HOME AND BE WITH YOU FOR 24 HOURS. YOU MAY GO HOME BY TAXI OR UBER OR ORTHERWISE, BUT AN ADULT MUST ACCOMPANY YOU HOME AND STAY WITH YOU FOR 24 HOURS.  Name and phone number of your driver:  Special Instructions: N/A               Please read over the following fact sheets you were given: _____________________________________________________________________   Clear liquid diet starting on the day before your surgery.Nothing by mouth after midnight the night before surgery.             CLEAR LIQUID DIET   Foods Allowed                                                                     Foods Excluded  Coffee and tea, regular and decaf  liquids that you cannot  Plain Jell-O any favor except red or purple                                           see through such as: Fruit ices (not with fruit pulp)                                     milk, soups, orange juice  Iced Popsicles                                    All solid food Carbonated beverages, regular and diet                                    Cranberry, grape and apple juices Sports drinks like Gatorade Lightly seasoned clear broth or consume(fat free) Sugar, honey syrup  Sample Menu Breakfast                                Lunch                                     Supper Cranberry juice                    Beef broth                            Chicken broth Jell-O                                     Grape juice                           Apple juice Coffee or tea                        Jell-O                                      Popsicle                                                Coffee or tea                        Coffee or tea  _____________________________________________________________________    [facility] - Preparing for Surgery Before surgery, you can play an important role.  Because skin is not sterile, your skin needs to be as free of germs as possible.  You can reduce the number of germs on your skin by washing with CHG (chlorahexidine gluconate) soap before surgery.  CHG is an antiseptic cleaner  which kills germs and bonds with the skin to continue killing germs even after washing. Please DO NOT use if you  have an allergy to CHG or antibacterial soaps.  If your skin becomes reddened/irritated stop using the CHG and inform your nurse when you arrive at Short Stay. Do not shave (including legs and underarms) for at least 48 hours prior to the first CHG shower.  You may shave your face/neck. Please follow these instructions carefully:  1.  Shower with CHG Soap the night before surgery and the  morning of Surgery.  2.  If you choose to wash your hair, wash your hair first as usual with your  normal  shampoo.  3.  After you shampoo, rinse your hair and body thoroughly to remove the  shampoo.                           4.  Use CHG as you would any other liquid soap.  You can apply chg directly  to the skin and wash                       Gently with a scrungie or clean washcloth.  5.  Apply the CHG Soap to your body ONLY FROM THE NECK DOWN.   Do not use on face/ open                           Wound or open sores. Avoid contact with eyes, ears mouth and genitals (private parts).                       Wash face,  Genitals (private parts) with your normal soap.             6.  Wash thoroughly, paying special attention to the area where your surgery  will be performed.  7.  Thoroughly rinse your body with warm water from the neck down.  8.  DO NOT shower/wash with your normal soap after using and rinsing off  the CHG Soap.                9.  Pat yourself dry with a clean towel.            10.  Wear clean pajamas.            11.  Place clean sheets on your bed the night of your first shower and do not  sleep with pets. Day of Surgery : Do not apply any lotions/deodorants the morning of surgery.  Please wear clean clothes to the hospital/surgery center.  FAILURE TO FOLLOW THESE INSTRUCTIONS MAY RESULT IN THE CANCELLATION OF YOUR SURGERY PATIENT SIGNATURE_________________________________  NURSE  SIGNATURE__________________________________  ________________________________________________________________________

## 2019-11-06 ENCOUNTER — Other Ambulatory Visit (HOSPITAL_COMMUNITY)
Admission: RE | Admit: 2019-11-06 | Discharge: 2019-11-06 | Disposition: A | Discharge status: Home / Self Care | Payer: BC Managed Care – PPO | Source: Ambulatory Visit | Attending: Urology | Admitting: Urology

## 2019-11-06 DIAGNOSIS — Z20822 Contact with and (suspected) exposure to covid-19: Secondary | ICD-10-CM | POA: Diagnosis not present

## 2019-11-06 DIAGNOSIS — Z01812 Encounter for preprocedural laboratory examination: Secondary | ICD-10-CM | POA: Diagnosis present

## 2019-11-07 LAB — NOVEL CORONAVIRUS, NAA (HOSP ORDER, SEND-OUT TO REF LAB; TAT 18-24 HRS): SARS-CoV-2, NAA: NOT DETECTED

## 2019-11-08 ENCOUNTER — Encounter (HOSPITAL_COMMUNITY): Payer: Self-pay | Admitting: Physician Assistant

## 2019-11-09 ENCOUNTER — Encounter (HOSPITAL_COMMUNITY)
Admission: RE | Admit: 2019-11-09 | Discharge: 2019-11-09 | Disposition: A | Discharge status: Home / Self Care | Payer: BC Managed Care – PPO | Source: Ambulatory Visit | Attending: Urology | Admitting: Urology

## 2019-11-09 ENCOUNTER — Other Ambulatory Visit: Payer: Self-pay

## 2019-11-09 ENCOUNTER — Encounter (HOSPITAL_COMMUNITY): Payer: Self-pay

## 2019-11-09 DIAGNOSIS — Z8711 Personal history of peptic ulcer disease: Secondary | ICD-10-CM | POA: Insufficient documentation

## 2019-11-09 DIAGNOSIS — N2889 Other specified disorders of kidney and ureter: Secondary | ICD-10-CM | POA: Insufficient documentation

## 2019-11-09 DIAGNOSIS — I10 Essential (primary) hypertension: Secondary | ICD-10-CM | POA: Insufficient documentation

## 2019-11-09 DIAGNOSIS — Z87442 Personal history of urinary calculi: Secondary | ICD-10-CM | POA: Diagnosis not present

## 2019-11-09 DIAGNOSIS — Z8249 Family history of ischemic heart disease and other diseases of the circulatory system: Secondary | ICD-10-CM | POA: Insufficient documentation

## 2019-11-09 DIAGNOSIS — Z01818 Encounter for other preprocedural examination: Secondary | ICD-10-CM | POA: Diagnosis present

## 2019-11-09 DIAGNOSIS — Z79899 Other long term (current) drug therapy: Secondary | ICD-10-CM | POA: Insufficient documentation

## 2019-11-09 DIAGNOSIS — K219 Gastro-esophageal reflux disease without esophagitis: Secondary | ICD-10-CM | POA: Insufficient documentation

## 2019-11-09 HISTORY — DX: Essential (primary) hypertension: I10

## 2019-11-09 HISTORY — DX: Personal history of urinary calculi: Z87.442

## 2019-11-09 LAB — BASIC METABOLIC PANEL
Anion gap: 8 (ref 5–15)
BUN: 16 mg/dL (ref 6–20)
CO2: 24 mmol/L (ref 22–32)
Calcium: 9.4 mg/dL (ref 8.9–10.3)
Chloride: 108 mmol/L (ref 98–111)
Creatinine, Ser: 0.97 mg/dL (ref 0.44–1.00)
GFR calc Af Amer: >60 mL/min (ref >60)
GFR calc non Af Amer: >60 mL/min (ref >60)
Glucose, Bld: 124 mg/dL — ABNORMAL HIGH (ref 70–99)
Potassium: 3.7 mmol/L (ref 3.5–5.1)
Sodium: 140 mmol/L (ref 135–145)

## 2019-11-09 LAB — CBC
HCT: 44.8 % (ref 36.0–46.0)
Hemoglobin: 14.1 g/dL (ref 12.0–15.0)
MCH: 28.4 pg (ref 26.0–34.0)
MCHC: 31.5 g/dL (ref 30.0–36.0)
MCV: 90.3 fL (ref 80.0–100.0)
Platelets: 339 10*3/uL (ref 150–400)
RBC: 4.96 MIL/uL (ref 3.87–5.11)
RDW: 14.2 % (ref 11.5–15.5)
WBC: 8.9 10*3/uL (ref 4.0–10.5)
nRBC: 0 % (ref 0.0–0.2)

## 2019-11-09 NOTE — Progress Notes (Signed)
PCP - [employer]. LOV: 03/08/19 Cardiologist - Alla German.  Chest x-ray -  EKG - 11/09/19 Stress Test -  ECHO -  Cardiac Cath -   Sleep Study - yes CPAP - yes  Fasting Blood Sugar -  Checks Blood Sugar _____ times a day  Blood Thinner Instructions: Aspirin Instructions: Last Dose:  Anesthesia review:   Patient denies shortness of breath, fever, cough and chest pain at PAT appointment   Patient verbalized understanding of instructions that were given to them at the PAT appointment. Patient was also instructed that they will need to review over the PAT instructions again at home before surgery.

## 2019-11-09 NOTE — Progress Notes (Addendum)
Anesthesia Chart Review   Case: [id number] Date/Time: 11/10/19 0800   Procedure: XI ROBOTIC ASSITED PARTIAL NEPHRECTOMY (Left ) - 3 HRS   Anesthesia type: General   Pre-op diagnosis: LEFT CYSTIC RENAL MASS   Location: [facility] ROOM 03 / [facility]   Surgeons: Alexis Frock, MD      DISCUSSION:54 y.o. never smoker with h/o GERD, HTN, left cystic renal mass scheduled for above procedure 11/10/19 with Dr. Alexis Frock.    Pt last seen by cardiologist, Dr. Rozann Lesches, 07/2018.  Per OV note, "Precordial pain, largely atypical features but with family history of premature CAD and personal history of impaired glucose tolerance as well as hyperlipidemia.  Recent ECG normal.  Echocardiogram from last year showed normal LVEF at 55 to 60%.  She has not undergone formal ischemic testing and we will arrange an exercise Myoview, hold Bystolic on the morning of the study."  Pt did not follow up with cardiologist.   Discussed with Dr. Deatra Canter.  Pt needs clearance from cardiologist prior to proceeding. VM left with Dr. Zettie Pho scheduler.   VS: BP (!) 160/84   Pulse 85   Temp 37 C (Oral)   Resp 16   Ht 5\' 6"  (1.676 m)   Wt 92.3 kg   SpO2 100%   BMI 32.83 kg/m   PROVIDERS: Redmond School, MD is PCP  Rozann Lesches, MD is Cardiologist  LABS: Labs reviewed: Acceptable for surgery. (all labs ordered are listed, but only abnormal results are displayed)  Labs Reviewed  BASIC METABOLIC PANEL - Abnormal; Notable for the following components:      Result Value   Glucose, Bld 124 (*)    All other components within normal limits  CBC     IMAGES:   EKG: 11/09/19 Rate 75 bpm Normal sinus rhythm   CV: Echo 07/01/2017 Study Conclusions  - Left ventricle: The cavity size was normal. Wall thickness was   increased in a pattern of mild to moderate LVH. Systolic function   was normal. The estimated ejection fraction was in the range of   55% to 60%. Wall motion was normal; there were no  regional wall   motion abnormalities. Indeterminate grade diastolic dysfunction. Past Medical History:  Diagnosis Date  . Anemia    2008  . Anginal pain ([place])   . Constipation   . Gastric ulcer 1999   EGD per Dr. Gala Romney, negative for malignancy  . GERD (gastroesophageal reflux disease)   . Glucose intolerance (impaired glucose tolerance)   . History of kidney stones   . Hypertension   . Palpitations 2019  . Renal cyst, left     Past Surgical History:  Procedure Laterality Date  . CHOLECYSTECTOMY    . COLONOSCOPY  06/21/2011   RMR: normal rectum, colon, TI, repeat Aug 2017  . COLONOSCOPY N/A 04/10/2017   Procedure: COLONOSCOPY;  Surgeon: Daneil Dolin, MD;  Location[facility];  Service: Endoscopy;  Laterality: N/A;  1:45pm  . ESOPHAGOGASTRODUODENOSCOPY  06/21/2011   Planned but not done at time of TCS, no need  . OOPHORECTOMY     and fallopian tube on left side  . PARTIAL HYSTERECTOMY    . Right toe surgery    . WISDOM TOOTH EXTRACTION      MEDICATIONS: . BYSTOLIC 10 MG tablet  . nebivolol (BYSTOLIC) 5 MG tablet  . OVER THE COUNTER MEDICATION  . pantoprazole (PROTONIX) 20 MG tablet  . pravastatin (PRAVACHOL) 40 MG tablet   No current  facility-administered medications for this encounter.     Maia Plan [facility] Pre-Surgical Testing [phone] 11/09/19  2:09 PM

## 2019-11-10 ENCOUNTER — Encounter (HOSPITAL_COMMUNITY): Admission: RE | Payer: Self-pay | Source: Home / Self Care

## 2019-11-10 ENCOUNTER — Inpatient Hospital Stay (HOSPITAL_COMMUNITY): Admission: RE | Admit: 2019-11-10 | Payer: BC Managed Care – PPO | Source: Home / Self Care | Admitting: Urology

## 2019-11-10 SURGERY — NEPHRECTOMY, PARTIAL, ROBOT-ASSISTED
Anesthesia: General | Laterality: Left

## 2019-11-15 ENCOUNTER — Other Ambulatory Visit: Payer: Self-pay | Admitting: Urology

## 2019-11-17 NOTE — Progress Notes (Signed)
Cardiology Office Note    Date:  11/22/2019   ID:  Jenna Chavez, DOB 09-20-1966, MRN [id number]  PCP:  Redmond School, MD  Cardiologist: Rozann Lesches, MD EPS: None  Chief Complaint  Patient presents with  . Follow-up    History of Present Illness:  Jenna Chavez is a 54 y.o. female with family history of early CAD mother died in her 84s of MI Father had heart disease and sister has a defibrillator after an episode of myocarditis.  Patient has hyperlipidemia and impaired glucose tolerance.  Patient saw Dr. Domenic Polite 08/13/2018 with atypical chest pain and normal EKG.  Myoview was ordered but never done.  Echo in 2018 normal LVEF 55 to 60%.  Holter monitor 2018 normal sinus rhythm with occasional PACs and PVCs  Patient comes in for presurgical clearance for robotic assisted partial nephrectomy by Dr. Alexis Frock 12/10/2019.  Patient says she was sick the day of the stress test so it never was rescheduled. She now complains of palpitations 4 times a week. Hears it in her ears. Says it's rapid but not skipping. No associated dizziness, chest pain or pressure.Says it could be stress. Occurs at rest when she's thinking of things.Bystolic increased by Dr. Brenton Grills  And palpitations have lessoned some. Having a breast biopsy in the am. No regular exercise. 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 Also having headaches with associated neck pain.  Past Medical History:  Diagnosis Date  . Anemia    2008  . Anginal pain ([place])   . Constipation   . Gastric ulcer 1999   EGD per Dr. Gala Romney, negative for malignancy  . GERD (gastroesophageal reflux disease)   . Glucose intolerance (impaired glucose tolerance)   . History of kidney stones   . Hypertension   . Palpitations 2019  . Renal cyst, left     Past Surgical History:  Procedure Laterality Date  . CHOLECYSTECTOMY    . COLONOSCOPY  06/21/2011   RMR: normal rectum, colon, TI, repeat Aug 2017  .  COLONOSCOPY N/A 04/10/2017   Procedure: COLONOSCOPY;  Surgeon: Daneil Dolin, MD;  [facility];  Service: Endoscopy;  Laterality: N/A;  1:45pm  . ESOPHAGOGASTRODUODENOSCOPY  06/21/2011   Planned but not done at time of TCS, no need  . OOPHORECTOMY     and fallopian tube on left side  . PARTIAL HYSTERECTOMY    . Right toe surgery    . WISDOM TOOTH EXTRACTION      Current Medications: Current Meds  Medication Sig  . BYSTOLIC 10 MG tablet Take 10 mg by mouth daily.   . nebivolol (BYSTOLIC) 5 MG tablet Take 5 mg by mouth daily.  . pantoprazole (PROTONIX) 20 MG tablet Take 1 tablet (20 mg total) by mouth daily.  . pravastatin (PRAVACHOL) 40 MG tablet Take 40 mg by mouth every other day.      Allergies:   Hydrocodone and Dilaudid [hydromorphone hcl]   Social History   Socioeconomic History  . Marital status: Married    Spouse name: Not on file  . Number of children: Not on file  . Years of education: Not on file  . Highest education level: Not on file  Occupational History  . Not on file  Tobacco Use  . Smoking status: Never Smoker  . Smokeless tobacco: Never Used  Substance and Sexual Activity  . Alcohol use: No  . Drug use: No  . Sexual activity: Not on  file  Other Topics Concern  . Not on file  Social History Narrative  . Not on file   Social Determinants of Health   Financial Resource Strain:   . Difficulty of Paying Living Expenses: Not on file  Food Insecurity:   . Worried About Charity fundraiser in the Last Year: Not on file  . Ran Out of Food in the Last Year: Not on file  Transportation Needs:   . Lack of Transportation (Medical): Not on file  . Lack of Transportation (Non-Medical): Not on file  Physical Activity:   . Days of Exercise per Week: Not on file  . Minutes of Exercise per Session: Not on file  Stress:   . Feeling of Stress : Not on file  Social Connections:   . Frequency of Communication with Friends and Family: Not on file  .  Frequency of Social Gatherings with Friends and Family: Not on file  . Attends Religious Services: Not on file  . Active Member of Clubs or Organizations: Not on file  . Attends Archivist Meetings: Not on file  . Marital Status: Not on file     Family History:  The patient's family history includes Colon polyps in her father.   ROS:   Please see the history of present illness.    ROS All other systems reviewed and are negative.   PHYSICAL EXAM:   VS:  BP (!) 151/83   Pulse 79   Temp (!) 97.5 F (36.4 C)   Ht 5' 6.5" (1.689 m)   Wt 208 lb (94.3 kg)   SpO2 100%   BMI 33.07 kg/m   Physical Exam  GEN: Well nourished, well developed, in no acute distress  Neck: no JVD, carotid bruits, or masses Cardiac:RRR; no murmurs, rubs, or gallops  Respiratory:  clear to auscultation bilaterally, normal work of breathing GI: soft, nontender, nondistended, + BS Ext: without cyanosis, clubbing, or edema, Good distal pulses bilaterally Neuro:  Alert and Oriented x 3 Psych: euthymic mood, full affect  Wt Readings from Last 3 Encounters:  11/22/19 208 lb (94.3 kg)  11/09/19 203 lb 6.4 oz (92.3 kg)  11/09/19 205 lb (93 kg)      Studies/Labs Reviewed:   EKG:  EKG is not ordered today.  The ekg reviewed from 11/09/19 NSR with nonspecific ST changes no acute change Recent Labs: 12/12/2018: ALT 17 11/09/2019: BUN 16; Creatinine, Ser 0.97; Hemoglobin 14.1; Platelets 339; Potassium 3.7; Sodium 140   Lipid Panel No results found for: CHOL, TRIG, HDL, CHOLHDL, VLDL, LDLCALC, LDLDIRECT  Additional studies/ records that were reviewed today include:   Echo 06/2017 Study Conclusions   - Left ventricle: The cavity size was normal. Wall thickness was   increased in a pattern of mild to moderate LVH. Systolic function   was normal. The estimated ejection fraction was in the range of   55% to 60%. Wall motion was normal; there were no regional wall   motion abnormalities. Indeterminate  grade diastolic dysfunction.   Holter 06/2017 Study Highlights   Sinus rhythm with occasional PAC's and PVC's.  Symptoms correlated with sinus rhythm.        ASSESSMENT:    1. Preoperative clearance   2. Hyperlipidemia, unspecified hyperlipidemia type   3. Family history of early CAD   21. Palpitations      PLAN:  In order of problems listed above: Preoperative clearance before undergoing partial nephrectomy by Dr. Tresa Moore 12/10/2019.  Patient has family history  of premature CAD and has had some atypical chest pain in the past.  Stress test ordered and 2019 and never done.  Prior echo with normal LV function and Holter monitor normal sinus rhythm with some PACs and PVCs both done in 2018. Patient currently denies any chest pain. Having palpitations-mostly faster pounding in her ear that has improved some with increase in bystolic. Will order 2 week monitor and echo.  Cardiac risk index is low and METS above 4. She can proceed with above surgery without further stress testing. With her family history of CAD would recommend she have Coronary CTA. In the future. Will have echo back prior to surgery. According to the Revised Cardiac Risk Index (RCRI), her Perioperative Risk of Major Cardiac Event is (%): 0.4  Her Functional Capacity in METs is: 6.61 according to the Duke Activity Status Index (DASI).    Hyperlipidemia on Pravachol managed by PCP  Family history of early CAD would recommend she have a coronary CTA in the future.  Patient would like to have this done at [facility] because of her insurance.  She is looking into this.  No current cardiac chest pain  Palpitations patient having pounding heart rate approximately 4 times weekly without associated dizziness chest pain or shortness of breath.  Occurs at rest.  She thinks it may be due to stress.  Will place 2-week monitor.  This should not prevent her from proceeding with surgery.  We will also check 2D echo.  Follow-up with me after  tests are complete.     Medication Adjustments/Labs and Tests Ordered: Current medicines are reviewed at length with the patient today.  Concerns regarding medicines are outlined above.  Medication changes, Labs and Tests ordered today are listed in the Patient Instructions below. Patient Instructions  Medication Instructions:  Your physician recommends that you continue on your current medications as directed. Please refer to the Current Medication list given to you today.  *If you need a refill on your cardiac medications before your next appointment, please call your pharmacy*  Lab Work: NONE If you have labs (blood work) drawn today and your tests are completely normal, you will receive your results only by: Marland Kitchen MyChart Message (if you have MyChart) OR . A paper copy in the mail If you have any lab test that is abnormal or we need to change your treatment, we will call you to review the results.  Testing/Procedures: Your physician has requested that you have an echocardiogram. Echocardiography is a painless test that uses sound waves to create images of your heart. It provides your doctor with information about the size and shape of your heart and how well your heart's chambers and valves are working. This procedure takes approximately one hour. There are no restrictions for this procedure.  Your physician has recommended that you wear an event monitor for 2 weeks  . Event monitors are medical devices that record the heart's electrical activity. Doctors most often [place] these monitors to diagnose arrhythmias. Arrhythmias are problems with the speed or rhythm of the heartbeat. The monitor is a small, portable device. You can wear one while you do your normal daily activities. This is usually used to diagnose what is causing palpitations/syncope (passing out).    Follow-Up: At [facility], you and your health needs are our priority.  As part of our continuing mission to provide you with  exceptional heart care, we have created designated Provider Care Teams.  These Care Teams include your primary Cardiologist (  physician) and Advanced Practice Providers (APPs -  Physician Assistants and Nurse Practitioners) who all work together to provide you with the care you need, when you need it.  Your next appointment:   8 week(s)  The format for your next appointment:   In Person  Provider:   Ermalinda Barrios, PA-C  Other Instructions NONE      Signed, Ermalinda Barrios, PA-C  11/22/2019 2:13 PM    [address], [place], [place]  [address] Phone: [phone]; Fax: [phone]

## 2019-11-18 ENCOUNTER — Other Ambulatory Visit: Payer: Self-pay | Admitting: Urology

## 2019-11-22 ENCOUNTER — Other Ambulatory Visit: Payer: Self-pay

## 2019-11-22 ENCOUNTER — Telehealth: Payer: Self-pay | Admitting: Physician Assistant

## 2019-11-22 ENCOUNTER — Encounter: Payer: Self-pay | Admitting: Physician Assistant

## 2019-11-22 ENCOUNTER — Ambulatory Visit: Payer: BC Managed Care – PPO | Admitting: Physician Assistant

## 2019-11-22 VITALS — BP 151/83 | HR 79 | Temp 97.5°F | Ht 66.5 in | Wt 208.0 lb

## 2019-11-22 DIAGNOSIS — Z8249 Family history of ischemic heart disease and other diseases of the circulatory system: Secondary | ICD-10-CM

## 2019-11-22 DIAGNOSIS — Z01818 Encounter for other preprocedural examination: Secondary | ICD-10-CM | POA: Diagnosis not present

## 2019-11-22 DIAGNOSIS — R002 Palpitations: Secondary | ICD-10-CM

## 2019-11-22 DIAGNOSIS — E785 Hyperlipidemia, unspecified: Secondary | ICD-10-CM

## 2019-11-22 NOTE — Telephone Encounter (Signed)
Legrand Como w/ [facility] left a voicemail wanting to confirm info on a CT scan order  Please call [phone] or fax the order w/ procedure that needs to be done, dx and provider signature  To [phone]

## 2019-11-22 NOTE — Patient Instructions (Signed)
Medication Instructions:  Your physician recommends that you continue on your current medications as directed. Please refer to the Current Medication list given to you today.  *If you need a refill on your cardiac medications before your next appointment, please call your pharmacy*  Lab Work: NONE If you have labs (blood work) drawn today and your tests are completely normal, you will receive your results only by: Marland Kitchen MyChart Message (if you have MyChart) OR . A paper copy in the mail If you have any lab test that is abnormal or we need to change your treatment, we will call you to review the results.  Testing/Procedures: Your physician has requested that you have an echocardiogram. Echocardiography is a painless test that uses sound waves to create images of your heart. It provides your doctor with information about the size and shape of your heart and how well your heart's chambers and valves are working. This procedure takes approximately one hour. There are no restrictions for this procedure.  Your physician has recommended that you wear an event monitor for 2 weeks  . Event monitors are medical devices that record the heart's electrical activity. Doctors most often [place] these monitors to diagnose arrhythmias. Arrhythmias are problems with the speed or rhythm of the heartbeat. The monitor is a small, portable device. You can wear one while you do your normal daily activities. This is usually used to diagnose what is causing palpitations/syncope (passing out).    Follow-Up: At [facility], you and your health needs are our priority.  As part of our continuing mission to provide you with exceptional heart care, we have created designated Provider Care Teams.  These Care Teams include your primary Cardiologist (physician) and Advanced Practice Providers (APPs -  Physician Assistants and Nurse Practitioners) who all work together to provide you with the care you need, when you need it.  Your  next appointment:   8 week(s)  The format for your next appointment:   In Person  Provider:   Ermalinda Barrios, PA-C  Other Instructions NONE

## 2019-11-23 ENCOUNTER — Ambulatory Visit (HOSPITAL_COMMUNITY)
Admission: RE | Admit: 2019-11-23 | Discharge: 2019-11-23 | Disposition: A | Discharge status: Home / Self Care | Payer: BC Managed Care – PPO | Source: Ambulatory Visit | Attending: Obstetrics and Gynecology | Admitting: Obstetrics and Gynecology

## 2019-11-23 ENCOUNTER — Other Ambulatory Visit (HOSPITAL_COMMUNITY): Payer: Self-pay | Admitting: Internal Medicine

## 2019-11-23 ENCOUNTER — Ambulatory Visit (HOSPITAL_COMMUNITY)
Admission: RE | Admit: 2019-11-23 | Discharge: 2019-11-23 | Disposition: A | Discharge status: Home / Self Care | Payer: BC Managed Care – PPO | Source: Ambulatory Visit | Attending: Internal Medicine | Admitting: Internal Medicine

## 2019-11-23 DIAGNOSIS — R928 Other abnormal and inconclusive findings on diagnostic imaging of breast: Secondary | ICD-10-CM | POA: Insufficient documentation

## 2019-11-23 DIAGNOSIS — N6310 Unspecified lump in the right breast, unspecified quadrant: Secondary | ICD-10-CM | POA: Diagnosis present

## 2019-11-23 MED ORDER — LIDOCAINE-EPINEPHRINE (PF) 1 %-1:200000 IJ SOLN
INTRAMUSCULAR | Status: AC
  Filled 2019-11-23: qty 30

## 2019-11-23 NOTE — Telephone Encounter (Signed)
I faxed order to requesting party

## 2019-11-24 LAB — SURGICAL PATHOLOGY

## 2019-11-26 ENCOUNTER — Ambulatory Visit (HOSPITAL_COMMUNITY)
Admission: RE | Admit: 2019-11-26 | Discharge: 2019-11-26 | Disposition: A | Discharge status: Home / Self Care | Payer: BC Managed Care – PPO | Source: Ambulatory Visit | Attending: Cardiology | Admitting: Cardiology

## 2019-11-26 ENCOUNTER — Other Ambulatory Visit: Payer: Self-pay

## 2019-11-26 DIAGNOSIS — R002 Palpitations: Secondary | ICD-10-CM

## 2019-11-26 NOTE — Progress Notes (Signed)
*  PRELIMINARY RESULTS* Echocardiogram 2D Echocardiogram has been performed.  Jenna Chavez 11/26/2019, 11:35 AM

## 2019-11-29 ENCOUNTER — Telehealth: Payer: Self-pay | Admitting: Cardiology

## 2019-11-29 NOTE — Telephone Encounter (Signed)
Returning call.

## 2019-11-29 NOTE — Telephone Encounter (Signed)
Echo results given

## 2019-11-30 ENCOUNTER — Ambulatory Visit: Payer: BC Managed Care – PPO | Admitting: Cardiology

## 2019-12-07 ENCOUNTER — Encounter (HOSPITAL_COMMUNITY)
Admission: RE | Admit: 2019-12-07 | Discharge: 2019-12-07 | Disposition: A | Discharge status: Home / Self Care | Payer: BC Managed Care – PPO | Source: Ambulatory Visit | Attending: Urology | Admitting: Urology

## 2019-12-07 ENCOUNTER — Other Ambulatory Visit (HOSPITAL_COMMUNITY)
Admission: RE | Admit: 2019-12-07 | Discharge: 2019-12-07 | Disposition: A | Discharge status: Home / Self Care | Payer: BC Managed Care – PPO | Source: Ambulatory Visit | Attending: Urology | Admitting: Urology

## 2019-12-07 ENCOUNTER — Other Ambulatory Visit: Payer: Self-pay

## 2019-12-07 DIAGNOSIS — Z01812 Encounter for preprocedural laboratory examination: Secondary | ICD-10-CM | POA: Insufficient documentation

## 2019-12-07 DIAGNOSIS — Z8249 Family history of ischemic heart disease and other diseases of the circulatory system: Secondary | ICD-10-CM | POA: Diagnosis not present

## 2019-12-07 DIAGNOSIS — Z79899 Other long term (current) drug therapy: Secondary | ICD-10-CM | POA: Insufficient documentation

## 2019-12-07 DIAGNOSIS — R072 Precordial pain: Secondary | ICD-10-CM | POA: Diagnosis not present

## 2019-12-07 DIAGNOSIS — I10 Essential (primary) hypertension: Secondary | ICD-10-CM | POA: Diagnosis not present

## 2019-12-07 DIAGNOSIS — Z20822 Contact with and (suspected) exposure to covid-19: Secondary | ICD-10-CM | POA: Diagnosis not present

## 2019-12-07 DIAGNOSIS — N2889 Other specified disorders of kidney and ureter: Secondary | ICD-10-CM | POA: Diagnosis not present

## 2019-12-07 DIAGNOSIS — D649 Anemia, unspecified: Secondary | ICD-10-CM | POA: Insufficient documentation

## 2019-12-07 DIAGNOSIS — I2510 Atherosclerotic heart disease of native coronary artery without angina pectoris: Secondary | ICD-10-CM | POA: Insufficient documentation

## 2019-12-07 DIAGNOSIS — E785 Hyperlipidemia, unspecified: Secondary | ICD-10-CM | POA: Insufficient documentation

## 2019-12-07 DIAGNOSIS — K219 Gastro-esophageal reflux disease without esophagitis: Secondary | ICD-10-CM | POA: Insufficient documentation

## 2019-12-07 LAB — ABO/RH: ABO/RH(D): A POS

## 2019-12-07 LAB — CBC
HCT: 43.2 % (ref 36.0–46.0)
Hemoglobin: 13.8 g/dL (ref 12.0–15.0)
MCH: 29.1 pg (ref 26.0–34.0)
MCHC: 31.9 g/dL (ref 30.0–36.0)
MCV: 91.1 fL (ref 80.0–100.0)
Platelets: 328 10*3/uL (ref 150–400)
RBC: 4.74 MIL/uL (ref 3.87–5.11)
RDW: 14.4 % (ref 11.5–15.5)
WBC: 7.2 10*3/uL (ref 4.0–10.5)
nRBC: 0 % (ref 0.0–0.2)

## 2019-12-07 LAB — BASIC METABOLIC PANEL
Anion gap: 9 (ref 5–15)
BUN: 19 mg/dL (ref 6–20)
CO2: 24 mmol/L (ref 22–32)
Calcium: 9.2 mg/dL (ref 8.9–10.3)
Chloride: 109 mmol/L (ref 98–111)
Creatinine, Ser: 1.02 mg/dL — ABNORMAL HIGH (ref 0.44–1.00)
GFR calc Af Amer: >60 mL/min (ref >60)
GFR calc non Af Amer: >60 mL/min (ref >60)
Glucose, Bld: 114 mg/dL — ABNORMAL HIGH (ref 70–99)
Potassium: 4.2 mmol/L (ref 3.5–5.1)
Sodium: 142 mmol/L (ref 135–145)

## 2019-12-07 LAB — SARS CORONAVIRUS 2 (TAT 6-24 HRS): SARS Coronavirus 2: NEGATIVE

## 2019-12-07 NOTE — Progress Notes (Signed)
Anesthesia Chart Review   Case: [id number] Date/Time: 12/10/19 0700   Procedure: XI ROBOTIC ASSITED PARTIAL NEPHRECTOMY (Left ) - 3 HRS   Anesthesia type: General   Pre-op diagnosis: LEFT RENAL MASS   Location: [facility] ROOM 03 / [facility]   Surgeons: Alexis Frock, MD      DISCUSSION:54 y.o. never smoker with h/o GERD, HTN, left cystic renal mass scheduled for above procedure 12/10/19 with Dr. Alexis Frock.    Per previous anesthesia chart review note 11/09/19, "Pt last seen by cardiologist, Dr. Rozann Lesches, 07/2018.  Per OV note, "Precordial pain, largely atypical features but with family history of premature CAD and personal history of impaired glucose tolerance as well as hyperlipidemia. Recent ECG normal. Echocardiogram from last year showed normal LVEF at 55 to 60%. She has not undergone formal ischemic testing and we will arrange an exercise Myoview, hold Bystolic on the morning of the study."  Pt did not follow up with cardiologist.  Discussed with Dr. Deatra Canter.  Pt needs clearance from cardiologist prior to proceeding. VM left with Dr. Zettie Pho scheduler."  Since that time pt was seen by cardiology 11/22/2019.  Per OV note, "Preoperative clearance before undergoing partial nephrectomy by Dr. Tresa Moore 12/10/2019.  Patient has family history of premature CAD and has had some atypical chest pain in the past.  Stress test ordered and 2019 and never done.  Prior echo with normal LV function and Holter monitor normal sinus rhythm with some PACs and PVCs both done in 2018. Patient currently denies any chest pain. Having palpitations-mostly faster pounding in her ear that has improved some with increase in bystolic. Will order 2 week monitor and echo.  Cardiac risk index is low and METS above 4. She can proceed with above surgery without further stress testing. With her family history of CAD would recommend she have Coronary CTA. In the future. Will have echo back prior to surgery. According to the  Revised Cardiac Risk Index (RCRI), her Perioperative Risk of Major Cardiac Event is (%): 0.4 Her Functional Capacity in METs is: 6.61 according to the Duke Activity Status Index (DASI)."  Per 11/26/19 Echo results, "Echo with normal heart function and mild thickening.  No significant valvular abnormalities.  Can proceed with surgery by Dr. Tresa Moore."  Anticipate pt can proceed with planned procedure barring acute status change.   VS: BP (!) 167/89 (BP Location: Right Arm) Comment: Notified RN about patient B/P.  Pulse 72   Temp 37 C (Oral)   Resp 18   Ht 5\' 6"  (1.676 m)   Wt 95.9 kg   SpO2 100%   BMI 34.12 kg/m   PROVIDERS: [employer], MD  Rozann Lesches, MD is Cardiologist  LABS: Labs reviewed: Acceptable for surgery. (all labs ordered are listed, but only abnormal results are displayed)  Labs Reviewed  BASIC METABOLIC PANEL - Abnormal; Notable for the following components:      Result Value   Glucose, Bld 114 (*)    Creatinine, Ser 1.02 (*)    All other components within normal limits  CBC  TYPE AND SCREEN  ABO/RH     IMAGES:   EKG: 11/09/19 Rate 75 bpm Normal sinus rhythm  Nonspecific T wave abnormality  CV: Echo 11/26/2019 IMPRESSIONS  1. Left ventricular ejection fraction, by visual estimation, is 60 to  65%. The left ventricle has normal function. There is mildly increased  left ventricular hypertrophy.  2. The left ventricle has no regional wall motion abnormalities.  3. Global  right ventricle has normal systolic function.The right  ventricular size is normal. No increase in right ventricular wall  thickness.  4. Left atrial size was normal.  5. Right atrial size was normal.  6. The mitral valve is normal in structure. Trivial mitral valve  regurgitation.  7. The tricuspid valve is normal in structure.  8. The tricuspid valve is normal in structure. Tricuspid valve  regurgitation is not demonstrated.  9. The aortic valve is tricuspid.  Aortic valve regurgitation is not  visualized. No evidence of aortic valve sclerosis or stenosis.  10. The pulmonic valve was normal in structure. Pulmonic valve  regurgitation is trivial.  11. The inferior vena cava is normal in size with greater than 50%  respiratory variability, suggesting right atrial pressure of 3 mmHg.  Past Medical History:  Diagnosis Date  . Anemia    2008  . Anginal pain ([place])   . Constipation   . Gastric ulcer 1999   EGD per Dr. Gala Romney, negative for malignancy  . GERD (gastroesophageal reflux disease)   . Glucose intolerance (impaired glucose tolerance)   . History of kidney stones   . Hypertension   . Palpitations 2019  . Renal cyst, left     Past Surgical History:  Procedure Laterality Date  . CHOLECYSTECTOMY    . COLONOSCOPY  06/21/2011   RMR: normal rectum, colon, TI, repeat Aug 2017  . COLONOSCOPY N/A 04/10/2017   Procedure: COLONOSCOPY;  Surgeon: Daneil Dolin, MD;  Location[facility];  Service: Endoscopy;  Laterality: N/A;  1:45pm  . ESOPHAGOGASTRODUODENOSCOPY  06/21/2011   Planned but not done at time of TCS, no need  . OOPHORECTOMY     and fallopian tube on left side  . PARTIAL HYSTERECTOMY    . Right toe surgery    . WISDOM TOOTH EXTRACTION      MEDICATIONS: . BYSTOLIC 10 MG tablet  . nebivolol (BYSTOLIC) 5 MG tablet  . pantoprazole (PROTONIX) 20 MG tablet  . pravastatin (PRAVACHOL) 40 MG tablet   No current facility-administered medications for this encounter.    [facility] Pre-Surgical Testing [phone] 12/07/19  4:23 PM

## 2019-12-07 NOTE — Patient Instructions (Addendum)
DUE TO COVID-19 ONLY ONE VISITOR IS ALLOWED TO COME WITH YOU AND STAY IN THE WAITING ROOM ONLY DURING PRE OP AND PROCEDURE DAY OF SURGERY. THE 1 VISITOR MAY VISIT WITH YOU AFTER SURGERY IN YOUR PRIVATE ROOM DURING VISITING HOURS ONLY!  YOU NEED TO HAVE A COVID 19 TEST ON 12-07-19 @ 8:35 AM, THIS TEST MUST BE DONE BEFORE SURGERY, COME  [address], [address] [place] , [id number].  ([place]) ONCE YOUR COVID TEST IS COMPLETED, PLEASE BEGIN THE QUARANTINE INSTRUCTIONS AS OUTLINED IN YOUR HANDOUT.                [place]  12/07/2019   Your procedure is scheduled on: 12-10-19   Report to [facility] Main  Entrance    Report to [facility]  at 5:30 AM     Call this number if you have problems the morning of surgery [phone]    Remember: Please consume a Clear Liquid Diet the Day before surgery. Do not eat food or drink liquids :After Midnight.     Take these medicines the morning of surgery with A SIP OF WATER: Bystolic and Protonix    BRUSH YOUR TEETH MORNING OF SURGERY AND RINSE YOUR MOUTH OUT, NO CHEWING GUM CANDY OR MINTS.                                 You may not have any metal on your body including hair pins and              piercings     Do not wear jewelry, make-up, lotions, powders or perfumes, deodorant   CLEAR LIQUID DIET   Foods Allowed                                                                     Foods Excluded  Coffee and tea, regular and decaf                             liquids that you cannot  Plain Jell-O any favor except red or purple                                           see through such as: Fruit ices (not with fruit pulp)                                     milk, soups, orange juice  Iced Popsicles                                    All solid food Carbonated beverages, regular and diet                                    Cranberry, grape and apple juices Sports  drinks like Gatorade Lightly seasoned clear broth or  consume(fat free) Sugar, honey syrup  Sample Menu Breakfast                                Lunch                                     Supper Cranberry juice                    Beef broth                            Chicken broth Jell-O                                     Grape juice                           Apple juice Coffee or tea                        Jell-O                                      Popsicle                                                Coffee or tea                        Coffee or tea  _____________________________________________________________________               Do not wear nail polish on your fingernails.  Do not shave  48 hours prior to surgery.     Do not bring valuables to the hospital. [place].  Contacts, dentures or bridgework may not be worn into surgery.   You may bring and overnight bag       Special Instructions: Please follow your bowel prep, per your surgeon's instructions              Please read over the following fact sheets you were given: _____________________________________________________________________             [facility] - Preparing for Surgery Before surgery, you can play an important role.  Because skin is not sterile, your skin needs to be as free of germs as possible.  You can reduce the number of germs on your skin by washing with CHG (chlorahexidine gluconate) soap before surgery.  CHG is an antiseptic cleaner which kills germs and bonds with the skin to continue killing germs even after washing. Please DO NOT use if you have an allergy to CHG or antibacterial soaps.  If your skin becomes reddened/irritated stop using the CHG and inform your nurse when you arrive at Short Stay. Do not shave (including legs and underarms) for at  least 48 hours prior to the first CHG shower.  You may shave your face/neck. Please follow these instructions carefully:  1.  Shower with CHG Soap  the night before surgery and the  morning of Surgery.  2.  If you choose to wash your hair, wash your hair first as usual with your  normal  shampoo.  3.  After you shampoo, rinse your hair and body thoroughly to remove the  shampoo.                           4.  Use CHG as you would any other liquid soap.  You can apply chg directly  to the skin and wash                       Gently with a scrungie or clean washcloth.  5.  Apply the CHG Soap to your body ONLY FROM THE NECK DOWN.   Do not use on face/ open                           Wound or open sores. Avoid contact with eyes, ears mouth and genitals (private parts).                       Wash face,  Genitals (private parts) with your normal soap.             6.  Wash thoroughly, paying special attention to the area where your surgery  will be performed.  7.  Thoroughly rinse your body with warm water from the neck down.  8.  DO NOT shower/wash with your normal soap after using and rinsing off  the CHG Soap.                9.  Pat yourself dry with a clean towel.            10.  Wear clean pajamas.            11.  Place clean sheets on your bed the night of your first shower and do not  sleep with pets. Day of Surgery : Do not apply any lotions/deodorants the morning of surgery.  Please wear clean clothes to the hospital/surgery center.  FAILURE TO FOLLOW THESE INSTRUCTIONS MAY RESULT IN THE CANCELLATION OF YOUR SURGERY PATIENT SIGNATURE_________________________________  NURSE SIGNATURE__________________________________  ________________________________________________________________________

## 2019-12-07 NOTE — Progress Notes (Signed)
PCP - [employer]  Cardiologist - Rozann Lesches w/cardiac clearance dated 11-22-19 from [facility]  Chest x-ray -  EKG - 11-09-19 Stress Test -  ECHO - 11-26-19 Cardiac Cath -   Sleep Study -  CPAP -   Fasting Blood Sugar -  Checks Blood Sugar _____ times a day  Blood Thinner Instructions: Aspirin Instructions: Last Dose:  Anesthesia review:   Patient denies shortness of breath, fever, cough and chest pain at PAT appointment   Patient verbalized understanding of instructions that were given to them at the PAT appointment. Patient was also instructed that they will need to review over the PAT instructions again at home before surgery.

## 2019-12-09 NOTE — Anesthesia Preprocedure Evaluation (Addendum)
Anesthesia Evaluation  Patient identified by MRN, date of birth, ID band Patient awake    Reviewed: Allergy & Precautions, NPO status , Patient's Chart, lab work & pertinent test results, reviewed documented beta blocker date and time   Airway Mallampati: I  TM Distance: >3 FB Neck ROM: Full    Dental no notable dental hx.    Pulmonary neg pulmonary ROS,    Pulmonary exam normal breath sounds clear to auscultation       Cardiovascular hypertension, Pt. on home beta blockers Normal cardiovascular exam Rhythm:Regular Rate:Normal  ECG: NSR, rate 75  ECHO: 1. Left ventricular ejection fraction, by visual estimation, is 60 to 65%. The left ventricle has normal function. There is mildly increased left ventricular hypertrophy. 2. The left ventricle has no regional wall motion abnormalities. 3. Global right ventricle has normal systolic function.The right ventricular size is normal. No increase in right ventricular wall thickness. 4. Left atrial size was normal. 5. Right atrial size was normal. 6. The mitral valve is normal in structure. Trivial mitral valve regurgitation. 7. The tricuspid valve is normal in structure. 8. The tricuspid valve is normal in structure. Tricuspid valve regurgitation is not demonstrated. 9. The aortic valve is tricuspid. Aortic valve regurgitation is not visualized. No evidence of aortic valve sclerosis or stenosis. 10. The pulmonic valve was normal in structure. Pulmonic valve regurgitation is trivial. 11. The inferior vena cava is normal in size with  Sees cardiologist Domenic Polite) in [place]   Neuro/Psych negative neurological ROS  negative psych ROS   GI/Hepatic Neg liver ROS, PUD,   Endo/Other  negative endocrine ROS  Renal/GU negative Renal ROS     Musculoskeletal negative musculoskeletal ROS (+)   Abdominal (+) + obese,   Peds  Hematology HLD   Anesthesia Other Findings LEFT RENAL  MASS  Reproductive/Obstetrics                            Anesthesia Physical Anesthesia Plan  ASA: II  Anesthesia Plan: General   Post-op Pain Management:    Induction: Intravenous  PONV Risk Score and Plan: 4 or greater and Ondansetron, Dexamethasone, Midazolam, Scopolamine patch - Pre-op and Treatment may vary due to age or medical condition  Airway Management Planned: Oral ETT  Additional Equipment: Arterial line  Intra-op Plan:   Post-operative Plan: Extubation in OR  Informed Consent: I have reviewed the patients History and Physical, chart, labs and discussed the procedure including the risks, benefits and alternatives for the proposed anesthesia with the patient or authorized representative who has indicated his/her understanding and acceptance.     Dental advisory given  Plan Discussed with: CRNA  Anesthesia Plan Comments:        Anesthesia Quick Evaluation

## 2019-12-10 ENCOUNTER — Ambulatory Visit (HOSPITAL_COMMUNITY): Payer: BC Managed Care – PPO | Admitting: Anesthesiology

## 2019-12-10 ENCOUNTER — Observation Stay (HOSPITAL_COMMUNITY)
Admission: RE | Admit: 2019-12-10 | Discharge: 2019-12-11 | Disposition: A | Discharge status: Home / Self Care | Payer: BC Managed Care – PPO | Attending: Urology | Admitting: Urology

## 2019-12-10 ENCOUNTER — Other Ambulatory Visit: Payer: Self-pay

## 2019-12-10 ENCOUNTER — Encounter (HOSPITAL_COMMUNITY)
Admission: RE | Disposition: A | Discharge status: Home / Self Care | Payer: Self-pay | Source: Home / Self Care | Attending: Urology

## 2019-12-10 ENCOUNTER — Encounter (HOSPITAL_COMMUNITY): Payer: Self-pay | Admitting: Urology

## 2019-12-10 ENCOUNTER — Ambulatory Visit (HOSPITAL_COMMUNITY): Payer: BC Managed Care – PPO | Admitting: Physician Assistant

## 2019-12-10 DIAGNOSIS — I10 Essential (primary) hypertension: Secondary | ICD-10-CM | POA: Diagnosis not present

## 2019-12-10 DIAGNOSIS — Z6834 Body mass index (BMI) 34.0-34.9, adult: Secondary | ICD-10-CM | POA: Insufficient documentation

## 2019-12-10 DIAGNOSIS — Z885 Allergy status to narcotic agent status: Secondary | ICD-10-CM | POA: Insufficient documentation

## 2019-12-10 DIAGNOSIS — D1771 Benign lipomatous neoplasm of kidney: Secondary | ICD-10-CM | POA: Diagnosis not present

## 2019-12-10 DIAGNOSIS — Z87442 Personal history of urinary calculi: Secondary | ICD-10-CM | POA: Insufficient documentation

## 2019-12-10 DIAGNOSIS — K279 Peptic ulcer, site unspecified, unspecified as acute or chronic, without hemorrhage or perforation: Secondary | ICD-10-CM | POA: Insufficient documentation

## 2019-12-10 DIAGNOSIS — E669 Obesity, unspecified: Secondary | ICD-10-CM | POA: Insufficient documentation

## 2019-12-10 DIAGNOSIS — Z79899 Other long term (current) drug therapy: Secondary | ICD-10-CM | POA: Insufficient documentation

## 2019-12-10 DIAGNOSIS — L720 Epidermal cyst: Secondary | ICD-10-CM | POA: Diagnosis not present

## 2019-12-10 DIAGNOSIS — R109 Unspecified abdominal pain: Secondary | ICD-10-CM | POA: Insufficient documentation

## 2019-12-10 DIAGNOSIS — K219 Gastro-esophageal reflux disease without esophagitis: Secondary | ICD-10-CM | POA: Insufficient documentation

## 2019-12-10 DIAGNOSIS — E785 Hyperlipidemia, unspecified: Secondary | ICD-10-CM | POA: Diagnosis not present

## 2019-12-10 DIAGNOSIS — N2889 Other specified disorders of kidney and ureter: Secondary | ICD-10-CM | POA: Diagnosis present

## 2019-12-10 HISTORY — PX: ROBOTIC ASSITED PARTIAL NEPHRECTOMY: SHX6087

## 2019-12-10 LAB — HEMOGLOBIN AND HEMATOCRIT, BLOOD
HCT: 36.9 % (ref 36.0–46.0)
Hemoglobin: 11.8 g/dL — ABNORMAL LOW (ref 12.0–15.0)

## 2019-12-10 LAB — TYPE AND SCREEN
ABO/RH(D): A POS
Antibody Screen: NEGATIVE

## 2019-12-10 SURGERY — NEPHRECTOMY, PARTIAL, ROBOT-ASSISTED
Anesthesia: General | Laterality: Left

## 2019-12-10 MED ORDER — SCOPOLAMINE 1 MG/3DAYS TD PT72
1.0000 | MEDICATED_PATCH | TRANSDERMAL | Status: DC
  Administered 2019-12-10: 1.5 mg via TRANSDERMAL
  Filled 2019-12-10: qty 1

## 2019-12-10 MED ORDER — FENTANYL CITRATE (PF) 100 MCG/2ML IJ SOLN
25.0000 ug | INTRAMUSCULAR | Status: DC | PRN
  Administered 2019-12-10 (×2): 50 ug via INTRAVENOUS
  Filled 2019-12-10 (×2): qty 2

## 2019-12-10 MED ORDER — LACTATED RINGERS IR SOLN
Status: DC | PRN
  Administered 2019-12-10: 1

## 2019-12-10 MED ORDER — ONDANSETRON HCL 4 MG/2ML IJ SOLN
INTRAMUSCULAR | Status: AC
  Filled 2019-12-10: qty 2

## 2019-12-10 MED ORDER — DEXTROSE-NACL 5-0.45 % IV SOLN: INTRAVENOUS | Status: DC

## 2019-12-10 MED ORDER — ONDANSETRON HCL 4 MG/2ML IJ SOLN
INTRAMUSCULAR | Status: DC | PRN
  Administered 2019-12-10: 4 mg via INTRAVENOUS

## 2019-12-10 MED ORDER — ACETAMINOPHEN 500 MG PO TABS
1000.0000 mg | ORAL_TABLET | Freq: Once | ORAL | Status: AC
  Administered 2019-12-10: 1000 mg via ORAL
  Filled 2019-12-10: qty 2

## 2019-12-10 MED ORDER — LACTATED RINGERS IV SOLN: INTRAVENOUS | Status: DC

## 2019-12-10 MED ORDER — MIDAZOLAM HCL 2 MG/2ML IJ SOLN
INTRAMUSCULAR | Status: AC
  Filled 2019-12-10: qty 2

## 2019-12-10 MED ORDER — FENTANYL CITRATE (PF) 250 MCG/5ML IJ SOLN
INTRAMUSCULAR | Status: AC
  Filled 2019-12-10: qty 5

## 2019-12-10 MED ORDER — FENTANYL CITRATE (PF) 100 MCG/2ML IJ SOLN
25.0000 ug | INTRAMUSCULAR | Status: DC | PRN
  Administered 2019-12-10 (×3): 50 ug via INTRAVENOUS

## 2019-12-10 MED ORDER — ACETAMINOPHEN 500 MG PO TABS
1000.0000 mg | ORAL_TABLET | Freq: Four times a day (QID) | ORAL | Status: AC
  Administered 2019-12-10 – 2019-12-11 (×4): 1000 mg via ORAL
  Filled 2019-12-10 (×4): qty 2

## 2019-12-10 MED ORDER — PANTOPRAZOLE SODIUM 20 MG PO TBEC
20.0000 mg | DELAYED_RELEASE_TABLET | Freq: Every day | ORAL | Status: DC
  Administered 2019-12-11: 10:00:00 20 mg via ORAL
  Filled 2019-12-10: qty 1

## 2019-12-10 MED ORDER — LIDOCAINE 2% (20 MG/ML) 5 ML SYRINGE
INTRAMUSCULAR | Status: AC
  Filled 2019-12-10: qty 5

## 2019-12-10 MED ORDER — STERILE WATER FOR IRRIGATION IR SOLN
Status: DC | PRN
  Administered 2019-12-10: 1000 mL

## 2019-12-10 MED ORDER — DIPHENHYDRAMINE HCL 12.5 MG/5ML PO ELIX: 12.5000 mg | ORAL_SOLUTION | Freq: Four times a day (QID) | ORAL | Status: DC | PRN

## 2019-12-10 MED ORDER — DEXAMETHASONE SODIUM PHOSPHATE 10 MG/ML IJ SOLN
INTRAMUSCULAR | Status: DC | PRN
  Administered 2019-12-10: 10 mg via INTRAVENOUS

## 2019-12-10 MED ORDER — SUGAMMADEX SODIUM 200 MG/2ML IV SOLN
INTRAVENOUS | Status: DC | PRN
  Administered 2019-12-10: 400 mg via INTRAVENOUS

## 2019-12-10 MED ORDER — FENTANYL CITRATE (PF) 100 MCG/2ML IJ SOLN
INTRAMUSCULAR | Status: AC
  Filled 2019-12-10: qty 4

## 2019-12-10 MED ORDER — DIPHENHYDRAMINE HCL 50 MG/ML IJ SOLN: 12.5000 mg | Freq: Four times a day (QID) | INTRAMUSCULAR | Status: DC | PRN

## 2019-12-10 MED ORDER — BUPIVACAINE LIPOSOME 1.3 % IJ SUSP
20.0000 mL | Freq: Once | INTRAMUSCULAR | Status: AC
  Administered 2019-12-10: 20 mL
  Filled 2019-12-10: qty 20

## 2019-12-10 MED ORDER — TRAMADOL HCL 50 MG PO TABS
50.0000 mg | ORAL_TABLET | Freq: Four times a day (QID) | ORAL | Status: DC | PRN
  Administered 2019-12-10 – 2019-12-11 (×3): 50 mg via ORAL
  Filled 2019-12-10 (×3): qty 1

## 2019-12-10 MED ORDER — PROPOFOL 10 MG/ML IV BOLUS
INTRAVENOUS | Status: AC
  Filled 2019-12-10: qty 40

## 2019-12-10 MED ORDER — MAGNESIUM CITRATE PO SOLN: 1.0000 | Freq: Once | ORAL | Status: DC

## 2019-12-10 MED ORDER — PHENYLEPHRINE HCL-NACL 10-0.9 MG/250ML-% IV SOLN
INTRAVENOUS | Status: AC
  Filled 2019-12-10: qty 250

## 2019-12-10 MED ORDER — ROCURONIUM BROMIDE 10 MG/ML (PF) SYRINGE
PREFILLED_SYRINGE | INTRAVENOUS | Status: AC
  Filled 2019-12-10: qty 10

## 2019-12-10 MED ORDER — PHENYLEPHRINE 40 MCG/ML (10ML) SYRINGE FOR IV PUSH (FOR BLOOD PRESSURE SUPPORT)
PREFILLED_SYRINGE | INTRAVENOUS | Status: AC
  Filled 2019-12-10: qty 10

## 2019-12-10 MED ORDER — LIDOCAINE 2% (20 MG/ML) 5 ML SYRINGE
INTRAMUSCULAR | Status: DC | PRN
  Administered 2019-12-10: 40 mg via INTRAVENOUS

## 2019-12-10 MED ORDER — MIDAZOLAM HCL 5 MG/5ML IJ SOLN
INTRAMUSCULAR | Status: DC | PRN
  Administered 2019-12-10: 2 mg via INTRAVENOUS

## 2019-12-10 MED ORDER — SODIUM CHLORIDE (PF) 0.9 % IJ SOLN
INTRAMUSCULAR | Status: AC
  Filled 2019-12-10: qty 20

## 2019-12-10 MED ORDER — NEBIVOLOL HCL 5 MG PO TABS
15.0000 mg | ORAL_TABLET | Freq: Every day | ORAL | Status: DC
  Administered 2019-12-11: 15 mg via ORAL
  Filled 2019-12-10: qty 3

## 2019-12-10 MED ORDER — ROCURONIUM BROMIDE 10 MG/ML (PF) SYRINGE
PREFILLED_SYRINGE | INTRAVENOUS | Status: DC | PRN
  Administered 2019-12-10: 60 mg via INTRAVENOUS
  Administered 2019-12-10 (×2): 20 mg via INTRAVENOUS

## 2019-12-10 MED ORDER — PRAVASTATIN SODIUM 40 MG PO TABS
40.0000 mg | ORAL_TABLET | Freq: Every day | ORAL | Status: DC
  Administered 2019-12-10: 22:00:00 40 mg via ORAL
  Filled 2019-12-10: qty 1

## 2019-12-10 MED ORDER — NEBIVOLOL HCL 10 MG PO TABS: 10.0000 mg | ORAL_TABLET | Freq: Every day | ORAL | Status: DC

## 2019-12-10 MED ORDER — DEXAMETHASONE SODIUM PHOSPHATE 10 MG/ML IJ SOLN
INTRAMUSCULAR | Status: AC
  Filled 2019-12-10: qty 1

## 2019-12-10 MED ORDER — PROPOFOL 10 MG/ML IV BOLUS
INTRAVENOUS | Status: DC | PRN
  Administered 2019-12-10: 150 mg via INTRAVENOUS
  Administered 2019-12-10: 50 mg via INTRAVENOUS

## 2019-12-10 MED ORDER — SODIUM CHLORIDE (PF) 0.9 % IJ SOLN
INTRAMUSCULAR | Status: DC | PRN
  Administered 2019-12-10: 20 mL

## 2019-12-10 MED ORDER — FENTANYL CITRATE (PF) 250 MCG/5ML IJ SOLN
INTRAMUSCULAR | Status: DC | PRN
  Administered 2019-12-10: 100 ug via INTRAVENOUS
  Administered 2019-12-10 (×3): 50 ug via INTRAVENOUS

## 2019-12-10 MED ORDER — CEFAZOLIN SODIUM-DEXTROSE 2-4 GM/100ML-% IV SOLN
2.0000 g | INTRAVENOUS | Status: AC
  Administered 2019-12-10: 08:00:00 2 g via INTRAVENOUS
  Filled 2019-12-10: qty 100

## 2019-12-10 MED ORDER — PROMETHAZINE HCL 25 MG/ML IJ SOLN: 6.2500 mg | INTRAMUSCULAR | Status: DC | PRN

## 2019-12-10 MED ORDER — ONDANSETRON HCL 4 MG/2ML IJ SOLN: 4.0000 mg | INTRAMUSCULAR | Status: DC | PRN

## 2019-12-10 MED ORDER — DOCUSATE SODIUM 100 MG PO CAPS
100.0000 mg | ORAL_CAPSULE | Freq: Two times a day (BID) | ORAL | Status: DC
  Administered 2019-12-10 – 2019-12-11 (×2): 100 mg via ORAL
  Filled 2019-12-10 (×2): qty 1

## 2019-12-10 MED ORDER — PHENYLEPHRINE 40 MCG/ML (10ML) SYRINGE FOR IV PUSH (FOR BLOOD PRESSURE SUPPORT)
PREFILLED_SYRINGE | INTRAVENOUS | Status: DC | PRN
  Administered 2019-12-10 (×2): 40 ug via INTRAVENOUS

## 2019-12-10 SURGICAL SUPPLY — 69 items
APPLICATOR SURGIFLO ENDO (HEMOSTASIS) ×3 IMPLANT
CHLORAPREP W/TINT 26 (MISCELLANEOUS) ×3 IMPLANT
CLIP SUT LAPRA TY ABSORB (SUTURE) ×3 IMPLANT
CLIP VESOLOCK LG 6/CT PURPLE (CLIP) ×6 IMPLANT
CLIP VESOLOCK MED LG 6/CT (CLIP) ×6 IMPLANT
CLIP VESOLOCK XL 6/CT (CLIP) IMPLANT
COVER SURGICAL LIGHT HANDLE (MISCELLANEOUS) ×3 IMPLANT
COVER TIP SHEARS 8 DVNC (MISCELLANEOUS) ×1 IMPLANT
COVER TIP SHEARS 8MM DA VINCI (MISCELLANEOUS) ×2
COVER WAND RF STERILE (DRAPES) IMPLANT
DECANTER SPIKE VIAL GLASS SM (MISCELLANEOUS) ×3 IMPLANT
DERMABOND ADVANCED (GAUZE/BANDAGES/DRESSINGS) ×2
DERMABOND ADVANCED .7 DNX12 (GAUZE/BANDAGES/DRESSINGS) ×1 IMPLANT
DRAIN CHANNEL 15F RND FF 3/16 (WOUND CARE) ×3 IMPLANT
DRAPE ARM DVNC X/XI (DISPOSABLE) ×4 IMPLANT
DRAPE COLUMN DVNC XI (DISPOSABLE) ×1 IMPLANT
DRAPE DA VINCI XI ARM (DISPOSABLE) ×8
DRAPE DA VINCI XI COLUMN (DISPOSABLE) ×2
DRAPE INCISE IOBAN 66X45 STRL (DRAPES) ×3 IMPLANT
DRAPE SHEET LG 3/4 BI-LAMINATE (DRAPES) ×3 IMPLANT
ELECT PENCIL ROCKER SW 15FT (MISCELLANEOUS) ×3 IMPLANT
ELECT REM PT RETURN 15FT ADLT (MISCELLANEOUS) ×3 IMPLANT
EVACUATOR SILICONE 100CC (DRAIN) ×3 IMPLANT
GLOVE BIO SURGEON STRL SZ 6.5 (GLOVE) ×2 IMPLANT
GLOVE BIO SURGEONS STRL SZ 6.5 (GLOVE) ×1
GLOVE BIOGEL M STRL SZ7.5 (GLOVE) ×6 IMPLANT
GOWN STRL REUS W/TWL LRG LVL3 (GOWN DISPOSABLE) ×15 IMPLANT
HEMOSTAT SURGICEL 4X8 (HEMOSTASIS) ×3 IMPLANT
IRRIG SUCT STRYKERFLOW 2 WTIP (MISCELLANEOUS) ×3
IRRIGATION SUCT STRKRFLW 2 WTP (MISCELLANEOUS) ×1 IMPLANT
KIT BASIN OR (CUSTOM PROCEDURE TRAY) ×3 IMPLANT
KIT TURNOVER KIT A (KITS) IMPLANT
LOOP VESSEL MAXI BLUE (MISCELLANEOUS) ×3 IMPLANT
MARKER SKIN DUAL TIP RULER LAB (MISCELLANEOUS) ×3 IMPLANT
NEEDLE INSUFFLATION 14GA 120MM (NEEDLE) ×3 IMPLANT
NS IRRIG 1000ML POUR BTL (IV SOLUTION) ×3 IMPLANT
PENCIL SMOKE EVACUATOR (MISCELLANEOUS) IMPLANT
PORT ACCESS TROCAR AIRSEAL 12 (TROCAR) ×1 IMPLANT
PORT ACCESS TROCAR AIRSEAL 5M (TROCAR) ×2
POUCH SPECIMEN RETRIEVAL 10MM (ENDOMECHANICALS) ×3 IMPLANT
PROTECTOR NERVE ULNAR (MISCELLANEOUS) ×6 IMPLANT
RELOAD STAPLER WHITE 60MM (STAPLE) IMPLANT
SEAL CANN UNIV 5-8 DVNC XI (MISCELLANEOUS) ×4 IMPLANT
SEAL XI 5MM-8MM UNIVERSAL (MISCELLANEOUS) ×8
SET TRI-LUMEN FLTR TB AIRSEAL (TUBING) ×3 IMPLANT
SOLUTION ELECTROLUBE (MISCELLANEOUS) ×3 IMPLANT
SPONGE LAP 4X18 RFD (DISPOSABLE) ×3 IMPLANT
STAPLE ECHEON FLEX 60 POW ENDO (STAPLE) IMPLANT
STAPLER RELOAD WHITE 60MM (STAPLE)
SURGIFLO W/THROMBIN 8M KIT (HEMOSTASIS) ×3 IMPLANT
SUT ETHILON 3 0 PS 1 (SUTURE) ×3 IMPLANT
SUT MNCRL AB 4-0 PS2 18 (SUTURE) ×6 IMPLANT
SUT PDS AB 1 CT1 27 (SUTURE) ×6 IMPLANT
SUT V-LOC BARB 180 2/0GR6 GS22 (SUTURE)
SUT VIC AB 0 CT1 27 (SUTURE) ×8
SUT VIC AB 0 CT1 27XBRD ANTBC (SUTURE) ×4 IMPLANT
SUT VIC AB 2-0 SH 27 (SUTURE) ×4
SUT VIC AB 2-0 SH 27X BRD (SUTURE) ×2 IMPLANT
SUT VICRYL 0 UR6 27IN ABS (SUTURE) ×3 IMPLANT
SUT VLOC BARB 180 ABS3/0GR12 (SUTURE) ×3
SUTURE V-LC BRB 180 2/0GR6GS22 (SUTURE) IMPLANT
SUTURE VLOC BRB 180 ABS3/0GR12 (SUTURE) ×1 IMPLANT
TOWEL OR 17X26 10 PK STRL BLUE (TOWEL DISPOSABLE) ×6 IMPLANT
TOWEL OR NON WOVEN STRL DISP B (DISPOSABLE) ×3 IMPLANT
TRAY FOLEY MTR SLVR 16FR STAT (SET/KITS/TRAYS/PACK) ×3 IMPLANT
TRAY LAPAROSCOPIC (CUSTOM PROCEDURE TRAY) ×3 IMPLANT
TROCAR BLADELESS OPT 5 100 (ENDOMECHANICALS) IMPLANT
TROCAR XCEL 12X100 BLDLESS (ENDOMECHANICALS) ×3 IMPLANT
WATER STERILE IRR 1000ML POUR (IV SOLUTION) ×3 IMPLANT

## 2019-12-10 NOTE — H&P (Signed)
Jenna Chavez is an 54 y.o. female.    Chief Complaint: Pre-op LEFT robotic partial nephrectomy  HPI:   1 - Enlarging Left Mildly Complex Renal Cyst - lower pole medial cyst slowly enlarging since at least 2011. 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   2011 - 1.3cm  2014 - 2.3cm  07/2018 - 4.3cm, no hydro, few thin septations by MRI, no enhancing nodules.  03/2019 - 4.8cm, no hydro, enlarging inferior/medial soft tissue component (appears enhancing) on contrast CT   PMH sig for benign hyst, obesity, HLD, HTN, Chronic abd pain (numerous equivocal GYN, GI, even GU eval x years, overall modest bother). 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 Her PCP is Dr. Gerarda Fraction.   Today " Pam " is seen to proceed with LEFT robotic partial nephrectomy for enlarging left cystic renal mass. NO intervl fevers.  C19 screen negative. Hgb 13.8    Past Medical History:  Diagnosis Date  . Anemia    2008  . Anginal pain ([place])   . Constipation   . Gastric ulcer 1999   EGD per Dr. Gala Romney, negative for malignancy  . GERD (gastroesophageal reflux disease)   . Glucose intolerance (impaired glucose tolerance)   . History of kidney stones   . Hypertension   . Palpitations 2019  . Renal cyst, left     Past Surgical History:  Procedure Laterality Date  . CHOLECYSTECTOMY    . COLONOSCOPY  06/21/2011   RMR: normal rectum, colon, TI, repeat Aug 2017  . COLONOSCOPY N/A 04/10/2017   Procedure: COLONOSCOPY;  Surgeon: Daneil Dolin, MD;  [facility];  Service: Endoscopy;  Laterality: N/A;  1:45pm  . ESOPHAGOGASTRODUODENOSCOPY  06/21/2011   Planned but not done at time of TCS, no need  . OOPHORECTOMY     and fallopian tube on left side  . PARTIAL HYSTERECTOMY    . Right toe surgery    . WISDOM TOOTH EXTRACTION      Family History  Problem Relation Age of Onset  . Colon polyps Father        onset early age  . Colon  cancer Neg Hx    Social History:  reports that she has never smoked. She has never used smokeless tobacco. She reports that she does not drink alcohol or use drugs.  Allergies:  Allergies  Allergen Reactions  . Hydrocodone     Hallucinations   . Dilaudid [Hydromorphone Hcl] Palpitations    Medications Prior to Admission  Medication Sig Dispense Refill  . BYSTOLIC 10 MG tablet Take 10 mg by mouth daily.     . nebivolol (BYSTOLIC) 5 MG tablet Take 5 mg by mouth daily.    . pantoprazole (PROTONIX) 20 MG tablet Take 1 tablet (20 mg total) by mouth daily. 30 tablet 0  . pravastatin (PRAVACHOL) 40 MG tablet Take 40 mg by mouth daily.       No results found for this or any previous visit (from the past 48 hour(s)). No results found.  Review of Systems  Constitutional: Negative for chills and fatigue.  All other systems reviewed and are negative.   Blood pressure (!) 156/84, pulse 87, temperature 98.9 F (37.2 C), temperature source Oral, resp. rate 18, SpO2 100 %. Physical Exam  Constitutional: She appears well-developed.  HENT:  Head: Normocephalic.  Eyes: Pupils are equal, round, and reactive to light.  Cardiovascular: Normal rate.  Respiratory: Effort normal.  GI: Soft.  Moderate truncal obesity.   Genitourinary:    Genitourinary Comments: No CVAT   Musculoskeletal:        General: Normal range of motion.     Cervical back: Normal range of motion.  Neurological: She is alert.  Skin: Skin is warm.     Assessment/Plan  Proceed as planned with LEFT robotic partial nephrectomy. Risks, benefits, alternative, expected peri-op course discussed previously and reiterated today.   Alexis Frock, MD 12/10/2019, 6:39 AM

## 2019-12-10 NOTE — Discharge Instructions (Signed)

## 2019-12-10 NOTE — Anesthesia Procedure Notes (Signed)
Procedure Name: Intubation Date/Time: 12/10/2019 7:43 AM Performed by: Talbot Grumbling, CRNA Pre-anesthesia Checklist: Emergency Drugs available, Suction available and Patient being monitored Patient Re-evaluated:Patient Re-evaluated prior to induction Oxygen Delivery Method: Circle system utilized Preoxygenation: Pre-oxygenation with 100% oxygen Induction Type: IV induction Ventilation: Mask ventilation without difficulty Laryngoscope Size: Mac and 3 Tube type: Oral Tube size: 7.5 mm Number of attempts: 1 Airway Equipment and Method: Stylet Placement Confirmation: ETT inserted through vocal cords under direct vision,  positive ETCO2 and breath sounds checked- equal and bilateral Secured at: 22 cm Tube secured with: Tape Dental Injury: Teeth and Oropharynx as per pre-operative assessment

## 2019-12-10 NOTE — Anesthesia Procedure Notes (Signed)
Arterial Line Insertion Start/End2/19/2021 7:00 AM, 12/10/2019 7:15 AM Performed by: Murvin Natal, MD, Talbot Grumbling, CRNA, CRNA  Patient location: Pre-op. Preanesthetic checklist: patient identified, IV checked, site marked, risks and benefits discussed, surgical consent, monitors and equipment checked, pre-op evaluation, timeout performed and anesthesia consent Lidocaine 1% used for infiltration and patient sedated radial was placed Catheter size: 20 G Hand hygiene performed  and maximum sterile barriers used   Attempts: 1 Procedure performed without using ultrasound guided technique. Following insertion, dressing applied and Biopatch. Post procedure assessment: normal  Patient tolerated the procedure well with no immediate complications.

## 2019-12-10 NOTE — Anesthesia Postprocedure Evaluation (Signed)
Anesthesia Post Note  Patient: Jenna Chavez  Procedure(s) Performed: XI ROBOTIC ASSITED PARTIAL NEPHRECTOMY (Left )     Patient location during evaluation: PACU Anesthesia Type: General Level of consciousness: awake and alert Pain management: pain level controlled Vital Signs Assessment: post-procedure vital signs reviewed and stable Respiratory status: spontaneous breathing, nonlabored ventilation, respiratory function stable and patient connected to nasal cannula oxygen Cardiovascular status: blood pressure returned to baseline and stable Postop Assessment: no apparent nausea or vomiting Anesthetic complications: no    Last Vitals:  Vitals:   12/10/19 1143 12/10/19 1528  BP: (!) 144/80 (!) 157/88  Pulse: 80 94  Resp: 18 17  Temp: 36.5 C 36.7 C  SpO2: 100% 100%    Last Pain:  Vitals:   12/10/19 1528  TempSrc: Oral  PainSc:                  [name] P Dionte [place]in

## 2019-12-10 NOTE — Brief Op Note (Signed)
12/10/2019  10:23 AM  PATIENT:  Jenna Chavez  54 y.o. female  PRE-OPERATIVE DIAGNOSIS:  LEFT RENAL MASS  POST-OPERATIVE DIAGNOSIS:  LEFT RENAL MASS  PROCEDURE:  Procedure(s) with comments: XI ROBOTIC ASSITED PARTIAL NEPHRECTOMY (Left) - 3 HRS  SURGEON:  Surgeon(s) and Role:    Alexis Frock, MD - Primary  PHYSICIAN ASSISTANT:   ASSISTANTS: Debbrah Alar PA   ANESTHESIA:   local and general  EBL:  50 mL   BLOOD ADMINISTERED:none  DRAINS: 1 - JP to bulb; 2 - foley to gravity   LOCAL MEDICATIONS USED:  MARCAINE     SPECIMEN:  Source of Specimen:  left partial nephrectomy  DISPOSITION OF SPECIMEN:  PATHOLOGY  COUNTS:  YES  TOURNIQUET:  * No tourniquets in log *  DICTATION: .Other Dictation: Dictation Number  [id number]  PLAN OF CARE: Admit to inpatient   PATIENT DISPOSITION:  PACU - hemodynamically stable.   Delay start of Pharmacological VTE agent (>24hrs) due to surgical blood loss or risk of bleeding: yes

## 2019-12-10 NOTE — Transfer of Care (Signed)
Immediate Anesthesia Transfer of Care Note  Patient: Jenna Chavez  Procedure(s) Performed: XI ROBOTIC ASSITED PARTIAL NEPHRECTOMY (Left )  Patient Location: PACU  Anesthesia Type:General  Level of Consciousness: sedated  Airway & Oxygen Therapy: Patient Spontanous Breathing and Patient connected to face mask oxygen  Post-op Assessment: Report given to RN and Post -op Vital signs reviewed and stable  Post vital signs: Reviewed and stable  Last Vitals:  Vitals Value Taken Time  BP    Temp    Pulse 83 12/10/19 1028  Resp    SpO2 100 % 12/10/19 1028  Vitals shown include unvalidated device data.  Last Pain:  Vitals:   12/10/19 0616  TempSrc:   PainSc: 0-No pain         Complications: No apparent anesthesia complications

## 2019-12-11 DIAGNOSIS — D1771 Benign lipomatous neoplasm of kidney: Secondary | ICD-10-CM | POA: Diagnosis not present

## 2019-12-11 LAB — BASIC METABOLIC PANEL
Anion gap: 13 (ref 5–15)
BUN: 9 mg/dL (ref 6–20)
CO2: 20 mmol/L — ABNORMAL LOW (ref 22–32)
Calcium: 9 mg/dL (ref 8.9–10.3)
Chloride: 104 mmol/L (ref 98–111)
Creatinine, Ser: 0.95 mg/dL (ref 0.44–1.00)
GFR calc Af Amer: >60 mL/min (ref >60)
GFR calc non Af Amer: >60 mL/min (ref >60)
Glucose, Bld: 114 mg/dL — ABNORMAL HIGH (ref 70–99)
Potassium: 4.4 mmol/L (ref 3.5–5.1)
Sodium: 137 mmol/L (ref 135–145)

## 2019-12-11 LAB — HEMOGLOBIN AND HEMATOCRIT, BLOOD
HCT: 44.7 % (ref 36.0–46.0)
Hemoglobin: 13.6 g/dL (ref 12.0–15.0)

## 2019-12-11 LAB — CREATININE, FLUID (PLEURAL, PERITONEAL, JP DRAINAGE): Creat, Fluid: 1 mg/dL

## 2019-12-11 MED ORDER — CHLORHEXIDINE GLUCONATE CLOTH 2 % EX PADS: 6.0000 | MEDICATED_PAD | Freq: Every day | CUTANEOUS | Status: DC

## 2019-12-11 MED ORDER — TRAMADOL HCL 50 MG PO TABS: 50.0000 mg | ORAL_TABLET | Freq: Four times a day (QID) | ORAL | 0 refills | Status: DC | PRN

## 2019-12-11 MED ORDER — BISACODYL 10 MG RE SUPP
10.0000 mg | Freq: Once | RECTAL | Status: AC
  Administered 2019-12-11: 10 mg via RECTAL
  Filled 2019-12-11: qty 1

## 2019-12-11 NOTE — Progress Notes (Signed)
Patient ID: Jenna Chavez, female   DOB: 11/24/1965, 54 y.o.   MRN: [id number]  1 Day Post-Op Subjective: Pt doing well.  Pain controlled.  She has ambulated already.  Tolerating clears well.  Objective: Vital signs in last 24 hours: Temp:  [97.6 F (36.4 C)-98.8 F (37.1 C)] 98.2 F (36.8 C) (02/20 [id number]) Pulse Rate:  [62-94] 62 (02/20 0608) Resp:  [11-22] 17 (02/20 0608) BP: (118-157)/(66-88) 118/69 (02/20 0608) SpO2:  [99 %-100 %] 100 % (02/20 [id number]) Arterial Line BP: (121-153)/(67-74) 121/74 (02/19 1100) Weight:  [95.8 kg] 95.8 kg (02/19 1244)  Intake/Output from previous day: 02/19 0701 - 02/20 0700 In: 3985.6 [P.O.:960; I.V.:2925.6; IV Piggyback:100] Out: [id number] [Urine:3125; Drains:10; Blood:50] Intake/Output this shift: No intake/output data recorded.  Physical Exam:  General: Alert and oriented Abdomen: Soft, ND, drain with serosanguinous drainage Incisions: C/D/I GU: Urine clear Ext: NT, No erythema  Lab Results: Recent Labs    12/10/19 1033 12/11/19 0523  HGB 11.8* 13.6  HCT 36.9 44.7   BMET Recent Labs    12/11/19 0523  NA 137  K 4.4  CL 104  CO2 20*  GLUCOSE 114*  BUN 9  CREATININE 0.95  CALCIUM 9.0     Studies/Results: No results found.  Assessment/Plan: POD # 1 s/p left RAL partial nephrectomy - Ambulate, IS, DVT prophylaxis - Advance diet - D/C urethral catheter - Check drain Cr - Oral pain medication - Path pending - Re-evaluate this afternoon for d/c today or tomorrow   LOS: 0 days   Dutch Gray 12/11/2019, 8:10 AM

## 2019-12-11 NOTE — Op Note (Signed)
Jenna Chavez, Jenna Chavez MEDICAL RECORD [id number] ACCOUNT [id number] DATE OF BIRTH:14-Nov-1965 FACILITY: [facility] LOCATION: [facility] PHYSICIAN:Marvyn Torrez, MD  OPERATIVE REPORT  DATE OF PROCEDURE:  12/10/2019  PREOPERATIVE DIAGNOSIS:  Enlarging cystic left renal mass.  PROCEDURE:   Left robotic-assisted laparoscopic partial nephrectomy.  ESTIMATED BLOOD LOSS:  50 mL.  COMPLICATIONS:  None.  SPECIMENS:   1.  Left cystic renal mass for pathology. 2.  Deep margin of renal cystic mass.  FINDINGS: 1.  Duplex ureters as anticipated, both draping over the cystic mass. 2.  One artery, 1 vein, left renal vascular anatomy with prominent lumbar vein. 3.  Predominantly cystic mass with medial aspect containing large nodule, lateral aspect, simple appearing.  SURGEON:  Alexis Frock, MD  ASSISTANT:  Debbrah Alar, PA  DRAINS:   1.  Jackson-Pratt drain to bulb suction. 2.  Foley catheter.  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  It was mildly complex for a number of years.  She has been on surveillance of this.  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  She also has duplex ureters and the cyst does extend towards her renal hilum.  I have discussed for  management including continued surveillance versus ablative therapies versus surgical extirpation with or without nephron sparing and we agreed upon left partial nephrectomy as being optimal mode.  Informed consent was obtained and placed in the medical  record.  DESCRIPTION OF PROCEDURE:  The patient being identified, the procedure being left partial nephrectomy was confirmed, procedure timeout was performed.  Intravenous antibiotics administered.  General endotracheal anesthesia induced.  The patient was placed  into a left side up, full flank position, pulling 15 degrees of table flexion, superior arm elevator, axillary roll, sequential compression devices, bottom leg bent, top leg straight.  She was further fastened to operative table using beanbag and 3 inch  tape over supraxiphoid chest and her pelvis.  Next, a sterile field was created by prepping and draping the patient's left flank using chlorhexidine gluconate and a high-flow, low-pressure pneumoperitoneum was obtained using Veress technique in the left  lower quadrant, having passed the aspiration and drop test.  Robotic camera port was then placed in position approximately 1 handbreadth superolateral to the umbilicus.  Laparoscopic examination of peritoneal cavity revealed no significant adhesions, no  visceral injury.  Distal ports were placed as follows:  Left subcostal 8 mm robotic port, left inferior 8 mm robotic port, left far lateral 8 mm robotic port approximately 4 fingerbreadths superomedial to the anterior iliac spine, 12 mm assistant port  site in the midline supraumbilical crease and another 12 mm assistant port site AirSeal-type in a plane approximately 2 fingerbreadths superior to the camera port, also in the midline.  Robot was docked and passed the electronic checks.  Attention was  directed at developing the retroperitoneum.  This was made lateral to the descending colon from the area of the splenic flexure towards the area of the internal ring.  The colon was carefully swept medially.  The mesentery was thin, but very carefully  swept away from anterior to Gerota fascia, taking exquisite care to avoid any chronic devascularization.  Lateral splenic attachments were taken down using cautery scissors, allowing the spleen to rotate medially and self-retract and allowing the  pancreas to also rotate medially and  self-retract away from the anterior surface of Gerota fascia.  The lower pole of the kidney area was identified, placed on gentle  lateral traction.  Dissection proceeded medial to this.  Duplex ureters were  encountered, as was a very prominent gonadal vein.  These were placed on gentle lateral traction.  Dissection proceeded towards the renal hilum.  Renal hilum consisted of a single artery, single vein renal vascular anatomy with a prominent lumbar vein.   The lumbar vein did limit access to the artery visualization somewhat.  Therefore, it was controlled using a Hem-o-Lok clip x2, proximal and distal, which then allowed a much better window to the single renal artery which was circumferentially mobilized  and marked with a vessel loop.  Next, the ureters were also each marked with a vessel loop and very carefully dissected towards the area of the renal hilum circumferentially.  The lower medial cystic mass was encountered during this dissection and indeed  the ureters were very intimately involved with this.  They were very carefully dissected away from the medial surface of the mass and the anterior surface of the mass respectively.  The mass was then circumferentially mobilized ____ parenchymal border  was clearly seen in all aspects as expected.  The superomedial aspect of this was intimately involved with the hilum, including direct apposition of the posterior ____ renal vein.  A partial nephrectomy was then performed, purposely entering the cyst  cavity, draining it and excising as much of the wall as possible.  It was estimated that approximately 90% of the wall was excised, with remaining 10% being a noncomplex mucosal surface in apposition to the renal parenchyma.  The partial nephrectomy  specimen set aside from pathology and placed in EndoCatch bag.  The small amount of mucosal surface in apposition to the renal parenchyma was then fulgurated using coagulation current energy to deepithelialize the area and a deep margin of this was set  aside  prior to coagulation.  Following this, hemostasis appeared excellent.  All  sponge and needle counts were correct.  We achieved the goals of surgery today.  Notably, the renal artery clamping was not required.  All vessel loops were removed.   Gerota fascia was reapproximated using running 3-0 V-Loc.  A closed suction drain was brought through the previous lateral-most robotic port site near the peritoneal cavity.  The superiormost assistant port site was closed at the level of the fascia  using Carter-Thomason suture passer and 0 Vicryl and the specimen was retrieved via the inferior port site.  It was set aside from pathology.  The wounds were closed in a deep dermal fashion using Vicryl.  All incision sites were infiltrated with dilute  lipolyzed Marcaine and closed at the level of the skin using subcuticular Monocryl and Dermabond.  Procedure was terminated.  The patient tolerated the procedure well.  No immediate complications.  The patient was taken to the postanesthesia care unit in  stable condition with plan for admission.  Please note, first assistant Debbrah Alar was crucial for all portions of the surgery today.  She provided invaluable vascular clipping, vessel loop manipulation, suctioning, retraction and general first assistance.  VN/NUANCE  D:12/10/2019 T:12/10/2019 JOB:[id number]

## 2019-12-11 NOTE — Discharge Summary (Signed)
  Date of admission: 12/10/2019  Date of discharge: 12/11/2019  Admission diagnosis: Left renal neoplasm  Discharge diagnosis: Left renal neoplasm  Secondary diagnoses: Hypertension, hyperlipidemia, GERD.  History and Physical: For full details, please see admission history and physical. Briefly, Jenna Chavez is a 54 y.o. year old patient with a left renal neoplasm.   Hospital Course: She underwent a left RAL partial nephrectomy on 10/08/20.  She remained hemodynamically stable on POD # 1 and her renal function was stable.  Her drain Cr was serum and was removed.  Her diet was advanced, she ambulated well, and her pain was well controlled with oral medication.  She was discharged home on POD #1.  Laboratory values:  Recent Labs    12/10/19 1033 12/11/19 0523  HGB 11.8* 13.6  HCT 36.9 44.7   Recent Labs    12/11/19 0523  CREATININE 0.95    Disposition: Home  Discharge instruction: The patient was instructed to be ambulatory but told to refrain from heavy lifting, strenuous activity, or driving.   Discharge medications:  Allergies as of 12/11/2019      Reactions   Hydrocodone    Hallucinations    Dilaudid [hydromorphone Hcl] Palpitations      Medication List    TAKE these medications   nebivolol 5 MG tablet Commonly known as: BYSTOLIC Take 5 mg by mouth daily.   Bystolic 10 MG tablet Generic drug: nebivolol Take 10 mg by mouth daily.   pantoprazole 20 MG tablet Commonly known as: PROTONIX Take 1 tablet (20 mg total) by mouth daily.   pravastatin 40 MG tablet Commonly known as: PRAVACHOL Take 40 mg by mouth daily.   traMADol 50 MG tablet Commonly known as: ULTRAM Take 1-2 tablets (50-100 mg total) by mouth every 6 (six) hours as needed (pain).       Followup:  Follow-up Information    Alexis Frock, MD On 12/23/2019.   Specialty: Urology Why: at 10 AM for MD visit.  Contact information: [address] [place] [address] [phone]

## 2019-12-13 ENCOUNTER — Ambulatory Visit (INDEPENDENT_AMBULATORY_CARE_PROVIDER_SITE_OTHER): Payer: BC Managed Care – PPO

## 2019-12-13 DIAGNOSIS — R002 Palpitations: Secondary | ICD-10-CM | POA: Diagnosis not present

## 2019-12-13 LAB — SURGICAL PATHOLOGY

## 2019-12-14 ENCOUNTER — Other Ambulatory Visit: Payer: Self-pay

## 2020-01-11 NOTE — Progress Notes (Signed)
Cardiology Office Note    Date:  01/17/2020   ID:  Jenna Chavez, DOB [birth year], MRN [id number]  PCP:  Redmond School, MD  Cardiologist: Rozann Lesches, MD EPS: None  Chief Complaint  Patient presents with  . Follow-up    History of Present Illness:  Jenna Chavez is a 54 y.o. female with family history of early CAD mother died in her 57s of MI Father had heart disease and sister has a defibrillator after an episode of myocarditis.  Patient has hyperlipidemia and impaired glucose tolerance.   Patient saw Dr. Domenic Polite 08/13/2018 with atypical chest pain and normal EKG.  Myoview was ordered but never done.  Echo in 2018 normal LVEF 55 to 60%.  Holter monitor 2018 normal sinus rhythm with occasional PACs and PVCs   I saw the patient 11/22/2019 for preop clearance before partial nephrectomy.  She was having a lot of palpitations and Bystolic was increased by Dr. Brenton Grills which helped some.  She was not having any chest pain so I cleared her for surgery after 2D echo came back with normal LV function.  Recommended coronary CTA in the future with family history of CAD.  Monitor normal sinus rhythm occasional PACs and PVCs.  No significant arrhythmia.   Patient comes in today. Had partial nephrectomy in Feb and it was benign as was her breast biopsy.  Having very few palpitations. Now concerned about her weight.  Denies chest pain, dyspnea, dizziness or presyncope.  Just starting to get back walking after partial nephrectomy.    Past Medical History:  Diagnosis Date  . Anemia    2008  . Anginal pain ([place])   . Constipation   . Gastric ulcer 1999   EGD per Dr. Gala Romney, negative for malignancy  . GERD (gastroesophageal reflux disease)   . Glucose intolerance (impaired glucose tolerance)   . History of kidney stones   . Hypertension   . Palpitations 2019  . Renal cyst, left     Past Surgical History:  Procedure Laterality Date  . CHOLECYSTECTOMY    . COLONOSCOPY  06/21/2011   RMR: normal rectum, colon, TI, repeat Aug 2017  . COLONOSCOPY N/A 04/10/2017   Procedure: COLONOSCOPY;  Surgeon: Daneil Dolin, MD;  Location: [facility];  Service: Endoscopy;  Laterality: N/A;  1:45pm  . ESOPHAGOGASTRODUODENOSCOPY  06/21/2011   Planned but not done at time of TCS, no need  . OOPHORECTOMY     and fallopian tube on left side  . PARTIAL HYSTERECTOMY    . Right toe surgery    . ROBOTIC ASSITED PARTIAL NEPHRECTOMY Left 12/10/2019   Procedure: XI ROBOTIC ASSITED PARTIAL NEPHRECTOMY;  Surgeon: Alexis Frock, MD;  Location: [facility];  Service: Urology;  Laterality: Left;  3 HRS  . WISDOM TOOTH EXTRACTION      Current Medications: Current Meds  Medication Sig  . BYSTOLIC 10 MG tablet Take 10 mg by mouth daily.   . nebivolol (BYSTOLIC) 5 MG tablet Take 5 mg by mouth daily.  . pantoprazole (PROTONIX) 20 MG tablet Take 1 tablet (20 mg total) by mouth daily.  . pravastatin (PRAVACHOL) 40 MG tablet Take 40 mg by mouth daily.      Allergies:   Hydrocodone and Dilaudid [hydromorphone hcl]   Social History   Socioeconomic History  . Marital status: Married    Spouse name: Not on file  . Number of children: Not on file  . Years of education: Not on file  . Highest  education level: Not on file  Occupational History  . Not on file  Tobacco Use  . Smoking status: Never Smoker  . Smokeless tobacco: Never Used  Substance and Sexual Activity  . Alcohol use: No  . Drug use: No  . Sexual activity: Not on file  Other Topics Concern  . Not on file  Social History Narrative  . Not on file   Social Determinants of Health   Financial Resource Strain:   . Difficulty of Paying Living Expenses:   Food Insecurity:   . Worried About Charity fundraiser in the Last Year:   . Arboriculturist in the Last Year:   Transportation Needs:   . Film/video editor (Medical):   Marland Kitchen Lack of Transportation (Non-Medical):   Physical Activity:   . Days of Exercise per Week:   . Minutes  of Exercise per Session:   Stress:   . Feeling of Stress :   Social Connections:   . Frequency of Communication with Friends and Family:   . Frequency of Social Gatherings with Friends and Family:   . Attends Religious Services:   . Active Member of Clubs or Organizations:   . Attends Archivist Meetings:   Marland Kitchen Marital Status:      Family History:  The patient's  family history includes Colon polyps in her father; Heart attack in her mother; Heart disease in her father and sister.   ROS:   Please see the history of present illness.    ROS All other systems reviewed and are negative.   PHYSICAL EXAM:   VS:  BP 140/62   Pulse 70   Ht 5' 6.5" (1.689 m)   Wt 213 lb (96.6 kg)   SpO2 99%   BMI 33.86 kg/m   Physical Exam  GEN: Obese, in no acute distress  Neck: no JVD, carotid bruits, or masses Cardiac:RRR; no murmurs, rubs, or gallops  Respiratory:  clear to auscultation bilaterally, normal work of breathing GI: soft, nontender, nondistended, + BS Ext: without cyanosis, clubbing, or edema, Good distal pulses bilaterally Neuro:  Alert and Oriented x 3 Psych: euthymic mood, full affect  Wt Readings from Last 3 Encounters:  01/17/20 213 lb (96.6 kg)  12/10/19 211 lb 3.2 oz (95.8 kg)  12/07/19 211 lb 6 oz (95.9 kg)      Studies/Labs Reviewed:   EKG:  EKG is not ordered today.    Recent Labs: 12/07/2019: Platelets 328 12/11/2019: BUN 9; Creatinine, Ser 0.95; Hemoglobin 13.6; Potassium 4.4; Sodium 137   Lipid Panel No results found for: CHOL, TRIG, HDL, CHOLHDL, VLDL, LDLCALC, LDLDIRECT  Additional studies/ records that were reviewed today include:   2/23/20212-week monitor Zio patch reviewed. 13 days 20 hours analyzed. Predominant rhythm is sinus with heart rate ranging from 49 bpm up to 112 bpm and average heart rate 70 bpm. Rare PACs and PVCs were noted representing less than 1% of total beats. There were no sustained arrhythmias or pauses.   2D echo  2/5/2021IMPRESSIONS     1. Left ventricular ejection fraction, by visual estimation, is 60 to  65%. The left ventricle has normal function. There is mildly increased  left ventricular hypertrophy.   2. The left ventricle has no regional wall motion abnormalities.   3. Global right ventricle has normal systolic function.The right  ventricular size is normal. No increase in right ventricular wall  thickness.   4. Left atrial size was normal.   5. Right atrial  size was normal.   6. The mitral valve is normal in structure. Trivial mitral valve  regurgitation.   7. The tricuspid valve is normal in structure.   8. The tricuspid valve is normal in structure. Tricuspid valve  regurgitation is not demonstrated.   9. The aortic valve is tricuspid. Aortic valve regurgitation is not  visualized. No evidence of aortic valve sclerosis or stenosis.  10. The pulmonic valve was normal in structure. Pulmonic valve  regurgitation is trivial.  11. The inferior vena cava is normal in size with greater than 50%  respiratory variability, suggesting right atrial pressure of 3 mmHg.   FINDINGS   Left Ventricle: Left ventricular ejection fraction, by visual estimation,  is 60 to 65%. The left ventricle has normal function. The left ventricle  has no regional wall motion abnormalities. The left ventricular internal  cavity size was the left  ventricle is normal in size. There is mildly increased left ventricular  hypertrophy. Concentric left ventricular hypertrophy. Left ventricular  diastolic parameters were normal.   Right Ventricle: The right ventricular size is normal. No increase in  right ventricular wall thickness. Global RV systolic function is has  normal systolic function.   Left Atrium: Left atrial size was normal in size.   Right Atrium: Right atrial size was normal in size   Pericardium: There is no evidence of pericardial effusion.   Mitral Valve: The mitral valve is normal in  structure. Trivial mitral  valve regurgitation.   Tricuspid Valve: The tricuspid valve is normal in structure. Tricuspid  valve regurgitation is not demonstrated.   Aortic Valve: The aortic valve is tricuspid. Aortic valve regurgitation is  not visualized. The aortic valve is structurally normal, with no evidence  of sclerosis or stenosis.   Pulmonic Valve: The pulmonic valve was normal in structure. Pulmonic valve  regurgitation is trivial. Pulmonic regurgitation is trivial.   Aorta: The aortic root is normal in size and structure.   Venous: The inferior vena cava is normal in size with greater than 50%  respiratory variability, suggesting right atrial pressure of 3 mmHg.   IAS/Shunts: No atrial level shunt detected by color flow Doppler.        ASSESSMENT:2D echo 11/25/2018 one 2D echo 11/26/2019    1. Palpitations   2. Hyperlipidemia, unspecified hyperlipidemia type   3. Family history of early CAD   77. Obesity (BMI 30.0-34.9)      PLAN:  In order of problems listed above:  Palpitations with monitor 11/2019 normal sinus rhythm 49-112 bpm rare PACs and PVCs less than 1% of total beats.  No sustained arrhythmias.  Normal LV function on 2D echo. No recent palpitations since partial nephrectomy.  Hyperlipidemia on Pravachol managed by PCP  Family history of early CAD and prior history of chest pain.  To have coronary CTA at [facility] if possible because of insurance purposes.  Obesity-interested in referral to weight loss center Dr. Tally Due  Medication Adjustments/Labs and Tests Ordered: Current medicines are reviewed at length with the patient today.  Concerns regarding medicines are outlined above.  Medication changes, Labs and Tests ordered today are listed in the Patient Instructions below. Patient Instructions  Medication Instructions:  Your physician recommends that you continue on your current medications as directed. Please refer to the Current Medication list  given to you today.  *If you need a refill on your cardiac medications before your next appointment, please call your pharmacy*   Lab Work: NONE   If you  have labs (blood work) drawn today and your tests are completely normal, you will receive your results only by: Marland Kitchen MyChart Message (if you have MyChart) OR . A paper copy in the mail If you have any lab test that is abnormal or we need to change your treatment, we will call you to review the results.   Testing/Procedures: NONE    Follow-Up: At [facility], you and your health needs are our priority.  As part of our continuing mission to provide you with exceptional heart care, we have created designated Provider Care Teams.  These Care Teams include your primary Cardiologist (physician) and Advanced Practice Providers (APPs -  Physician Assistants and Nurse Practitioners) who all work together to provide you with the care you need, when you need it.  We recommend signing up for the patient portal called "MyChart".  Sign up information is provided on this After Visit Summary.  MyChart is used to connect with patients for Virtual Visits (Telemedicine).  Patients are able to view lab/test results, encounter notes, upcoming appointments, etc.  Non-urgent messages can be sent to your provider as well.   To learn more about what you can do with MyChart, go to [email or web address].    Your next appointment:   6 month(s)  The format for your next appointment:   In Person  Provider:   Rozann Lesches, MD   Other Instructions Thank you for choosing [facility]!       Sumner Boast, PA-C  01/17/2020 11:22 AM    [facility] [place], [place],   [address] Phone: [phone]; Fax: [phone]

## 2020-01-17 ENCOUNTER — Encounter: Payer: Self-pay | Admitting: Physician Assistant

## 2020-01-17 ENCOUNTER — Other Ambulatory Visit: Payer: Self-pay

## 2020-01-17 ENCOUNTER — Ambulatory Visit (INDEPENDENT_AMBULATORY_CARE_PROVIDER_SITE_OTHER): Payer: BC Managed Care – PPO | Admitting: Physician Assistant

## 2020-01-17 VITALS — BP 140/62 | HR 70 | Ht 66.5 in | Wt 213.0 lb

## 2020-01-17 DIAGNOSIS — E669 Obesity, unspecified: Secondary | ICD-10-CM | POA: Diagnosis not present

## 2020-01-17 DIAGNOSIS — Z8249 Family history of ischemic heart disease and other diseases of the circulatory system: Secondary | ICD-10-CM

## 2020-01-17 DIAGNOSIS — R002 Palpitations: Secondary | ICD-10-CM

## 2020-01-17 DIAGNOSIS — E785 Hyperlipidemia, unspecified: Secondary | ICD-10-CM | POA: Diagnosis not present

## 2020-01-17 NOTE — Patient Instructions (Signed)
Medication Instructions:  Your physician recommends that you continue on your current medications as directed. Please refer to the Current Medication list given to you today.  *If you need a refill on your cardiac medications before your next appointment, please call your pharmacy*   Lab Work: NONE   If you have labs (blood work) drawn today and your tests are completely normal, you will receive your results only by: . MyChart Message (if you have MyChart) OR . A paper copy in the mail If you have any lab test that is abnormal or we need to change your treatment, we will call you to review the results.   Testing/Procedures: NONE    Follow-Up: At [facility], you and your health needs are our priority.  As part of our continuing mission to provide you with exceptional heart care, we have created designated Provider Care Teams.  These Care Teams include your primary Cardiologist (physician) and Advanced Practice Providers (APPs -  Physician Assistants and Nurse Practitioners) who all work together to provide you with the care you need, when you need it.  We recommend signing up for the patient portal called "MyChart".  Sign up information is provided on this After Visit Summary.  MyChart is used to connect with patients for Virtual Visits (Telemedicine).  Patients are able to view lab/test results, encounter notes, upcoming appointments, etc.  Non-urgent messages can be sent to your provider as well.   To learn more about what you can do with MyChart, go to [email or web address].    Your next appointment:   6 month(s)  The format for your next appointment:   In Person  Provider:   Samuel McDowell, MD   Other Instructions Thank you for choosing [facility]!    

## 2020-02-07 ENCOUNTER — Other Ambulatory Visit: Payer: Self-pay

## 2020-02-07 ENCOUNTER — Encounter (INDEPENDENT_AMBULATORY_CARE_PROVIDER_SITE_OTHER): Payer: Self-pay | Admitting: Bariatrics

## 2020-02-07 ENCOUNTER — Ambulatory Visit (INDEPENDENT_AMBULATORY_CARE_PROVIDER_SITE_OTHER): Payer: BC Managed Care – PPO | Admitting: Bariatrics

## 2020-02-07 VITALS — BP 160/89 | HR 70 | Temp 97.9°F | Ht 66.0 in | Wt 214.0 lb

## 2020-02-07 DIAGNOSIS — R0602 Shortness of breath: Secondary | ICD-10-CM | POA: Diagnosis not present

## 2020-02-07 DIAGNOSIS — E7849 Other hyperlipidemia: Secondary | ICD-10-CM

## 2020-02-07 DIAGNOSIS — I10 Essential (primary) hypertension: Secondary | ICD-10-CM | POA: Diagnosis not present

## 2020-02-07 DIAGNOSIS — R5383 Other fatigue: Secondary | ICD-10-CM

## 2020-02-07 DIAGNOSIS — Z9189 Other specified personal risk factors, not elsewhere classified: Secondary | ICD-10-CM

## 2020-02-07 DIAGNOSIS — Z6834 Body mass index (BMI) 34.0-34.9, adult: Secondary | ICD-10-CM

## 2020-02-07 DIAGNOSIS — K5909 Other constipation: Secondary | ICD-10-CM

## 2020-02-07 DIAGNOSIS — Z1331 Encounter for screening for depression: Secondary | ICD-10-CM

## 2020-02-07 DIAGNOSIS — G4739 Other sleep apnea: Secondary | ICD-10-CM

## 2020-02-07 DIAGNOSIS — E669 Obesity, unspecified: Secondary | ICD-10-CM

## 2020-02-07 DIAGNOSIS — R7309 Other abnormal glucose: Secondary | ICD-10-CM

## 2020-02-07 DIAGNOSIS — Z0289 Encounter for other administrative examinations: Secondary | ICD-10-CM

## 2020-02-08 ENCOUNTER — Encounter (INDEPENDENT_AMBULATORY_CARE_PROVIDER_SITE_OTHER): Payer: Self-pay | Admitting: Bariatrics

## 2020-02-08 NOTE — Progress Notes (Signed)
Dear Dr. Redmond School,   Thank you for referring Jenna Chavez to our clinic. The following note includes my evaluation and treatment recommendations.  Chief Complaint:   OBESITY Jenna Chavez (MR# [id number]) is a 54 y.o. female who presents for evaluation and treatment of obesity and related comorbidities. Current BMI is Body mass index is 34.54 kg/m.Marland Kitchen Jenna Chavez has been struggling with her weight for many years and has been unsuccessful in either losing weight, maintaining weight loss, or reaching her healthy weight goal.  Jenna Chavez is currently in the action stage of change and ready to dedicate time achieving and maintaining a healthier weight. Jenna Chavez is interested in becoming our patient and working on intensive lifestyle modifications including (but not limited to) diet and exercise for weight loss.  Jenna Chavez gets takeout about 5 times per week. She craves sweets and salty snacks. She skips breakfast and lunch.  Jenna Chavez's habits were reviewed today and are as follows: Her family eats meals together, her desired weight loss is 39-44 lbs, she started gaining weight after pregnancy in 1993, her heaviest weight ever was 230 pounds, she craves sweet and salty snacks, she skips breakfast 3 times per week and lunch 4 times per week, she has binge eating behaviors and she struggles with emotional eating.  Depression Screen Jenna Chavez's Food and Mood (modified PHQ-9) score was 3.  Depression screen PHQ 2/9 02/07/2020  Decreased Interest 1  Down, Depressed, Hopeless 0  PHQ - 2 Score 1  Altered sleeping 0  Tired, decreased energy 1  Change in appetite 1  Feeling bad or failure about yourself  0  Trouble concentrating 0  Moving slowly or fidgety/restless 0  Suicidal thoughts 0  PHQ-9 Score 3   Subjective:   Other fatigue. Jenna Chavez denies daytime somnolence and denies waking up still tired. Jenna Chavez generally gets 7-9 hours of sleep per night, and states that she has generally restful sleep. Snoring  is present. Apneic episodes are not present. Epworth Sleepiness Score is 2.  SOB (shortness of breath) on exertion. Jenna Chavez notes increasing shortness of breath with certain activities and seems to be worsening over time with weight gain. She notes getting out of breath sooner with activity than she used to. This has not gotten worse recently. Jenna Chavez denies shortness of breath at rest or orthopnea.  Essential hypertension. Jenna Chavez is on Bystolic. Blood pressure is not well controlled. She states she took her medication this a.m.  BP Readings from Last 3 Encounters:  02/07/20 (!) 160/89  01/17/20 140/62  12/11/19 (!) 148/79   Lab Results  Component Value Date   CREATININE 0.95 12/11/2019   CREATININE 1.02 (H) 12/07/2019   CREATININE 0.97 11/09/2019   Other hyperlipidemia. Jenna Chavez is taking pravastatin and denies side effects.   Lab Results  Component Value Date   CHOL 243 (H) 02/07/2020   HDL 86 02/07/2020   LDLCALC 139 (H) 02/07/2020   TRIG 106 02/07/2020   Lab Results  Component Value Date   ALT 17 12/12/2018   AST 17 12/12/2018   ALKPHOS 58 12/12/2018   BILITOT 0.4 12/12/2018   The 10-year ASCVD risk score Jenna Chavez., et al., 2013) is: 5.9%   Values used to calculate the score:     Age: 34 years     Sex: Female     Is Non-Hispanic African American: Yes     Diabetic: No     Tobacco smoker: No     Systolic Blood Pressure: 0000000 mmHg  Is BP treated: Yes     HDL Cholesterol: 86 mg/dL     Total Cholesterol: 243 mg/dL  Elevated glucose. Jenna Chavez has a history of some elevated blood glucose readings without a diagnosis of diabetes. Glucose 114 on 12/11/2019.  Other constipation. Jenna Chavez is taking Colace and MiraLax.  Other sleep apnea. Jenna Chavez does not wear CPAP. She had an at home sleep study.  Depression screening. Jenna Chavez had a negative depression screen with a PHQ-9 score of 3.  At risk for activity intolerance. Jenna Chavez is at risk of exercise intolerance due to  fatigue.  Assessment/Plan:   Other fatigue. Jenna Chavez does feel that her weight is causing her energy to be lower than it should be. Fatigue may be related to obesity, depression or many other causes. Labs will be ordered, and in the meanwhile, Jenna Chavez will focus on self care including making healthy food choices, increasing physical activity and focusing on stress reduction. EKG 12-Lead, VITAMIN D 25 Hydroxy (Vit-D Deficiency, Fractures), Vitamin B12, T3, T4, free, TSH tests ordered today.  SOB (shortness of breath) on exertion. Jenna Chavez does not feel that she gets out of breath more easily that she used to when she exercises. Zuley's shortness of breath appears to be obesity related and exercise induced. She has agreed to work on weight loss and gradually increase exercise to treat her exercise induced shortness of breath. Will continue to monitor closely. Lipid Panel With LDL/HDL Ratio labs ordered today.  Essential hypertension. Jenna Chavez is working on healthy weight loss and exercise to improve blood pressure control. We will watch for signs of hypotension as she continues her lifestyle modifications. She will continue her medication as directed.  Other hyperlipidemia. Cardiovascular risk and specific lipid/LDL goals reviewed.  We discussed several lifestyle modifications today and Jenna Chavez will continue to work on diet, exercise and weight loss efforts. Orders and follow up as documented in patient record. Jenna Chavez will continue her medication as directed. Lipid Panel With LDL/HDL Ratio labs ordered today.  Counseling Intensive lifestyle modifications are the first line treatment for this issue. . Dietary changes: Increase soluble fiber. Decrease simple carbohydrates. . Exercise changes: Moderate to vigorous-intensity aerobic activity 150 minutes per week if tolerated. . Lipid-lowering medications: see documented in medical record.    Elevated glucose. Fasting labs will be obtained and results with be  discussed with Jenna Chavez in 2 weeks at her follow up visit. In the meanwhile Jenna Chavez was started on a lower simple carbohydrate diet and will work on weight loss efforts. Hemoglobin A1c, Insulin, random labs ordered today.  Other constipation. Jenna Chavez was informed that a decrease in bowel movement frequency is normal while losing weight, but stools should not be hard or painful. Orders and follow up as documented in patient record. Jenna Chavez will continue her medications as directed. We explained that increased protein may worsen constipation. She will increase her water intake.  Counseling Getting to Good Bowel Health: Your goal is to have one soft bowel movement each day. Drink at least 8 glasses of water each day. Eat plenty of fiber (goal is over 25 grams each day). It is best to get most of your fiber from dietary sources which includes leafy green vegetables, fresh fruit, and whole grains. You may need to add fiber with the help of OTC fiber supplements. These include Metamucil, Citrucel, and Flaxseed. If you are still having trouble, try adding Miralax or Magnesium Citrate. If all of these changes do not work, Cabin crew.  Other sleep apnea. Intensive lifestyle  modifications are the first line treatment for this issue. We discussed several lifestyle modifications today and she will continue to work on diet, exercise and weight loss efforts. We will continue to monitor. Orders and follow up as documented in patient record.   Counseling  Sleep apnea is a condition in which breathing pauses or becomes shallow during sleep. This happens over and over during the night. This disrupts your sleep and keeps your body from getting the rest that it needs, which can cause tiredness and lack of energy (fatigue) during the day.  Sleep apnea treatment: If you were given a device to open your airway while you sleep, USE IT!  Sleep hygiene:   Limit or avoid alcohol, caffeinated beverages, and cigarettes,  especially close to bedtime.   Do not eat a large meal or eat spicy foods right before bedtime. This can lead to digestive discomfort that can make it hard for you to sleep.  Keep a sleep diary to help you and your health care provider figure out what could be causing your insomnia.  . Make your bedroom a dark, comfortable place where it is easy to fall asleep. ? Put up shades or blackout curtains to block light from outside. ? Use a white noise machine to block noise. ? Keep the temperature cool. . Limit screen use before bedtime. This includes: ? Watching TV. ? Using your smartphone, tablet, or computer. . Stick to a routine that includes going to bed and waking up at the same times every day and night. This can help you fall asleep faster. Consider making a quiet activity, such as reading, part of your nighttime routine. . Try to avoid taking naps during the day so that you sleep better at night. . Get out of bed if you are still awake after 15 minutes of trying to sleep. Keep the lights down, but try reading or doing a quiet activity. When you feel sleepy, go back to bed.  Depression screening. Jenna Chavez had a negative depression screen.  At risk for activity intolerance. Jenna Chavez was given approximately 15 minutes of exercise intolerance counseling today. She is 54 y.o. female and has risk factors exercise intolerance including obesity. We discussed intensive lifestyle modifications today with an emphasis on specific weight loss instructions and strategies. Jenna Chavez will slowly increase activity as tolerated.  Repetitive spaced learning was employed today to elicit superior memory formation and behavioral change.  Class 1 obesity with serious comorbidity and body mass index (BMI) of 34.0 to 34.9 in adult, unspecified obesity type.  Jenna Chavez is currently in the action stage of change and her goal is to continue with weight loss efforts. I recommend Jenna Chavez begin the structured treatment plan as  follows:  She has agreed to the Category 2 Plan.  We independently reviewed with the patient labs from 12/11/2019 including BMP, hemoglobin and hematocrit, and glucose.  She will work on meal planning.  Exercise goals: All adults should avoid inactivity. Some physical activity is better than none, and adults who participate in any amount of physical activity gain some health benefits.   Behavioral modification strategies: increasing lean protein intake, decreasing simple carbohydrates, increasing vegetables, increasing water intake, decreasing eating out, no skipping meals, meal planning and cooking strategies, keeping healthy foods in the home and planning for success.  She was informed of the importance of frequent follow-up visits to maximize her success with intensive lifestyle modifications for her multiple health conditions. She was informed we would discuss her lab results at  her next visit unless there is a critical issue that needs to be addressed sooner. Jenna Chavez agreed to keep her next visit at the agreed upon time to discuss these results.  Objective:   Blood pressure (!) 160/89, pulse 70, temperature 97.9 F (36.6 C), height 5\' 6"  (1.676 m), weight 214 lb (97.1 kg), SpO2 100 %. Body mass index is 34.54 kg/m.  EKG: Normal sinus rhythm, rate 66. Within normal limits.  Indirect Calorimeter completed today shows a VO2 of 219 and a REE of 1523.  Her calculated basal metabolic rate is [id number] thus her basal metabolic rate is worse than expected.  General: Cooperative, alert, well developed, in no acute distress. HEENT: Conjunctivae and lids unremarkable. Cardiovascular: Regular rhythm.  Lungs: Normal work of breathing. Neurologic: No focal deficits.   Lab Results  Component Value Date   CREATININE 0.95 12/11/2019   BUN 9 12/11/2019   NA 137 12/11/2019   K 4.4 12/11/2019   CL 104 12/11/2019   CO2 20 (L) 12/11/2019   Lab Results  Component Value Date   ALT 17 12/12/2018    AST 17 12/12/2018   ALKPHOS 58 12/12/2018   BILITOT 0.4 12/12/2018   No results found for: HGBA1C No results found for: INSULIN Lab Results  Component Value Date   TSH 0.59 06/27/2017   No results found for: CHOL, HDL, LDLCALC, LDLDIRECT, TRIG, CHOLHDL Lab Results  Component Value Date   WBC 7.2 12/07/2019   HGB 13.6 12/11/2019   HCT 44.7 12/11/2019   MCV 91.1 12/07/2019   PLT 328 12/07/2019   No results found for: IRON, TIBC, FERRITIN  Attestation Statements:   Reviewed by clinician on day of visit: allergies, medications, problem list, medical history, surgical history, family history, social history, and previous encounter notes.  Migdalia Dk, am acting as Location manager for [employer], DO   I have reviewed the above documentation for accuracy and completeness, and I agree with the above. Jearld Lesch, DO

## 2020-02-09 ENCOUNTER — Encounter (INDEPENDENT_AMBULATORY_CARE_PROVIDER_SITE_OTHER): Payer: Self-pay | Admitting: Bariatrics

## 2020-02-09 DIAGNOSIS — R7303 Prediabetes: Secondary | ICD-10-CM | POA: Insufficient documentation

## 2020-02-10 ENCOUNTER — Other Ambulatory Visit (INDEPENDENT_AMBULATORY_CARE_PROVIDER_SITE_OTHER): Payer: Self-pay | Admitting: Bariatrics

## 2020-02-11 LAB — LIPID PANEL WITH LDL/HDL RATIO
Cholesterol, Total: 243 mg/dL — ABNORMAL HIGH (ref 100–199)
HDL: 86 mg/dL (ref >39)
LDL Chol Calc (NIH): 139 mg/dL — ABNORMAL HIGH (ref 0–99)
LDL/HDL Ratio: 1.6 ratio (ref 0.0–3.2)
Triglycerides: 106 mg/dL (ref 0–149)
VLDL Cholesterol Cal: 18 mg/dL (ref 5–40)

## 2020-02-11 LAB — VITAMIN D 25 HYDROXY (VIT D DEFICIENCY, FRACTURES)
Vit D, 25-Hydroxy: 12 ng/mL — ABNORMAL LOW (ref 30.0–100.0)
Vit D, 25-Hydroxy: 11.1 ng/mL — ABNORMAL LOW (ref 30.0–100.0)

## 2020-02-11 LAB — VITAMIN B12: Vitamin B-12: 556 pg/mL (ref 232–1245)

## 2020-02-11 LAB — INSULIN, RANDOM: INSULIN: 25.4 u[IU]/mL — ABNORMAL HIGH (ref 2.6–24.9)

## 2020-02-11 LAB — TSH: TSH: 0.625 u[IU]/mL (ref 0.450–4.500)

## 2020-02-11 LAB — T3: T3, Total: 121 ng/dL (ref 71–180)

## 2020-02-11 LAB — HEMOGLOBIN A1C
Est. average glucose Bld gHb Est-mCnc: 128 mg/dL
Hgb A1c MFr Bld: 6.1 % — ABNORMAL HIGH (ref 4.8–5.6)

## 2020-02-11 LAB — T4, FREE: Free T4: 1.02 ng/dL (ref 0.82–1.77)

## 2020-02-15 ENCOUNTER — Encounter (INDEPENDENT_AMBULATORY_CARE_PROVIDER_SITE_OTHER): Payer: Self-pay | Admitting: Bariatrics

## 2020-02-15 DIAGNOSIS — E559 Vitamin D deficiency, unspecified: Secondary | ICD-10-CM | POA: Insufficient documentation

## 2020-02-21 ENCOUNTER — Ambulatory Visit (INDEPENDENT_AMBULATORY_CARE_PROVIDER_SITE_OTHER): Payer: BC Managed Care – PPO | Admitting: Bariatrics

## 2020-02-21 ENCOUNTER — Other Ambulatory Visit: Payer: Self-pay

## 2020-02-21 ENCOUNTER — Encounter (INDEPENDENT_AMBULATORY_CARE_PROVIDER_SITE_OTHER): Payer: Self-pay | Admitting: Bariatrics

## 2020-02-21 VITALS — BP 148/83 | HR 69 | Temp 98.0°F | Ht 66.0 in | Wt 212.0 lb

## 2020-02-21 DIAGNOSIS — R7303 Prediabetes: Secondary | ICD-10-CM

## 2020-02-21 DIAGNOSIS — E559 Vitamin D deficiency, unspecified: Secondary | ICD-10-CM | POA: Diagnosis not present

## 2020-02-21 DIAGNOSIS — Z6834 Body mass index (BMI) 34.0-34.9, adult: Secondary | ICD-10-CM

## 2020-02-21 DIAGNOSIS — E669 Obesity, unspecified: Secondary | ICD-10-CM | POA: Diagnosis not present

## 2020-02-21 DIAGNOSIS — Z9189 Other specified personal risk factors, not elsewhere classified: Secondary | ICD-10-CM | POA: Diagnosis not present

## 2020-02-21 MED ORDER — VITAMIN D (ERGOCALCIFEROL) 1.25 MG (50000 UNIT) PO CAPS: 50000.0000 [IU] | ORAL_CAPSULE | ORAL | 0 refills | Status: DC

## 2020-02-21 NOTE — Progress Notes (Signed)
Chief Complaint:   OBESITY Jenna Chavez is here to discuss her progress with her obesity treatment plan along with follow-up of her obesity related diagnoses. Jenna Chavez is on the Category 2 Plan and states she is following her eating plan approximately 40% of the time. Jenna Chavez states she is exercising 0 minutes 0 times per week.  Today's visit was #: 2 Starting weight: 214 lbs Starting date: 02/07/2020 Today's weight: 212 lbs Today's date: 02/21/2020 Total lbs lost to date: 2 Total lbs lost since last in-office visit: 2  Interim History: Jenna Chavez is here for her first follow-up and is down 2 lbs.  Subjective:   Vitamin D deficiency. Jenna Chavez is not taking Vitamin D supplementation. Last Vitamin D 11.1 on 02/10/2020.  Prediabetes. Jenna Chavez has a diagnosis of prediabetes based on her elevated HgA1c and was informed this puts her at greater risk of developing diabetes. She continues to work on diet and exercise to decrease her risk of diabetes. She denies nausea or hypoglycemia. No polyphagia.  Lab Results  Component Value Date   HGBA1C 6.1 (H) 02/07/2020   Lab Results  Component Value Date   INSULIN 25.4 (H) 02/07/2020   At risk for hypoglycemia. Jenna Chavez is at increased risk for hypoglycemia due to prediabetes.  Assessment/Plan:   Vitamin D deficiency. Low Vitamin D level contributes to fatigue and are associated with obesity, breast, and colon cancer. She was given a prescription for Vitamin D, Ergocalciferol, (DRISDOL) 1.25 MG (50000 UNIT) CAPS capsule every week #4 with 0 refills and will follow-up for routine testing of Vitamin D, at least 2-3 times per year to avoid over-replacement.    Prediabetes. Jenna Chavez will continue to work on weight loss, exercise, increasing healthy fats and protein, and decreasing simple carbohydrates to help decrease the risk of diabetes.   At risk for hypoglycemia. Jenna Chavez was given approximately 15 minutes of counseling today regarding prevention of  hypoglycemia. She was advised of symptoms of hypoglycemia. Jenna Chavez was instructed to avoid skipping meals, eat regular protein rich meals and schedule low calorie snacks as needed.   Repetitive spaced learning was employed today to elicit superior memory formation and behavioral change.  Class 1 obesity with serious comorbidity and body mass index (BMI) of 34.0 to 34.9 in adult, unspecified obesity type.  Jenna Chavez is currently in the action stage of change. As such, her goal is to continue with weight loss efforts. She has agreed to the Category 2 Plan.   She will work on meal planning.  We reviewed labs with the patient including lipids, A1c, insulin, Vitamin D, and thyroid panel.  Exercise goals: All adults should avoid inactivity. Some physical activity is better than none, and adults who participate in any amount of physical activity gain some health benefits.  Behavioral modification strategies: increasing lean protein intake, decreasing simple carbohydrates, increasing vegetables, increasing water intake, decreasing eating out, no skipping meals, meal planning and cooking strategies, keeping healthy foods in the home and planning for success.  Jenna Chavez has agreed to follow-up with our clinic in 2 weeks. She was informed of the importance of frequent follow-up visits to maximize her success with intensive lifestyle modifications for her multiple health conditions.   Objective:   Blood pressure (!) 148/83, pulse 69, temperature 98 F (36.7 C), height 5\' 6"  (1.676 m), weight 212 lb (96.2 kg), SpO2 100 %. Body mass index is 34.22 kg/m.  General: Cooperative, alert, well developed, in no acute distress. HEENT: Conjunctivae and lids unremarkable. Cardiovascular: Regular  rhythm.  Lungs: Normal work of breathing. Neurologic: No focal deficits.   Lab Results  Component Value Date   CREATININE 0.95 12/11/2019   BUN 9 12/11/2019   NA 137 12/11/2019   K 4.4 12/11/2019   CL 104 12/11/2019    CO2 20 (L) 12/11/2019   Lab Results  Component Value Date   ALT 17 12/12/2018   AST 17 12/12/2018   ALKPHOS 58 12/12/2018   BILITOT 0.4 12/12/2018   Lab Results  Component Value Date   HGBA1C 6.1 (H) 02/07/2020   Lab Results  Component Value Date   INSULIN 25.4 (H) 02/07/2020   Lab Results  Component Value Date   TSH 0.625 02/07/2020   Lab Results  Component Value Date   CHOL 243 (H) 02/07/2020   HDL 86 02/07/2020   LDLCALC 139 (H) 02/07/2020   TRIG 106 02/07/2020   Lab Results  Component Value Date   WBC 7.2 12/07/2019   HGB 13.6 12/11/2019   HCT 44.7 12/11/2019   MCV 91.1 12/07/2019   PLT 328 12/07/2019   No results found for: IRON, TIBC, FERRITIN  Attestation Statements:   Reviewed by clinician on day of visit: allergies, medications, problem list, medical history, surgical history, family history, social history, and previous encounter notes.  Migdalia Dk, am acting as Location manager for [employer], DO   I have reviewed the above documentation for accuracy and completeness, and I agree with the above. Jearld Lesch, DO

## 2020-03-07 ENCOUNTER — Other Ambulatory Visit: Payer: Self-pay

## 2020-03-07 ENCOUNTER — Encounter (INDEPENDENT_AMBULATORY_CARE_PROVIDER_SITE_OTHER): Payer: Self-pay | Admitting: Bariatrics

## 2020-03-07 ENCOUNTER — Ambulatory Visit (INDEPENDENT_AMBULATORY_CARE_PROVIDER_SITE_OTHER): Payer: BC Managed Care – PPO | Admitting: Bariatrics

## 2020-03-07 VITALS — BP 129/82 | HR 69 | Temp 98.1°F | Ht 66.0 in | Wt 213.0 lb

## 2020-03-07 DIAGNOSIS — E669 Obesity, unspecified: Secondary | ICD-10-CM

## 2020-03-07 DIAGNOSIS — Z6834 Body mass index (BMI) 34.0-34.9, adult: Secondary | ICD-10-CM

## 2020-03-07 DIAGNOSIS — E7849 Other hyperlipidemia: Secondary | ICD-10-CM | POA: Diagnosis not present

## 2020-03-07 DIAGNOSIS — I10 Essential (primary) hypertension: Secondary | ICD-10-CM | POA: Diagnosis not present

## 2020-03-07 DIAGNOSIS — E6609 Other obesity due to excess calories: Secondary | ICD-10-CM

## 2020-03-09 ENCOUNTER — Encounter (INDEPENDENT_AMBULATORY_CARE_PROVIDER_SITE_OTHER): Payer: Self-pay | Admitting: Bariatrics

## 2020-03-09 DIAGNOSIS — E6609 Other obesity due to excess calories: Secondary | ICD-10-CM | POA: Insufficient documentation

## 2020-03-09 NOTE — Progress Notes (Signed)
Chief Complaint:   OBESITY Jenna Chavez is here to discuss her progress with her obesity treatment plan along with follow-up of her obesity related diagnoses. Jenna Chavez is on the Category 2 Plan and states she is following her eating plan approximately 30% of the time. Jenna Chavez states she is walking for 20 minutes 2 times per week.  Today's visit was #: 3 Starting weight: 214 lbs Starting date: 02/07/2020 Today's weight: 213 lbs Today's date: 03/07/2020 Total lbs lost to date: 1 lbs Total lbs lost since last in-office visit: 0  Interim History: Jenna Chavez is up 7 pounds.  Her bioimpedence is up about 2.5 pounds.  She is not getting enough water.  Subjective:   1. Essential hypertension Review: taking medications as instructed, no medication side effects noted, no chest pain on exertion, no dyspnea on exertion, no swelling of ankles.  She is taking Bystolic 10 mg daily.  Blood pressure is reasonably well-controlled.  BP Readings from Last 3 Encounters:  03/07/20 129/82  02/21/20 (!) 148/83  02/07/20 (!) 160/89   2. Other hyperlipidemia Jenna Chavez has hyperlipidemia and has been trying to improve her cholesterol levels with intensive lifestyle modification including a low saturated fat diet, exercise and weight loss. She denies any chest pain, claudication or myalgias.  She is taking Pravachol 40 mg daily.  Lab Results  Component Value Date   ALT 17 12/12/2018   AST 17 12/12/2018   ALKPHOS 58 12/12/2018   BILITOT 0.4 12/12/2018   Lab Results  Component Value Date   CHOL 243 (H) 02/07/2020   HDL 86 02/07/2020   LDLCALC 139 (H) 02/07/2020   TRIG 106 02/07/2020   Assessment/Plan:   1. Essential hypertension Jenna Chavez is working on healthy weight loss and exercise to improve blood pressure control. We will watch for signs of hypotension as she continues her lifestyle modifications.  She will continue her current blood pressure medication.  2. Other hyperlipidemia Cardiovascular risk and  specific lipid/LDL goals reviewed.  We discussed several lifestyle modifications today and Jenna Chavez will continue to work on diet, exercise and weight loss efforts. Orders and follow up as documented in patient record.  Jenna Chavez will continue Pravachol.  Counseling Intensive lifestyle modifications are the first line treatment for this issue. . Dietary changes: Increase soluble fiber. Decrease simple carbohydrates. . Exercise changes: Moderate to vigorous-intensity aerobic activity 150 minutes per week if tolerated. . Lipid-lowering medications: see documented in medical record.  3. Class 1 obesity with serious comorbidity and body mass index (BMI) of 34.0 to 34.9 in adult, unspecified obesity type Jenna Chavez is currently in the action stage of change. As such, her goal is to continue with weight loss efforts. She has agreed to the Category 2 Plan.   She will work on meal planning and intentional eating.  Exercise goals: She will add resistance band exercises to her current regimen.  Behavioral modification strategies: increasing lean protein intake, decreasing simple carbohydrates, increasing vegetables, increasing water intake, decreasing eating out, no skipping meals, meal planning and cooking strategies and keeping healthy foods in the home.  Jenna Chavez has agreed to follow-up with our clinic in 2-3 weeks. She was informed of the importance of frequent follow-up visits to maximize her success with intensive lifestyle modifications for her multiple health conditions.   Objective:   Blood pressure 129/82, pulse 69, temperature 98.1 F (36.7 C), height 5\' 6"  (1.676 m), weight 213 lb (96.6 kg), SpO2 100 %. Body mass index is 34.38 kg/m.  General: Cooperative, alert, well  developed, in no acute distress. HEENT: Conjunctivae and lids unremarkable. Cardiovascular: Regular rhythm.  Lungs: Normal work of breathing. Neurologic: No focal deficits.   Lab Results  Component Value Date   CREATININE 0.95  12/11/2019   BUN 9 12/11/2019   NA 137 12/11/2019   K 4.4 12/11/2019   CL 104 12/11/2019   CO2 20 (L) 12/11/2019   Lab Results  Component Value Date   ALT 17 12/12/2018   AST 17 12/12/2018   ALKPHOS 58 12/12/2018   BILITOT 0.4 12/12/2018   Lab Results  Component Value Date   HGBA1C 6.1 (H) 02/07/2020   Lab Results  Component Value Date   INSULIN 25.4 (H) 02/07/2020   Lab Results  Component Value Date   TSH 0.625 02/07/2020   Lab Results  Component Value Date   CHOL 243 (H) 02/07/2020   HDL 86 02/07/2020   LDLCALC 139 (H) 02/07/2020   TRIG 106 02/07/2020   Lab Results  Component Value Date   WBC 7.2 12/07/2019   HGB 13.6 12/11/2019   HCT 44.7 12/11/2019   MCV 91.1 12/07/2019   PLT 328 12/07/2019   Attestation Statements:   Reviewed by clinician on day of visit: allergies, medications, problem list, medical history, surgical history, family history, social history, and previous encounter notes.  Time spent on visit including pre-visit chart review and post-visit care and charting was 20 minutes.   I, Water quality scientist, CMA, am acting as Location manager for [employer], DO.  I have reviewed the above documentation for accuracy and completeness, and I agree with the above. Jenna Lesch, DO

## 2020-04-04 ENCOUNTER — Ambulatory Visit (INDEPENDENT_AMBULATORY_CARE_PROVIDER_SITE_OTHER): Payer: BC Managed Care – PPO | Admitting: Bariatrics

## 2020-07-19 ENCOUNTER — Encounter: Payer: Self-pay | Admitting: Cardiology

## 2020-07-19 NOTE — Progress Notes (Signed)
Cardiology Office Note  Date: 07/20/2020   ID: Amelita Risinger Brougham, DOB 1965/11/14, MRN [id number]  PCP:  Redmond School, MD  Cardiologist:  Rozann Lesches, MD Electrophysiologist:  None   Chief Complaint  Patient presents with  . Cardiac follow-up    History of Present Illness: Jenna Chavez is a 54 y.o. female last seen in March by Ms. Bonnell Public PA-C.  She presents for a follow-up visit.  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  She states that this has been taxing recently, plans to be out of work tomorrow.  She had a recent follow-up CT of the abdomen at [facility] status post resection of left renal lesion that was found to be benign.  There were no acute findings.  She continues to follow with Dr. Gerarda Fraction.  Lisinopril was added to her antihypertensive regimen most recently.  I checked her blood pressure today after the initial reading and found it to be 142/92.  I did talk with her about a walking plan for exercise.  She states that she continues on Pravachol and has had follow-up lab work with her PCP.  Past Medical History:  Diagnosis Date  . Anemia   . Back pain   . Constipation   . Degenerative disc disease, cervical   . Elevated cholesterol   . Gastric ulcer 1999   EGD per Dr. Gala Romney, negative for malignancy  . GERD (gastroesophageal reflux disease)   . Glucose intolerance (impaired glucose tolerance)   . History of kidney stones   . Hypertension   . Palpitations   . Renal cyst, left   . Sleep apnea   . Stomach ulcer     Past Surgical History:  Procedure Laterality Date  . CHOLECYSTECTOMY    . COLONOSCOPY  06/21/2011   RMR: normal rectum, colon, TI, repeat Aug 2017  . COLONOSCOPY N/A 04/10/2017   Procedure: COLONOSCOPY;  Surgeon: Daneil Dolin, MD;  [facility];  Service: Endoscopy;  Laterality: N/A;  1:45pm  . ESOPHAGOGASTRODUODENOSCOPY  06/21/2011   Planned but not done at time of TCS, no need    . OOPHORECTOMY     and fallopian tube on left side  . PARTIAL HYSTERECTOMY    . Right toe surgery    . ROBOTIC ASSITED PARTIAL NEPHRECTOMY Left 12/10/2019   Procedure: XI ROBOTIC ASSITED PARTIAL NEPHRECTOMY;  Surgeon: Alexis Frock, MD;  Location: [facility];  Service: Urology;  Laterality: Left;  3 HRS  . WISDOM TOOTH EXTRACTION      Current Outpatient Medications  Medication Sig Dispense Refill  . BORIC ACID EX Place 600 mg vaginally as needed.    Marland Kitchen BYSTOLIC 10 MG tablet Take 10 mg by mouth daily.     Marland Kitchen docusate sodium (COLACE) 100 MG capsule Take 100 mg by mouth 2 (two) times daily.    . nebivolol (BYSTOLIC) 5 MG tablet Take 5 mg by mouth daily.    . pantoprazole (PROTONIX) 20 MG tablet Take 1 tablet (20 mg total) by mouth daily. 30 tablet 0  . polyethylene glycol (MIRALAX / GLYCOLAX) 17 g packet Take 17 g by mouth daily.    . pravastatin (PRAVACHOL) 40 MG tablet Take 40 mg by mouth daily.     . Vitamin D, Ergocalciferol, (DRISDOL) 1.25 MG (50000 UNIT) CAPS capsule Take 1 capsule (50,000 Units total) by mouth every 7 (seven) days. 4 capsule 0  . lisinopril (ZESTRIL) 2.5 MG tablet Take 2.5  mg by mouth daily.     No current facility-administered medications for this visit.   Allergies:  Hydrocodone and Dilaudid [hydromorphone hcl]   ROS:  No palpitations or syncope.  Physical Exam: VS:  BP (!) 166/108   Pulse 75   Ht 5\' 6"  (1.676 m)   Wt 222 lb 9.6 oz (101 kg)   SpO2 98%   BMI 35.93 kg/m , BMI Body mass index is 35.93 kg/m.  Wt Readings from Last 3 Encounters:  07/20/20 222 lb 9.6 oz (101 kg)  03/07/20 213 lb (96.6 kg)  02/21/20 212 lb (96.2 kg)    General: Patient appears comfortable at rest. HEENT: Conjunctiva and lids normal, wearing a mask. Neck: Supple, no elevated JVP or carotid bruits, no thyromegaly. Lungs: Clear to auscultation, nonlabored breathing at rest. Cardiac: Regular rate and rhythm, no S3 or significant systolic murmur, no pericardial rub. Abdomen:  Soft, nontender, bowel sounds present. Extremities: No pitting edema, distal pulses 2+.  ECG:  An ECG dated 02/07/2020 was personally reviewed today and demonstrated:  Normal sinus rhythm.  Recent Labwork: 12/07/2019: Platelets 328 12/11/2019: BUN 9; Creatinine, Ser 0.95; Hemoglobin 13.6; Potassium 4.4; Sodium 137 02/07/2020: TSH 0.625     Component Value Date/Time   CHOL 243 (H) 02/07/2020 1343   TRIG 106 02/07/2020 1343   HDL 86 02/07/2020 1343   LDLCALC 139 (H) 02/07/2020 1343    Other Studies Reviewed Today:  Echocardiogram 11/26/2019: 1. Left ventricular ejection fraction, by visual estimation, is 60 to  65%. The left ventricle has normal function. There is mildly increased  left ventricular hypertrophy.  2. The left ventricle has no regional wall motion abnormalities.  3. Global right ventricle has normal systolic function.The right  ventricular size is normal. No increase in right ventricular wall  thickness.  4. Left atrial size was normal.  5. Right atrial size was normal.  6. The mitral valve is normal in structure. Trivial mitral valve  regurgitation.  7. The tricuspid valve is normal in structure.  8. The tricuspid valve is normal in structure. Tricuspid valve  regurgitation is not demonstrated.  9. The aortic valve is tricuspid. Aortic valve regurgitation is not  visualized. No evidence of aortic valve sclerosis or stenosis.  10. The pulmonic valve was normal in structure. Pulmonic valve  regurgitation is trivial.  11. The inferior vena cava is normal in size with greater than 50%  respiratory variability, suggesting right atrial pressure of 3 mmHg.   Cardiac monitor February 2021: Zio patch reviewed.  13 days 20 hours analyzed.  Predominant rhythm is sinus with heart rate ranging from 49 bpm up to 112 bpm and average heart rate 70 bpm.  Rare PACs and PVCs were noted representing less than 1% of total beats.  There were no sustained arrhythmias or  pauses.  CT abdomen 06/27/2020 ([facility]): TECHNIQUE: CT abdomen before and after administration of 80 mL Isovue-370. Sagittal and coronal reconstructions with maximum intensity projections are evaluated on the workstation and stored in PACS. Radiation dose reduction was utilized, (automated exposure control, mA or kV adjustment based on patient size, or iterative image reconstruction).   Comparison: 09/28/2019.   INDICATION:Benign lipomatous neoplasm of kidney   FINDINGS:   CHEST:   # Lungs: Lung bases are clear..    # Mediastinum:Heart size is normal..   ABDOMEN AND PELVIS:   # Liver: Normal.   # Gallbladder:Absent.  # Spleen:Normal  # Pancreas:Normal.  # Adrenals:Normal.  # Kidneys: Normal right kidney. Previously described  cystic lesion with enhancing anterior soft tissue off the lower left kidney is no longer identified.  # Ureters: Normal right ureter. 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 Both are normal in caliber.  #Vessels:Normal caliber vessels. Patent left renal vein. Patent visceral arteries..  # Lymph nodes:No retroperitoneal or pelvic adenopathy.  # PP:IRJJO caliber is normal. No evidence of appendicitis, diverticulitis or bowel obstruction.  # MSK: No acute or aggressive bone lesions..   IMPRESSION: Interval removal of the left renal lesion with no evidence of recurrence or metastatic disease.    Assessment and Plan:  1.  Essential hypertension, currently on Bystolic with recent addition of lisinopril per PCP.  Recheck blood pressure today had improved from initial reading but still not optimal.  I talked with her about diet and exercise, a walking plan.  Keep follow-up with Dr. Gerarda Fraction in case further up titration of lisinopril is needed.  2.  Mixed hyperlipidemia, she is on Pravachol for further risk reduction.  Keep follow-up with Dr. Gerarda Fraction.  3.  Family history of premature CAD.  She does not describe any active angina symptoms.  Ischemic testing  modalities have been discussed with her over time, nothing has been pursued as yet.  We will continue with observation.  4.  History of palpitations, no progression or syncope.  Medication Adjustments/Labs and Tests Ordered: Current medicines are reviewed at length with the patient today.  Concerns regarding medicines are outlined above.   Tests Ordered: No orders of the defined types were placed in this encounter.   Medication Changes: No orders of the defined types were placed in this encounter.   Disposition:  Follow up 1 year in the [place] office.  Signed, Satira Sark, MD, [facility] 07/20/2020 1:15 PM    [facility] at [facility] [address] [place], [place] [address] Phone: [phone]; Fax: [phone]

## 2020-07-20 ENCOUNTER — Other Ambulatory Visit: Payer: Self-pay

## 2020-07-20 ENCOUNTER — Encounter: Payer: Self-pay | Admitting: Cardiology

## 2020-07-20 ENCOUNTER — Ambulatory Visit (INDEPENDENT_AMBULATORY_CARE_PROVIDER_SITE_OTHER): Payer: BC Managed Care – PPO | Admitting: Cardiology

## 2020-07-20 VITALS — BP 166/108 | HR 75 | Ht 66.0 in | Wt 222.6 lb

## 2020-07-20 DIAGNOSIS — Z8249 Family history of ischemic heart disease and other diseases of the circulatory system: Secondary | ICD-10-CM

## 2020-07-20 DIAGNOSIS — R002 Palpitations: Secondary | ICD-10-CM | POA: Diagnosis not present

## 2020-07-20 DIAGNOSIS — I10 Essential (primary) hypertension: Secondary | ICD-10-CM | POA: Diagnosis not present

## 2020-07-20 NOTE — Patient Instructions (Signed)
Medication Instructions:  Your physician recommends that you continue on your current medications as directed. Please refer to the Current Medication list given to you today.  *If you need a refill on your cardiac medications before your next appointment, please call your pharmacy*  Lab Work: None today If you have labs (blood work) drawn today and your tests are completely normal, you will receive your results only by: . MyChart Message (if you have MyChart) OR . A paper copy in the mail If you have any lab test that is abnormal or we need to change your treatment, we will call you to review the results.  Testing/Procedures: None today  Follow-Up: At [facility], you and your health needs are our priority.  As part of our continuing mission to provide you with exceptional heart care, we have created designated Provider Care Teams.  These Care Teams include your primary Cardiologist (physician) and Advanced Practice Providers (APPs -  Physician Assistants and Nurse Practitioners) who all work together to provide you with the care you need, when you need it.  Your next appointment:   12 month(s)  The format for your next appointment:   In Person  Provider:   Samuel McDowell, MD  Other Instructions None     Thank you for choosing [facility] !        

## 2020-07-24 ENCOUNTER — Other Ambulatory Visit: Payer: Self-pay | Admitting: Cardiology

## 2020-07-24 MED ORDER — NEBIVOLOL HCL 10 MG PO TABS: 10.0000 mg | ORAL_TABLET | Freq: Every day | ORAL | 3 refills | Status: DC

## 2020-07-24 NOTE — Telephone Encounter (Signed)
Refilled bystolic 10 mg qd to [facility]

## 2020-07-24 NOTE — Telephone Encounter (Signed)
New message      *STAT* If patient is at the pharmacy, call can be transferred to refill team.   1. Which medications need to be refilled? (please list name of each medication and dose if known) BYSTOLIC 10 MG tablet  2. Which pharmacy/location (including street and city if local pharmacy) is medication to be sent to?[facility]   3. Do they need a 30 day or 90 day supply? Jenna Chavez

## 2020-07-31 ENCOUNTER — Other Ambulatory Visit: Payer: Self-pay | Admitting: *Deleted

## 2020-07-31 ENCOUNTER — Other Ambulatory Visit: Payer: BC Managed Care – PPO

## 2020-07-31 DIAGNOSIS — Z20822 Contact with and (suspected) exposure to covid-19: Secondary | ICD-10-CM

## 2020-08-02 LAB — NOVEL CORONAVIRUS, NAA: SARS-CoV-2, NAA: NOT DETECTED

## 2020-08-02 LAB — SPECIMEN STATUS REPORT

## 2020-08-02 LAB — SARS-COV-2, NAA 2 DAY TAT

## 2020-11-17 ENCOUNTER — Encounter (HOSPITAL_COMMUNITY): Payer: Self-pay | Admitting: Emergency Medicine

## 2020-11-17 ENCOUNTER — Emergency Department (HOSPITAL_COMMUNITY)
Admission: EM | Admit: 2020-11-17 | Discharge: 2020-11-17 | Disposition: A | Discharge status: Home / Self Care | Payer: BC Managed Care – PPO | Attending: Emergency Medicine | Admitting: Emergency Medicine

## 2020-11-17 ENCOUNTER — Other Ambulatory Visit: Payer: Self-pay

## 2020-11-17 ENCOUNTER — Emergency Department (HOSPITAL_COMMUNITY): Payer: BC Managed Care – PPO

## 2020-11-17 DIAGNOSIS — I10 Essential (primary) hypertension: Secondary | ICD-10-CM | POA: Insufficient documentation

## 2020-11-17 DIAGNOSIS — Z79899 Other long term (current) drug therapy: Secondary | ICD-10-CM | POA: Insufficient documentation

## 2020-11-17 DIAGNOSIS — M5442 Lumbago with sciatica, left side: Secondary | ICD-10-CM | POA: Insufficient documentation

## 2020-11-17 DIAGNOSIS — M545 Low back pain, unspecified: Secondary | ICD-10-CM | POA: Diagnosis present

## 2020-11-17 LAB — URINALYSIS, ROUTINE W REFLEX MICROSCOPIC
Bilirubin Urine: NEGATIVE
Glucose, UA: NEGATIVE mg/dL
Hgb urine dipstick: NEGATIVE
Ketones, ur: NEGATIVE mg/dL
Leukocytes,Ua: NEGATIVE
Nitrite: NEGATIVE
Protein, ur: NEGATIVE mg/dL
Specific Gravity, Urine: 1.025 (ref 1.005–1.030)
pH: 5 (ref 5.0–8.0)

## 2020-11-17 MED ORDER — HYDROCODONE-ACETAMINOPHEN 5-325 MG PO TABS
1.0000 | ORAL_TABLET | Freq: Once | ORAL | Status: AC
  Administered 2020-11-17: 1 via ORAL
  Filled 2020-11-17: qty 1

## 2020-11-17 MED ORDER — PREDNISONE 50 MG PO TABS: 50.0000 mg | ORAL_TABLET | Freq: Every day | ORAL | 0 refills | Status: AC

## 2020-11-17 MED ORDER — METHOCARBAMOL 500 MG PO TABS
500.0000 mg | ORAL_TABLET | Freq: Once | ORAL | Status: AC
  Administered 2020-11-17: 500 mg via ORAL
  Filled 2020-11-17: qty 1

## 2020-11-17 MED ORDER — HYDROCODONE-ACETAMINOPHEN 5-325 MG PO TABS: 1.0000 | ORAL_TABLET | ORAL | 0 refills | Status: DC | PRN

## 2020-11-17 MED ORDER — LIDOCAINE 5 % EX PTCH: 1.0000 | MEDICATED_PATCH | CUTANEOUS | 0 refills | Status: DC

## 2020-11-17 MED ORDER — PREDNISONE 50 MG PO TABS
60.0000 mg | ORAL_TABLET | Freq: Once | ORAL | Status: AC
  Administered 2020-11-17: 60 mg via ORAL
  Filled 2020-11-17: qty 1

## 2020-11-17 MED ORDER — METHOCARBAMOL 500 MG PO TABS: 500.0000 mg | ORAL_TABLET | Freq: Two times a day (BID) | ORAL | 0 refills | Status: DC

## 2020-11-17 NOTE — ED Provider Notes (Signed)
[facility] EMERGENCY DEPARTMENT Provider Note   CSN: [id number] Arrival date & time: 11/17/20  1884     History Chief Complaint  Patient presents with  . Back Pain    Jenna Chavez is a 55 y.o. female with History Significant for DDD kidney stones, hypertension presents for evaluation of lower back pain.  Started 1 week ago.  Located to middle of her spine and diffusely to her back.  Primarily located in extending down the left posterior aspect of leg over gluteus.  States occasionally when she moves she will get a sharp pain down her right leg however this is not constant.  Worse when she ambulates, moves.  No active malignancy, bowel or bladder incontinence, saddle paresthesia.  No recent falls or injury.  Has had similar pain previously, typically last 3 days and resolves.  No weakness, paresthesias.  Had noted yesterday when she urinated it burned however none currently.  She feels like she is able to fully empty her bladder.  Not feel similar to her prior history of stents.  Denies any hematuria.  Denies fever, chills, nausea, vomiting, chest pain, shortness of breath, abdominal pain, pelvic pain, vaginal discharge, leg weakness, paresthesias, swelling, redness, warmth.  Denies additional aggravating or alleviating factors.  Rates her pain a 10/10, worse with movement.  No history of AAA, dissection.  History obtained from patient and past medical records.  No interpreter used.  HPI     Past Medical History:  Diagnosis Date  . Anemia   . Back pain   . Constipation   . Degenerative disc disease, cervical   . Elevated cholesterol   . Gastric ulcer 1999   EGD per Dr. Gala Romney, negative for malignancy  . GERD (gastroesophageal reflux disease)   . Glucose intolerance (impaired glucose tolerance)   . History of kidney stones   . Hypertension   . Palpitations   . Renal cyst, left   . Sleep apnea   . Stomach ulcer     Patient Active Problem List   Diagnosis Date Noted  . Class  1 obesity due to excess calories with serious comorbidity and body mass index (BMI) of 34.0 to 34.9 in adult 03/09/2020  . Vitamin D deficiency 02/15/2020  . Prediabetes 02/09/2020  . Renal mass 12/10/2019  . Epigastric pain 03/04/2012  . Abdominal pain 01/26/2012  . Constipation 01/26/2012  . Dyspepsia 01/26/2012  . LLQ abdominal pain 06/13/2011    Past Surgical History:  Procedure Laterality Date  . CHOLECYSTECTOMY    . COLONOSCOPY  06/21/2011   RMR: normal rectum, colon, TI, repeat Aug 2017  . COLONOSCOPY N/A 04/10/2017   Procedure: COLONOSCOPY;  Surgeon: Daneil Dolin, MD;  [facility];  Service: Endoscopy;  Laterality: N/A;  1:45pm  . ESOPHAGOGASTRODUODENOSCOPY  06/21/2011   Planned but not done at time of TCS, no need  . OOPHORECTOMY     and fallopian tube on left side  . PARTIAL HYSTERECTOMY    . Right toe surgery    . ROBOTIC ASSITED PARTIAL NEPHRECTOMY Left 12/10/2019   Procedure: XI ROBOTIC ASSITED PARTIAL NEPHRECTOMY;  Surgeon: Alexis Frock, MD;  Location: [facility];  Service: Urology;  Laterality: Left;  3 HRS  . WISDOM TOOTH EXTRACTION       OB History    Gravida  1   Para  1   Term  1   Preterm      AB      Living  1  SAB      IAB      Ectopic      Multiple      Live Births              Family History  Problem Relation Age of Onset  . Colon polyps Father        onset early age  . Heart disease Father   . High Cholesterol Father   . Stroke Father   . Obesity Father   . Heart attack Mother   . Diabetes Mother   . High blood pressure Mother   . Sudden death Mother   . Obesity Mother   . Heart disease Sister        defibrillator after myocarditis  . Colon cancer Neg Hx     Social History   Tobacco Use  . Smoking status: Never Smoker  . Smokeless tobacco: Never Used  Vaping Use  . Vaping Use: Never used  Substance Use Topics  . Alcohol use: No  . Drug use: No    Home Medications Prior to Admission  medications   Medication Sig Start Date End Date Taking? Authorizing Provider  HYDROcodone-acetaminophen (NORCO/VICODIN) 5-325 MG tablet Take 1 tablet by mouth every 4 (four) hours as needed. 11/17/20  Yes Dashiell Franchino A, PA-C  lidocaine (LIDODERM) 5 % Place 1 patch onto the skin daily. Remove & Discard patch within 12 hours or as directed by MD 11/17/20  Yes Lacreshia Bondarenko A, PA-C  methocarbamol (ROBAXIN) 500 MG tablet Take 1 tablet (500 mg total) by mouth 2 (two) times daily. 11/17/20  Yes Toluwanimi Radebaugh A, PA-C  predniSONE (DELTASONE) 50 MG tablet Take 1 tablet (50 mg total) by mouth daily for 4 days. 11/17/20 11/21/20 Yes Cobi Aldape A, PA-C  BORIC ACID EX Place 600 mg vaginally as needed.    [provider]  docusate sodium (COLACE) 100 MG capsule Take 100 mg by mouth 2 (two) times daily.    [provider]  lisinopril (ZESTRIL) 2.5 MG tablet Take 2.5 mg by mouth daily. 07/08/20   [provider]  nebivolol (BYSTOLIC) 10 MG tablet Take 1 tablet (10 mg total) by mouth daily. 07/24/20   Satira Sark, MD  pantoprazole (PROTONIX) 20 MG tablet Take 1 tablet (20 mg total) by mouth daily. 08/10/18   Milton Ferguson, MD  polyethylene glycol (MIRALAX / GLYCOLAX) 17 g packet Take 17 g by mouth daily.    [provider]  pravastatin (PRAVACHOL) 40 MG tablet Take 40 mg by mouth daily.  08/20/19   [provider]  Vitamin D, Ergocalciferol, (DRISDOL) 1.25 MG (50000 UNIT) CAPS capsule Take 1 capsule (50,000 Units total) by mouth every 7 (seven) days. 02/21/20   Georgia Lopes, DO    Allergies    Hydrocodone and Dilaudid [hydromorphone hcl]  Review of Systems   Review of Systems  Constitutional: Negative.   HENT: Negative.   Respiratory: Negative.   Cardiovascular: Negative.   Gastrointestinal: Negative.   Genitourinary: Positive for dysuria (Yesterday, resolved ). Negative for decreased urine volume, difficulty urinating, enuresis, flank pain,  frequency, genital sores, hematuria, pelvic pain and urgency.  Musculoskeletal: Positive for back pain. Negative for arthralgias, gait problem, joint swelling, myalgias, neck pain and neck stiffness.  Skin: Negative.   Neurological: Negative.   All other systems reviewed and are negative.   Physical Exam Updated Vital Signs BP (!) 157/86 (BP Location: Right Arm)   Pulse 71   Temp 98.7 F (  37.1 C) (Oral)   Resp 18   Ht 5' 6.5" (1.689 m)   Wt 100.7 kg   SpO2 99%   BMI 35.29 kg/m   Physical Exam Physical Exam  Constitutional: Pt appears well-developed and well-nourished. No distress.  HENT:  Head: Normocephalic and atraumatic.  Mouth/Throat: Oropharynx is clear and moist. No oropharyngeal exudate.  Eyes: Conjunctivae are normal.  Neck: Normal range of motion. Neck supple.  Full ROM without pain  Cardiovascular: Normal rate, regular rhythm and intact distal pulses.   2+ DP, PT pulses bilaterally Pulmonary/Chest: Effort normal and breath sounds normal. No respiratory distress. Pt has no wheezes.  Abdominal: Soft. Pt exhibits no distension. There is no tenderness, rebound or guarding. No abd bruit or pulsatile mass Musculoskeletal:  Full range of motion of the T-spine and L-spine with flexion, hyperextension, and lateral flexion. No midline tenderness or stepoffs. No tenderness to palpation of the spinous processes of the T-spine or L-spine. Moderate tenderness to palpation of the paraspinous muscles of the L-spine. Positive straight leg raise on left at 40'. Negative on right.  Moderate tenderness to right sciatica, piriformis Lymphadenopathy:    Pt has no cervical adenopathy.  Neurological: Pt is alert. Pt has normal reflexes.  Reflex Scores:      Bicep reflexes are 2+ on the right side and 2+ on the left side.      Brachioradialis reflexes are 2+ on the right side and 2+ on the left side.      Patellar reflexes are 2+ on the right side and 2+ on the left side.      Achilles  reflexes are 2+ on the right side and 2+ on the left side. Speech is clear and goal oriented, follows commands Normal 5/5 strength in upper and lower extremities bilaterally including dorsiflexion and plantar flexion, strong and equal grip strength Sensation normal to light and sharp touch Moves extremities without ataxia, coordination intact Walks slightly hunched over 2/2 pain Normal balance No Clonus Skin: Skin is warm and dry. No rash noted or lesions noted. Pt is not diaphoretic. No erythema, ecchymosis,edema or warmth.  Psychiatric: Pt has a normal mood and affect. Behavior is normal.  Nursing note and vitals reviewed. ED Results / Procedures / Treatments   Labs (all labs ordered are listed, but only abnormal results are displayed) Labs Reviewed  URINALYSIS, ROUTINE W REFLEX MICROSCOPIC - Abnormal; Notable for the following components:      Result Value   APPearance HAZY (*)    All other components within normal limits  URINE CULTURE    EKG None  Radiology DG Lumbar Spine Complete  Result Date: 11/17/2020 CLINICAL DATA:  55 year old female with 1 week of low back pain. No known injury. Symptoms progressed and now radiating to both legs. EXAM: LUMBAR SPINE - COMPLETE 4+ VIEW COMPARISON:  CT Abdomen and Pelvis 04/01/2019. FINDINGS: Normal lumbar segmentation. Mild straightening of lumbar lordosis since 2020. No spondylolisthesis. Stable disc spaces with widespread mild endplate spurring. No pars fracture. Multilevel mild to moderate lumbar facet hypertrophy, maximal at L4-L5. No acute osseous abnormality identified. Visible sacrum and SI joints appear intact. Stable cholecystectomy clips. Numerous pelvic phleboliths. Stable left abdominal surgical clips. Negative visible bowel gas pattern. IMPRESSION: 1. No acute osseous abnormality identified in the lumbar spine. 2. Up to moderate lumbar facet degeneration at L4-L5. Relatively preserved disc spaces. Electronically Signed   By: Genevie Ann M.D.   On: 11/17/2020 10:18    Procedures Procedures   Medications  Ordered in ED Medications  HYDROcodone-acetaminophen (NORCO/VICODIN) 5-325 MG per tablet 1 tablet (1 tablet Oral Given 11/17/20 1037)  methocarbamol (ROBAXIN) tablet 500 mg (500 mg Oral Given 11/17/20 1037)  predniSONE (DELTASONE) tablet 60 mg (60 mg Oral Given 11/17/20 1036)    ED Course  I have reviewed the triage vital signs and the nursing notes.  Pertinent labs & imaging results that were available during my care of the patient were reviewed by me and considered in my medical decision making (see chart for details).  55 year old presents for evaluation of back pain.  Began a week ago.  No traumatic injuries.  Radiates primarily down left leg.  Occasionally get sharp sensation down her right leg with specific movements and then resolves.  History of similar however normally resolves over 3 days.  She is afebrile, nonseptic, non-ill-appearing. No known current malignancy, bowel or bladder incontinence, saddle paresthesia.  I am able to reproduce her pain on exam.  Positive straight leg raise on left with moderate tenderness over left SI, sciatica.  No unilateral leg swelling, redness or warmth.  She is neurovascularly intact.  Heart lungs clear.  Abdomen soft, nontender.  She has no midline pulsatile abdominal mass.  Did have some burning with urination yesterday which self resolved.  She denies any hematuria, negative CVA tap bilaterally.  No abdominal pain currently.  We will plan on imaging, UA and reassess.  UA negative for infection, hematuria DG lumbar with DDD    Patient reassessed.  Pain improved.  Discussed imaging and UA.  Will treat for likely sciatica.  Low suspicion for acute neurosurgical emergency such as cauda equina, discitis, osteomyelitis, transverse myelitis, psoas abscess.  She has negative CVA tap bilaterally, low suspicion for stone, pyelonephritis.  Abdomen soft, nontender.  Low suspicion for  acute intra-abdominal process causing patient's lower back pain.  Discussed follow-up closely outpatient with PCP.  She is agreeable for this.  The patient has been appropriately medically screened and/or stabilized in the ED. I have low suspicion for any other emergent medical condition which would require further screening, evaluation or treatment in the ED or require inpatient management.  Patient is hemodynamically stable and in no acute distress.  Patient able to ambulate in department prior to ED.  Evaluation does not show acute pathology that would require ongoing or additional emergent interventions while in the emergency department or further inpatient treatment.  I have discussed the diagnosis with the patient and answered all questions.  Pain is been managed while in the emergency department and patient has no further complaints prior to discharge.  Patient is comfortable with plan discussed in room and is stable for discharge at this time.  I have discussed strict return precautions for returning to the emergency department.  Patient was encouraged to follow-up with PCP/specialist refer to at discharge.   MDM Rules/Calculators/A&P                           Final Clinical Impression(s) / ED Diagnoses Final diagnoses:  Acute left-sided low back pain with left-sided sciatica    Rx / DC Orders ED Discharge Orders         Ordered    HYDROcodone-acetaminophen (NORCO/VICODIN) 5-325 MG tablet  Every 4 hours PRN        11/17/20 1132    methocarbamol (ROBAXIN) 500 MG tablet  2 times daily        11/17/20 1132    lidocaine (LIDODERM) 5 %  Every 24 hours        11/17/20 1132    predniSONE (DELTASONE) 50 MG tablet  Daily        11/17/20 1132           Evens Meno A, PA-C 11/17/20 1133    Sherwood Gambler, MD 11/20/20 0730

## 2020-11-17 NOTE — ED Triage Notes (Signed)
Patient c/o lower back pain that started suddenly last week with no known injury. Per patient has DDD and usually pain is relieved in 2-3 days but pain is progressively getting worse and now radiates into legs bilaterally. CNS intact. Denies any complications with BMs. Per patient now has some slight dysuria.

## 2020-11-17 NOTE — Discharge Instructions (Signed)
Take the medications as prescribed  Return for new or worsening symptoms such as: Get help right away if: You cannot control when you pee (urinate) or poop (have a bowel movement). You have weakness in any of these areas and it gets worse: Lower back. The area between your hip bones. Butt. Legs. You have redness or swelling of your back. You have a burning feeling when you pee.

## 2020-11-19 LAB — URINE CULTURE: Culture: <10000 — AB

## 2021-01-22 ENCOUNTER — Other Ambulatory Visit (HOSPITAL_COMMUNITY): Payer: Self-pay | Admitting: Internal Medicine

## 2021-01-22 DIAGNOSIS — Z1231 Encounter for screening mammogram for malignant neoplasm of breast: Secondary | ICD-10-CM

## 2021-01-24 ENCOUNTER — Ambulatory Visit (HOSPITAL_COMMUNITY)
Admission: RE | Admit: 2021-01-24 | Discharge: 2021-01-24 | Disposition: A | Discharge status: Home / Self Care | Payer: BC Managed Care – PPO | Source: Ambulatory Visit | Attending: Internal Medicine | Admitting: Internal Medicine

## 2021-01-24 DIAGNOSIS — Z1231 Encounter for screening mammogram for malignant neoplasm of breast: Secondary | ICD-10-CM | POA: Diagnosis present

## 2021-05-05 ENCOUNTER — Other Ambulatory Visit: Payer: Self-pay

## 2021-05-05 ENCOUNTER — Emergency Department (HOSPITAL_COMMUNITY): Payer: BC Managed Care – PPO

## 2021-05-05 ENCOUNTER — Emergency Department (HOSPITAL_COMMUNITY)
Admission: EM | Admit: 2021-05-05 | Discharge: 2021-05-05 | Disposition: A | Discharge status: Home / Self Care | Payer: BC Managed Care – PPO | Attending: Emergency Medicine | Admitting: Emergency Medicine

## 2021-05-05 DIAGNOSIS — I10 Essential (primary) hypertension: Secondary | ICD-10-CM | POA: Diagnosis not present

## 2021-05-05 DIAGNOSIS — Z20822 Contact with and (suspected) exposure to covid-19: Secondary | ICD-10-CM | POA: Insufficient documentation

## 2021-05-05 DIAGNOSIS — R1084 Generalized abdominal pain: Secondary | ICD-10-CM | POA: Diagnosis not present

## 2021-05-05 DIAGNOSIS — R0789 Other chest pain: Secondary | ICD-10-CM | POA: Insufficient documentation

## 2021-05-05 DIAGNOSIS — M549 Dorsalgia, unspecified: Secondary | ICD-10-CM | POA: Insufficient documentation

## 2021-05-05 DIAGNOSIS — R079 Chest pain, unspecified: Secondary | ICD-10-CM | POA: Diagnosis present

## 2021-05-05 DIAGNOSIS — Z79899 Other long term (current) drug therapy: Secondary | ICD-10-CM | POA: Diagnosis not present

## 2021-05-05 LAB — CBC WITH DIFFERENTIAL/PLATELET
Abs Immature Granulocytes: 0.02 10*3/uL (ref 0.00–0.07)
Basophils Absolute: 0.1 10*3/uL (ref 0.0–0.1)
Basophils Relative: 1 %
Eosinophils Absolute: 0.5 10*3/uL (ref 0.0–0.5)
Eosinophils Relative: 7 %
HCT: 45.3 % (ref 36.0–46.0)
Hemoglobin: 14.7 g/dL (ref 12.0–15.0)
Immature Granulocytes: 0 %
Lymphocytes Relative: 32 %
Lymphs Abs: 2.4 10*3/uL (ref 0.7–4.0)
MCH: 29.2 pg (ref 26.0–34.0)
MCHC: 32.5 g/dL (ref 30.0–36.0)
MCV: 89.9 fL (ref 80.0–100.0)
Monocytes Absolute: 0.4 10*3/uL (ref 0.1–1.0)
Monocytes Relative: 5 %
Neutro Abs: 4.3 10*3/uL (ref 1.7–7.7)
Neutrophils Relative %: 55 %
Platelets: 340 10*3/uL (ref 150–400)
RBC: 5.04 MIL/uL (ref 3.87–5.11)
RDW: 14.6 % (ref 11.5–15.5)
WBC: 7.7 10*3/uL (ref 4.0–10.5)
nRBC: 0 % (ref 0.0–0.2)

## 2021-05-05 LAB — COMPREHENSIVE METABOLIC PANEL
ALT: 22 U/L (ref 0–44)
AST: 19 U/L (ref 15–41)
Albumin: 4.6 g/dL (ref 3.5–5.0)
Alkaline Phosphatase: 66 U/L (ref 38–126)
Anion gap: 9 (ref 5–15)
BUN: 12 mg/dL (ref 6–20)
CO2: 24 mmol/L (ref 22–32)
Calcium: 9.6 mg/dL (ref 8.9–10.3)
Chloride: 103 mmol/L (ref 98–111)
Creatinine, Ser: 0.98 mg/dL (ref 0.44–1.00)
GFR, Estimated: >60 mL/min (ref >60)
Glucose, Bld: 164 mg/dL — ABNORMAL HIGH (ref 70–99)
Potassium: 3.9 mmol/L (ref 3.5–5.1)
Sodium: 136 mmol/L (ref 135–145)
Total Bilirubin: 0.5 mg/dL (ref 0.3–1.2)
Total Protein: 8.6 g/dL — ABNORMAL HIGH (ref 6.5–8.1)

## 2021-05-05 LAB — URINALYSIS, ROUTINE W REFLEX MICROSCOPIC
Bilirubin Urine: NEGATIVE
Glucose, UA: NEGATIVE mg/dL
Ketones, ur: NEGATIVE mg/dL
Leukocytes,Ua: NEGATIVE
Nitrite: NEGATIVE
Protein, ur: NEGATIVE mg/dL
Specific Gravity, Urine: 1.005 (ref 1.005–1.030)
pH: 6 (ref 5.0–8.0)

## 2021-05-05 LAB — TROPONIN I (HIGH SENSITIVITY)
Troponin I (High Sensitivity): 2 ng/L (ref <18)
Troponin I (High Sensitivity): 2 ng/L (ref <18)

## 2021-05-05 LAB — LIPASE, BLOOD: Lipase: 37 U/L (ref 11–51)

## 2021-05-05 MED ORDER — HYDROCODONE-ACETAMINOPHEN 5-325 MG PO TABS: 1.0000 | ORAL_TABLET | ORAL | 0 refills | Status: DC | PRN

## 2021-05-05 MED ORDER — ALUM & MAG HYDROXIDE-SIMETH 200-200-20 MG/5ML PO SUSP
30.0000 mL | Freq: Once | ORAL | Status: AC
  Administered 2021-05-05: 30 mL via ORAL
  Filled 2021-05-05: qty 30

## 2021-05-05 MED ORDER — PANTOPRAZOLE SODIUM 20 MG PO TBEC: 20.0000 mg | DELAYED_RELEASE_TABLET | Freq: Every day | ORAL | 0 refills | Status: DC

## 2021-05-05 MED ORDER — LIDOCAINE VISCOUS HCL 2 % MT SOLN
15.0000 mL | Freq: Once | OROMUCOSAL | Status: AC
  Administered 2021-05-05: 15 mL via ORAL
  Filled 2021-05-05: qty 15

## 2021-05-05 NOTE — ED Provider Notes (Signed)
[facility] EMERGENCY DEPARTMENT Provider Note   CSN: [id number] Arrival date & time: 05/05/21  0815     History Chief Complaint  Patient presents with   Chest Pain    Chest pain and upper abd pain on the L side    Jenna Chavez is a 55 y.o. female.  Pt complains of pain in her left chest.  Pt reports pain wraps around to her back.  Pt reports she has pain in her upper abdomen.  Pt reports she thinks symptoms started with taking vitamin d.   The history is provided by the patient. No language interpreter was used.  Chest Pain Pain location:  L chest Pain quality: aching   Pain radiates to:  Does not radiate Pain severity:  Moderate Onset quality:  Gradual Timing:  Constant Chronicity:  New Relieved by:  Nothing Worsened by:  Nothing Associated symptoms: abdominal pain and back pain   Risk factors: hypertension       Past Medical History:  Diagnosis Date   Anemia    Back pain    Constipation    Degenerative disc disease, cervical    Elevated cholesterol    Gastric ulcer 1999   EGD per Dr. Gala Romney, negative for malignancy   GERD (gastroesophageal reflux disease)    Glucose intolerance (impaired glucose tolerance)    History of kidney stones    Hypertension    Palpitations    Renal cyst, left    Sleep apnea    Stomach ulcer     Patient Active Problem List   Diagnosis Date Noted   Class 1 obesity due to excess calories with serious comorbidity and body mass index (BMI) of 34.0 to 34.9 in adult 03/09/2020   Vitamin D deficiency 02/15/2020   Prediabetes 02/09/2020   Renal mass 12/10/2019   Epigastric pain 03/04/2012   Abdominal pain 01/26/2012   Constipation 01/26/2012   Dyspepsia 01/26/2012   LLQ abdominal pain 06/13/2011    Past Surgical History:  Procedure Laterality Date   CHOLECYSTECTOMY     COLONOSCOPY  06/21/2011   RMR: normal rectum, colon, TI, repeat Aug 2017   COLONOSCOPY N/A 04/10/2017   Procedure: COLONOSCOPY;  Surgeon: Daneil Dolin, MD;   [facility];  Service: Endoscopy;  Laterality: N/A;  1:45pm   ESOPHAGOGASTRODUODENOSCOPY  06/21/2011   Planned but not done at time of TCS, no need   OOPHORECTOMY     and fallopian tube on left side   PARTIAL HYSTERECTOMY     Right toe surgery     ROBOTIC ASSITED PARTIAL NEPHRECTOMY Left 12/10/2019   Procedure: XI ROBOTIC ASSITED PARTIAL NEPHRECTOMY;  Surgeon: Alexis Frock, MD;  Location: [facility];  Service: Urology;  Laterality: Left;  3 HRS   WISDOM TOOTH EXTRACTION       OB History     Gravida  1   Para  1   Term  1   Preterm      AB      Living  1      SAB      IAB      Ectopic      Multiple      Live Births              Family History  Problem Relation Age of Onset   Colon polyps Father        onset early age   Heart disease Father    High Cholesterol Father    Stroke  Father    Obesity Father    Heart attack Mother    Diabetes Mother    High blood pressure Mother    Sudden death Mother    Obesity Mother    Heart disease Sister        defibrillator after myocarditis   Colon cancer Neg Hx     Social History   Tobacco Use   Smoking status: Never   Smokeless tobacco: Never  Vaping Use   Vaping Use: Never used  Substance Use Topics   Alcohol use: No   Drug use: No    Home Medications Prior to Admission medications   Medication Sig Start Date End Date Taking? Authorizing Provider  BORIC ACID EX Place 600 mg vaginally as needed.    [provider]  docusate sodium (COLACE) 100 MG capsule Take 100 mg by mouth 2 (two) times daily.    [provider]  HYDROcodone-acetaminophen (NORCO/VICODIN) 5-325 MG tablet Take 1 tablet by mouth every 4 (four) hours as needed. 11/17/20   Henderly, [name] A, PA-C  lidocaine (LIDODERM) 5 % Place 1 patch onto the skin daily. Remove & Discard patch within 12 hours or as directed by MD 11/17/20   Henderly, [name] A, PA-C  lisinopril (ZESTRIL) 2.5 MG tablet Take 2.5 mg by mouth daily.  07/08/20   [provider]  methocarbamol (ROBAXIN) 500 MG tablet Take 1 tablet (500 mg total) by mouth 2 (two) times daily. 11/17/20   Henderly, [name] A, PA-C  nebivolol (BYSTOLIC) 10 MG tablet Take 1 tablet (10 mg total) by mouth daily. 07/24/20   Satira Sark, MD  pantoprazole (PROTONIX) 20 MG tablet Take 1 tablet (20 mg total) by mouth daily. 08/10/18   Milton Ferguson, MD  polyethylene glycol (MIRALAX / GLYCOLAX) 17 g packet Take 17 g by mouth daily.    [provider]  pravastatin (PRAVACHOL) 40 MG tablet Take 40 mg by mouth daily.  08/20/19   [provider]  Vitamin D, Ergocalciferol, (DRISDOL) 1.25 MG (50000 UNIT) CAPS capsule Take 1 capsule (50,000 Units total) by mouth every 7 (seven) days. 02/21/20   Georgia Lopes, DO    Allergies    Hydrocodone and Dilaudid [hydromorphone hcl]  Review of Systems   Review of Systems  Cardiovascular:  Positive for chest pain.  Gastrointestinal:  Positive for abdominal pain.  Musculoskeletal:  Positive for back pain.  All other systems reviewed and are negative.  Physical Exam Updated Vital Signs BP (!) 152/80   Pulse 81   Temp 98.9 F (37.2 C) (Oral)   Resp 15   Ht 5' 6.5" (1.689 m)   Wt 102.5 kg   SpO2 100%   BMI 35.93 kg/m   Physical Exam Vitals and nursing note reviewed.  Constitutional:      Appearance: She is well-developed.  HENT:     Head: Normocephalic.  Cardiovascular:     Rate and Rhythm: Normal rate and regular rhythm.     Heart sounds: Normal heart sounds.  Pulmonary:     Effort: Pulmonary effort is normal.  Abdominal:     General: Bowel sounds are normal. There is no distension.     Palpations: Abdomen is soft.     Tenderness: There is abdominal tenderness.  Musculoskeletal:        General: Normal range of motion.     Cervical back: Normal range of motion.  Neurological:     Mental Status: She is alert and oriented to person, place,  and time.    ED Results / Procedures /  Treatments   Labs (all labs ordered are listed, but only abnormal results are displayed) Labs Reviewed - No data to display  EKG None  Radiology No results found.  Procedures Procedures   Medications Ordered in ED Medications - No data to display  ED Course  I have reviewed the triage vital signs and the nursing notes.  Pertinent labs & imaging results that were available during my care of the patient were reviewed by me and considered in my medical decision making (see chart for details).    MDM Rules/Calculators/A&P                          MDM:  ekg is normal, chest xray is normal  troponin negative x 2.  On reevalaution Pt reports pain more localized to upper left abdomen.  Ct renal  no stone.  Pt given rx for hydrocodone and prilosec.  Pt advised to see her MD on Monday for recheck  Final Clinical Impression(s) / ED Diagnoses Final diagnoses:  Atypical chest pain  Generalized abdominal pain    Rx / DC Orders ED Discharge Orders     None     An After Visit Summary was printed and given to the patient.    Fransico Meadow, [place] 05/05/21 1813    Milton Ferguson, MD 05/07/21 [phone]

## 2021-05-05 NOTE — Discharge Instructions (Addendum)
Return if any problems.  See your Physician for recheck on Monday

## 2021-05-05 NOTE — ED Triage Notes (Signed)
Pt complains on left sided chest pain/upper abdominal pain since 02:00am this morning. Patient states it's a radiating pain. She denies cardiac hx and states that she was recently put on a high dose of vitamin D that she started yesterday.

## 2021-05-06 LAB — SARS CORONAVIRUS 2 (TAT 6-24 HRS): SARS Coronavirus 2: NEGATIVE

## 2021-05-21 NOTE — Progress Notes (Signed)
Cardiology Office Note  Date: 05/22/2021   ID: Jenna Chavez, DOB Apr 27, 1966, MRN [id number]  PCP:  Redmond School, MD  Cardiologist:  Rozann Lesches, MD Electrophysiologist:  None   Chief Complaint  Patient presents with   Cardiac follow-up    History of Present Illness: Jenna Chavez is a 55 y.o. female last seen in September 2021.  She is here for a routine follow-up visit.  She does not report any exertional chest pain with regularity, only occasional sense of palpitations.  She has started walking recently for exercise.  Reports compliance with her medications.  She was seen in the ER in mid July with chest and abdominal discomfort.  ECG showed no acute ST segment changes and high-sensitivity troponin I levels were normal.  I reviewed her recent lab work and also went over her medications.  She continues to follow with Dr. Gerarda Chavez.  Past Medical History:  Diagnosis Date   Anemia    Back pain    Constipation    Degenerative disc disease, cervical    Elevated cholesterol    Gastric ulcer 1999   EGD per Dr. Gala Romney, negative for malignancy   GERD (gastroesophageal reflux disease)    Glucose intolerance (impaired glucose tolerance)    History of kidney stones    Hypertension    Palpitations    Renal cyst, left    Sleep apnea    Stomach ulcer     Past Surgical History:  Procedure Laterality Date   CHOLECYSTECTOMY     COLONOSCOPY  06/21/2011   RMR: normal rectum, colon, TI, repeat Aug 2017   COLONOSCOPY N/A 04/10/2017   Procedure: COLONOSCOPY;  Surgeon: Daneil Dolin, MD;  [facility];  Service: Endoscopy;  Laterality: N/A;  1:45pm   ESOPHAGOGASTRODUODENOSCOPY  06/21/2011   Planned but not done at time of TCS, no need   OOPHORECTOMY     and fallopian tube on left side   PARTIAL HYSTERECTOMY     Right toe surgery     ROBOTIC ASSITED PARTIAL NEPHRECTOMY Left 12/10/2019   Procedure: XI ROBOTIC ASSITED PARTIAL NEPHRECTOMY;  Surgeon: Alexis Frock,  MD;  Location: [facility];  Service: Urology;  Laterality: Left;  3 HRS   WISDOM TOOTH EXTRACTION      Current Outpatient Medications  Medication Sig Dispense Refill   BORIC ACID EX Place 600 mg vaginally as needed.     docusate sodium (COLACE) 100 MG capsule Take 100 mg by mouth 2 (two) times daily.     HYDROcodone-acetaminophen (NORCO/VICODIN) 5-325 MG tablet Take 1 tablet by mouth every 4 (four) hours as needed. 14 tablet 0   lidocaine (LIDODERM) 5 % Place 1 patch onto the skin daily. Remove & Discard patch within 12 hours or as directed by MD 30 patch 0   lisinopril (ZESTRIL) 2.5 MG tablet Take 2.5 mg by mouth daily.     methocarbamol (ROBAXIN) 500 MG tablet Take 1 tablet (500 mg total) by mouth 2 (two) times daily. 20 tablet 0   nebivolol (BYSTOLIC) 10 MG tablet Take 1 tablet (10 mg total) by mouth daily. 90 tablet 3   nebivolol (BYSTOLIC) 5 MG tablet Bystolic 5 mg tablet   1 tablet every day by oral route.     pantoprazole (PROTONIX) 20 MG tablet Take 1 tablet (20 mg total) by mouth daily. 30 tablet 0   polyethylene glycol (MIRALAX / GLYCOLAX) 17 g packet Take 17 g by mouth daily.     pravastatin (  PRAVACHOL) 40 MG tablet Take 40 mg by mouth daily.      Vitamin D, Ergocalciferol, (DRISDOL) 1.25 MG (50000 UNIT) CAPS capsule Take 1 capsule (50,000 Units total) by mouth every 7 (seven) days. 4 capsule 0   No current facility-administered medications for this visit.   Allergies:  Hydrocodone and Dilaudid [hydromorphone hcl]   ROS: No dizziness or syncope.  Physical Exam: VS:  BP (!) 140/94   Pulse 66   Ht 5' 6.5" (1.689 m)   Wt 226 lb (102.5 kg)   SpO2 100%   BMI 35.93 kg/m , BMI Body mass index is 35.93 kg/m.  Wt Readings from Last 3 Encounters:  05/22/21 226 lb (102.5 kg)  05/05/21 226 lb (102.5 kg)  11/17/20 222 lb (100.7 kg)    General: Patient appears comfortable at rest. HEENT: Conjunctiva and lids normal, wearing a mask. Neck: Supple, no elevated JVP or carotid bruits,  no thyromegaly. Lungs: Clear to auscultation, nonlabored breathing at rest. Cardiac: Regular rate and rhythm, no S3 or significant systolic murmur, no pericardial rub. Extremities: No pitting edema.  ECG:  An ECG dated 05/05/2021 was personally reviewed today and demonstrated:  Sinus rhythm.  Recent Labwork: 05/05/2021: ALT 22; AST 19; BUN 12; Creatinine, Ser 0.98; Hemoglobin 14.7; Platelets 340; Potassium 3.9; Sodium 136     Component Value Date/Time   CHOL 243 (H) 02/07/2020 1343   TRIG 106 02/07/2020 1343   HDL 86 02/07/2020 1343   LDLCALC 139 (H) 02/07/2020 1343    Other Studies Reviewed Today:  Echocardiogram 11/26/2019:  1. Left ventricular ejection Chavez, by visual estimation, is 60 to  65%. The left ventricle has normal function. There is mildly increased  left ventricular hypertrophy.   2. The left ventricle has no regional wall motion abnormalities.   3. Global right ventricle has normal systolic function.The right  ventricular size is normal. No increase in right ventricular wall  thickness.   4. Left atrial size was normal.   5. Right atrial size was normal.   6. The mitral valve is normal in structure. Trivial mitral valve  regurgitation.   7. The tricuspid valve is normal in structure.   8. The tricuspid valve is normal in structure. Tricuspid valve  regurgitation is not demonstrated.   9. The aortic valve is tricuspid. Aortic valve regurgitation is not  visualized. No evidence of aortic valve sclerosis or stenosis.  10. The pulmonic valve was normal in structure. Pulmonic valve  regurgitation is trivial.  11. The inferior vena cava is normal in size with greater than 50%  respiratory variability, suggesting right atrial pressure of 3 mmHg.   Cardiac monitor February 2021: Zio patch reviewed.  13 days 20 hours analyzed.  Predominant rhythm is sinus with heart rate ranging from 49 bpm up to 112 bpm and average heart rate 70 bpm.  Rare PACs and PVCs were noted  representing less than 1% of total beats.  There were no sustained arrhythmias or pauses.  Chest x-ray 05/05/2021: FINDINGS: Heart size is normal. Lungs are clear. No pulmonary edema. No free intraperitoneal air beneath the hemidiaphragm.   IMPRESSION: No evidence for acute cardiopulmonary abnormality.  CT abdomen 05/05/2021: IMPRESSION: 1. Nonobstructive renal stones as above. No ureteral stones or obstruction on this study. 2. Small fat containing left inguinal hernia, umbilical hernia, ventral hernia on axial image 39, and ventral hernia on axial image 54. 3. No other acute abnormalities.  Assessment and Plan:  1.  History of palpitations, only rare  symptoms, no associated dizziness or syncope.  Recent ECG normal.  Continue observation on beta-blocker.  2.  Essential hypertension, continues on lisinopril and Bystolic.  Blood pressure mildly elevated today.  I recommended that she continue to check this at home and follow-up with Dr. Gerarda Chavez in case lisinopril needs to be further uptitrated.  3.  Mixed hyperlipidemia, she continues on Pravachol with follow-up by Dr. Gerarda Chavez.  Medication Adjustments/Labs and Tests Ordered: Current medicines are reviewed at length with the patient today.  Concerns regarding medicines are outlined above.   Tests Ordered: No orders of the defined types were placed in this encounter.   Medication Changes: No orders of the defined types were placed in this encounter.   Disposition:  Follow up  1 year.  Signed, Satira Sark, MD, [facility] 05/22/2021 2:11 PM    [facility]. [address], [place], [place] [address] Phone: [phone]; Fax: [phone]

## 2021-05-22 ENCOUNTER — Ambulatory Visit (INDEPENDENT_AMBULATORY_CARE_PROVIDER_SITE_OTHER): Payer: BC Managed Care – PPO | Admitting: Cardiology

## 2021-05-22 ENCOUNTER — Other Ambulatory Visit: Payer: Self-pay

## 2021-05-22 ENCOUNTER — Encounter: Payer: Self-pay | Admitting: Cardiology

## 2021-05-22 VITALS — BP 140/94 | HR 66 | Ht 66.5 in | Wt 226.0 lb

## 2021-05-22 DIAGNOSIS — E782 Mixed hyperlipidemia: Secondary | ICD-10-CM

## 2021-05-22 DIAGNOSIS — R002 Palpitations: Secondary | ICD-10-CM

## 2021-05-22 DIAGNOSIS — I10 Essential (primary) hypertension: Secondary | ICD-10-CM | POA: Diagnosis not present

## 2021-05-22 NOTE — Patient Instructions (Signed)
Medication Instructions:  Your physician recommends that you continue on your current medications as directed. Please refer to the Current Medication list given to you today.  *If you need a refill on your cardiac medications before your next appointment, please call your pharmacy*   Lab Work: None If you have labs (blood work) drawn today and your tests are completely normal, you will receive your results only by: [facility] (if you have MyChart) OR A paper copy in the mail If you have any lab test that is abnormal or we need to change your treatment, we will call you to review the results.   Testing/Procedures: None   Follow-Up: At [facility], you and your health needs are our priority.  As part of our continuing mission to provide you with exceptional heart care, we have created designated Provider Care Teams.  These Care Teams include your primary Cardiologist (physician) and Advanced Practice Providers (APPs -  Physician Assistants and Nurse Practitioners) who all work together to provide you with the care you need, when you need it.  We recommend signing up for the patient portal called "MyChart".  Sign up information is provided on this After Visit Summary.  MyChart is used to connect with patients for Virtual Visits (Telemedicine).  Patients are able to view lab/test results, encounter notes, upcoming appointments, etc.  Non-urgent messages can be sent to your provider as well.   To learn more about what you can do with MyChart, go to [email or web address].    Your next appointment:   12 month(s)  The format for your next appointment:   In Person  Provider:   Rozann Lesches, MD   Other Instructions Thank you for choosing [facility] !

## 2021-06-24 DIAGNOSIS — Z0289 Encounter for other administrative examinations: Secondary | ICD-10-CM

## 2021-07-03 ENCOUNTER — Encounter (INDEPENDENT_AMBULATORY_CARE_PROVIDER_SITE_OTHER): Payer: Self-pay | Admitting: Bariatrics

## 2021-07-03 ENCOUNTER — Other Ambulatory Visit: Payer: Self-pay

## 2021-07-03 ENCOUNTER — Ambulatory Visit (INDEPENDENT_AMBULATORY_CARE_PROVIDER_SITE_OTHER): Payer: BC Managed Care – PPO | Admitting: Bariatrics

## 2021-07-03 VITALS — BP 130/82 | HR 63 | Temp 97.8°F | Ht 66.0 in | Wt 223.0 lb

## 2021-07-03 DIAGNOSIS — Z9189 Other specified personal risk factors, not elsewhere classified: Secondary | ICD-10-CM | POA: Diagnosis not present

## 2021-07-03 DIAGNOSIS — Z1331 Encounter for screening for depression: Secondary | ICD-10-CM

## 2021-07-03 DIAGNOSIS — E559 Vitamin D deficiency, unspecified: Secondary | ICD-10-CM | POA: Diagnosis not present

## 2021-07-03 DIAGNOSIS — E66812 Obesity, class 2: Secondary | ICD-10-CM

## 2021-07-03 DIAGNOSIS — G4739 Other sleep apnea: Secondary | ICD-10-CM

## 2021-07-03 DIAGNOSIS — R1013 Epigastric pain: Secondary | ICD-10-CM

## 2021-07-03 DIAGNOSIS — R7303 Prediabetes: Secondary | ICD-10-CM

## 2021-07-03 DIAGNOSIS — Z6836 Body mass index (BMI) 36.0-36.9, adult: Secondary | ICD-10-CM

## 2021-07-03 DIAGNOSIS — R5383 Other fatigue: Secondary | ICD-10-CM

## 2021-07-03 DIAGNOSIS — I10 Essential (primary) hypertension: Secondary | ICD-10-CM

## 2021-07-03 DIAGNOSIS — E7849 Other hyperlipidemia: Secondary | ICD-10-CM

## 2021-07-03 DIAGNOSIS — R0602 Shortness of breath: Secondary | ICD-10-CM

## 2021-07-03 NOTE — Progress Notes (Signed)
Chief Complaint:   OBESITY Jenna Chavez (MR# [id number]) is a 55 y.o. female who presents for evaluation and treatment of obesity and related comorbidities. Current BMI is Body mass index is 35.99 kg/m. Jenna Chavez has been struggling with her weight for many years and has been unsuccessful in either losing weight, maintaining weight loss, or reaching her healthy weight goal.  Jenna Chavez is currently in the action stage of change and ready to dedicate time achieving and maintaining a healthier weight. Jenna Chavez is interested in becoming our patient and working on intensive lifestyle modifications including (but not limited to) diet and exercise for weight loss.  Jenna Chavez last saw me in the past.  Last visit was on 03/07/2020.  She craves sweets.  Jenna Chavez's habits were reviewed today and are as follows: Her family eats meals together, her desired weight loss is 44 pounds, she started gaining weight after having her son, her heaviest weight ever was 230 pounds, she craves sweets, she skips breakfast and lunch frequently, she is frequently drinking liquids with calories, and she struggles with emotional eating.  Depression Screen Jenna Chavez's Food and Mood (modified PHQ-9) score was 1.  Depression screen PHQ 2/9 07/03/2021  Decreased Interest 1  Down, Depressed, Hopeless 0  PHQ - 2 Score 1  Altered sleeping 0  Tired, decreased energy 0  Change in appetite 0  Feeling bad or failure about yourself  0  Trouble concentrating 0  Moving slowly or fidgety/restless 0  Suicidal thoughts 0  PHQ-9 Score 1  Difficult doing work/chores Not difficult at all   Subjective:   1. Other fatigue Jenna Chavez denies daytime somnolence and denies waking up still tired. Patent has a history of symptoms of morning headache and snoring. Jenna Chavez generally gets  7-9  hours of sleep per night, and states that she has generally restful sleep. Snoring is present. Apneic episodes are not present. Epworth Sleepiness Score is 2.  Occurs  with certain activities.   2. SOB (shortness of breath) on exertion Jenna Chavez notes increasing shortness of breath with exercising and seems to be worsening over time with weight gain. She notes getting out of breath sooner with activity than she used to. This has not gotten worse recently. Jenna Chavez denies shortness of breath at rest or orthopnea.  Occurs with certain activities.  3. Prediabetes Jenna Chavez has a diagnosis of prediabetes based on her elevated HgA1c and was informed this puts her at greater risk of developing diabetes. She continues to work on diet and exercise to decrease her risk of diabetes. She denies nausea or hypoglycemia.  No medications.  Lab Results  Component Value Date   HGBA1C 6.1 (H) 02/07/2020   Lab Results  Component Value Date   INSULIN 25.4 (H) 02/07/2020   4. Vitamin D deficiency She is currently taking OTC vitamin D. She denies nausea, vomiting or muscle weakness.  Lab Results  Component Value Date   VD25OH 11.1 (L) 02/10/2020   VD25OH 12.0 (L) 02/07/2020   5. Dyspepsia Jenna Chavez is taking Protonix for dyspepsia.  6. Other sleep apnea Jenna Chavez has an at home study (mild).  No CPAP needed.  7. Other hyperlipidemia Jenna Chavez has hyperlipidemia and has been trying to improve her cholesterol levels with intensive lifestyle modification including a low saturated fat diet, exercise and weight loss. She denies any chest pain, claudication or myalgias.  Taking pravastatin.  Lab Results  Component Value Date   ALT 22 05/05/2021   AST 19 05/05/2021   ALKPHOS 66 05/05/2021  BILITOT 0.5 05/05/2021   Lab Results  Component Value Date   CHOL 243 (H) 02/07/2020   HDL 86 02/07/2020   LDLCALC 139 (H) 02/07/2020   TRIG 106 02/07/2020   8. Essential hypertension Review: taking medications as instructed, no medication side effects noted, no chest pain on exertion, no dyspnea on exertion, no swelling of ankles.  Taking lisinopril.   BP Readings from Last 3 Encounters:   07/03/21 130/82  05/22/21 (!) 140/94  05/05/21 137/74   9. Depression screen Jenna Chavez was screened for depression as part of her new patient workup today.  PHQ-9 is 1.  10. At risk of diabetes mellitus Jenna Chavez is at higher than average risk for developing diabetes due to obesity.    Assessment/Plan:   1. Other fatigue Jenna Chavez does feel that her weight is causing her energy to be lower than it should be. Fatigue may be related to obesity, depression or many other causes. Labs will be ordered, and in the meanwhile, Jenna Chavez will focus on self care including making healthy food choices, increasing physical activity and focusing on stress reduction.  Continue exercise.  - T3 - T4, free - TSH  2. SOB (shortness of breath) on exertion Jenna Chavez does not feel that she gets out of breath more easily that she used to when she exercises. Jenna Chavez's shortness of breath appears to be obesity related and exercise induced. She has agreed to work on weight loss and gradually increase exercise to treat her exercise induced shortness of breath. Will continue to monitor closely.  Continue exercise.  - T3 - T4, free - TSH  3. Prediabetes Jenna Chavez will continue to work on weight loss, exercise, and decreasing simple carbohydrates to help decrease the risk of diabetes.  Will work on diet and exercise.  - Hemoglobin A1c - Insulin, random  4. Vitamin D deficiency Continue OTC vitamin D.   5. Dyspepsia Continue Protonix.  6. Other sleep apnea Intensive lifestyle modifications are the first line treatment for this issue. We discussed several lifestyle modifications today and she will continue to work on diet, exercise and weight loss efforts. We will continue to monitor.  We discussed the importance of restful sleep.  7. Other hyperlipidemia Cardiovascular risk and specific lipid/LDL goals reviewed.  We discussed several lifestyle modifications today and Jenna Chavez will continue to work on diet, exercise and  weight loss efforts. Orders and follow up as documented in patient record.  Continue medication.   Counseling Intensive lifestyle modifications are the first line treatment for this issue. Dietary changes: Increase soluble fiber. Decrease simple carbohydrates. Exercise changes: Moderate to vigorous-intensity aerobic activity 150 minutes per week if tolerated. Lipid-lowering medications: see documented in medical record.  - Lipid Panel With LDL/HDL Ratio  8. Essential hypertension Jenna Chavez is working on healthy weight loss and exercise to improve blood pressure control. We will watch for signs of hypotension as she continues her lifestyle modifications. Continue medications.  9. Depression screen Depression screen is negative.  10. At risk of diabetes mellitus Jenna Chavez was given approximately 15 minutes of diabetes education and counseling today. We discussed intensive lifestyle modifications today with an emphasis on weight loss as well as increasing exercise and decreasing simple carbohydrates in her diet. We also reviewed medication options with an emphasis on risk versus benefit of those discussed.   Repetitive spaced learning was employed today to elicit superior memory formation and behavioral change.   11. Class 2 severe obesity with serious comorbidity and body mass index (BMI) of 36.0  to 36.9 in adult, unspecified obesity type [facility])  Jenna Chavez is currently in the action stage of change and her goal is to continue with weight loss efforts. I recommend Jenna Chavez begin the structured treatment plan as follows:  She has agreed to the Category 2 Plan.  She will work on meal planning and stopping all sugary drinks.  Reviewed labs from 05/05/2021, including CMP, CBC, glucose, and states she had vitamin D in July in the 30s.  Exercise goals: No exercise has been prescribed at this time.   Behavioral modification strategies: increasing lean protein intake, decreasing simple carbohydrates,  increasing vegetables, increasing water intake, decreasing eating out, no skipping meals, meal planning and cooking strategies, keeping healthy foods in the home, and planning for success.  She was informed of the importance of frequent follow-up visits to maximize her success with intensive lifestyle modifications for her multiple health conditions. She was informed we would discuss her lab results at her next visit unless there is a critical issue that needs to be addressed sooner. Jenna Chavez agreed to keep her next visit at the agreed upon time to discuss these results.  Objective:   Blood pressure 130/82, pulse 63, temperature 97.8 F (36.6 C), height '5\' 6"'$  (1.676 m), weight 223 lb (101.2 kg), SpO2 98 %. Body mass index is 35.99 kg/m.  EKG: Performed on 05/07/2021.  Indirect Calorimeter completed today shows a VO2 of 238 and a REE of 1642.  Her calculated basal metabolic rate is [id number] thus her basal metabolic rate is worse than expected.  General: Cooperative, alert, well developed, in no acute distress. HEENT: Conjunctivae and lids unremarkable. Cardiovascular: Regular rhythm.  Lungs: Normal work of breathing. Neurologic: No focal deficits.   Lab Results  Component Value Date   CREATININE 0.98 05/05/2021   BUN 12 05/05/2021   NA 136 05/05/2021   K 3.9 05/05/2021   CL 103 05/05/2021   CO2 24 05/05/2021   Lab Results  Component Value Date   ALT 22 05/05/2021   AST 19 05/05/2021   ALKPHOS 66 05/05/2021   BILITOT 0.5 05/05/2021   Lab Results  Component Value Date   HGBA1C 6.1 (H) 02/07/2020   Lab Results  Component Value Date   INSULIN 25.4 (H) 02/07/2020   Lab Results  Component Value Date   TSH 0.625 02/07/2020   Lab Results  Component Value Date   CHOL 243 (H) 02/07/2020   HDL 86 02/07/2020   LDLCALC 139 (H) 02/07/2020   TRIG 106 02/07/2020   Lab Results  Component Value Date   WBC 7.7 05/05/2021   HGB 14.7 05/05/2021   HCT 45.3 05/05/2021   MCV 89.9  05/05/2021   PLT 340 05/05/2021   Attestation Statements:   Reviewed by clinician on day of visit: allergies, medications, problem list, medical history, surgical history, family history, social history, and previous encounter notes.  I, Water quality scientist, CMA, am acting as Location manager for [employer], DO  I have reviewed the above documentation for accuracy and completeness, and I agree with the above. Jearld Lesch, DO

## 2021-07-04 LAB — LIPID PANEL WITH LDL/HDL RATIO
Cholesterol, Total: 216 mg/dL — ABNORMAL HIGH (ref 100–199)
HDL: 73 mg/dL (ref >39)
LDL Chol Calc (NIH): 125 mg/dL — ABNORMAL HIGH (ref 0–99)
LDL/HDL Ratio: 1.7 ratio (ref 0.0–3.2)
Triglycerides: 103 mg/dL (ref 0–149)
VLDL Cholesterol Cal: 18 mg/dL (ref 5–40)

## 2021-07-04 LAB — TSH: TSH: 0.839 u[IU]/mL (ref 0.450–4.500)

## 2021-07-04 LAB — HEMOGLOBIN A1C
Est. average glucose Bld gHb Est-mCnc: 151 mg/dL
Hgb A1c MFr Bld: 6.9 % — ABNORMAL HIGH (ref 4.8–5.6)

## 2021-07-04 LAB — T4, FREE: Free T4: 1.12 ng/dL (ref 0.82–1.77)

## 2021-07-04 LAB — INSULIN, RANDOM: INSULIN: 27.2 u[IU]/mL — ABNORMAL HIGH (ref 2.6–24.9)

## 2021-07-04 LAB — T3: T3, Total: 159 ng/dL (ref 71–180)

## 2021-07-05 ENCOUNTER — Encounter (INDEPENDENT_AMBULATORY_CARE_PROVIDER_SITE_OTHER): Payer: Self-pay | Admitting: Bariatrics

## 2021-07-05 DIAGNOSIS — E1169 Type 2 diabetes mellitus with other specified complication: Secondary | ICD-10-CM | POA: Insufficient documentation

## 2021-07-17 ENCOUNTER — Other Ambulatory Visit: Payer: Self-pay

## 2021-07-17 ENCOUNTER — Ambulatory Visit (INDEPENDENT_AMBULATORY_CARE_PROVIDER_SITE_OTHER): Payer: BC Managed Care – PPO | Admitting: Bariatrics

## 2021-07-17 ENCOUNTER — Encounter (INDEPENDENT_AMBULATORY_CARE_PROVIDER_SITE_OTHER): Payer: Self-pay | Admitting: Bariatrics

## 2021-07-17 VITALS — BP 142/81 | HR 70 | Temp 98.5°F | Ht 66.0 in | Wt 221.0 lb

## 2021-07-17 DIAGNOSIS — E7849 Other hyperlipidemia: Secondary | ICD-10-CM | POA: Diagnosis not present

## 2021-07-17 DIAGNOSIS — Z6836 Body mass index (BMI) 36.0-36.9, adult: Secondary | ICD-10-CM

## 2021-07-17 DIAGNOSIS — E1169 Type 2 diabetes mellitus with other specified complication: Secondary | ICD-10-CM

## 2021-07-17 DIAGNOSIS — Z9189 Other specified personal risk factors, not elsewhere classified: Secondary | ICD-10-CM | POA: Diagnosis not present

## 2021-07-17 DIAGNOSIS — E669 Obesity, unspecified: Secondary | ICD-10-CM

## 2021-07-17 NOTE — Progress Notes (Signed)
Chief Complaint:   OBESITY Jenna Chavez is here to discuss her progress with her obesity treatment plan along with follow-up of her obesity related diagnoses. [name] is on the Category 2 Plan and states she is following her eating plan approximately 70% of the time. [name] states she is walking for 30 minutes 4 times per week.  Today's visit was #: 3 Starting weight: 223 lbs Starting date: 07/03/2021 Today's weight: 221 lbs Today's date: 07/17/2021 Total lbs lost to date: 2 lbs Total lbs lost since last in-office visit: 2 lbs  Interim History: [name] is down 2 lbs since her initial visit. "It went okay". She still skipped some meals. She is about 1 lb of water per the bioimpedance scale.   Subjective:   1. Diabetes mellitus type 2 in obese [name]) Jenna Chavez is currently not on medications. She declines medication. Her last A1C level was 6.9. Her Insulin resistance level was 27.2. She was diagnosed in 2008 with diabetes type 2.  2. Other hyperlipidemia Jenna Chavez is taking Pravachol currently.  3. At risk for hypoglycemia Jenna Chavez is at risk for hypoglycemia due to dietary change and diabetes type 2.  Assessment/Plan:   1. Diabetes mellitus type 2 in obese [facility]) Rital will decrease carbohydrates. She will increase healthy protein and fats. Good blood sugar control is important to decrease the likelihood of diabetic complications such as nephropathy, neuropathy, limb loss, blindness, coronary artery disease, and death. Intensive lifestyle modification including diet, exercise and weight loss are the first line of treatment for diabetes.   2. Other hyperlipidemia Cardiovascular risk and specific lipid/LDL goals reviewed.  We discussed several lifestyle modifications today and Randee will continue to work on diet, exercise and weight loss efforts.  Latreece will continue Pravachol (not at goal). She will discuss with her PCP. Orders and follow up as documented in patient record.    Counseling Intensive lifestyle modifications are the first line treatment for this issue. Dietary changes: Increase soluble fiber. Decrease simple carbohydrates. Exercise changes: Moderate to vigorous-intensity aerobic activity 150 minutes per week if tolerated. Lipid-lowering medications: see documented in medical record.   3. At risk for hypoglycemia [name] was given approximately 15 minutes of counseling today regarding prevention of hypoglycemia. She was advised of symptoms of hypoglycemia. [name] was instructed to avoid skipping meals, eat regular protein rich meals and schedule low calorie snacks as needed.   Repetitive spaced learning was employed today to elicit superior memory formation and behavioral change   4. Obesity, current BMI 35.7 Jenna Chavez is currently in the action stage of change. As such, her goal is to continue with weight loss efforts. She has agreed to the Category 2 Plan.   Jenna Chavez will continue meal planning and intentional eating. We will review labs from 07/03/2021. She will increase protein.   Exercise goals:  Jenna Chavez will walk 30 minutes 4 times per week.  Behavioral modification strategies: increasing lean protein intake, decreasing simple carbohydrates, increasing vegetables, increasing water intake, decreasing eating out, no skipping meals, meal planning and cooking strategies, keeping healthy foods in the home, and planning for success.  Jenna Chavez has agreed to follow-up with our clinic in 2 weeks. She was informed of the importance of frequent follow-up visits to maximize her success with intensive lifestyle modifications for her multiple health conditions.   Objective:   Blood pressure (!) 142/81, pulse 70, temperature 98.5 F (36.9 C), height 5\' 6"  (1.676 m), weight 221 lb (100.2 kg), SpO2 99 %. Body mass index is 35.67 kg/m.  General: Cooperative, alert, well developed, in no acute distress. HEENT: Conjunctivae and lids unremarkable. Cardiovascular:  Regular rhythm.  Lungs: Normal work of breathing. Neurologic: No focal deficits.   Lab Results  Component Value Date   CREATININE 0.98 05/05/2021   BUN 12 05/05/2021   NA 136 05/05/2021   K 3.9 05/05/2021   CL 103 05/05/2021   CO2 24 05/05/2021   Lab Results  Component Value Date   ALT 22 05/05/2021   AST 19 05/05/2021   ALKPHOS 66 05/05/2021   BILITOT 0.5 05/05/2021   Lab Results  Component Value Date   HGBA1C 6.9 (H) 07/03/2021   HGBA1C 6.1 (H) 02/07/2020   Lab Results  Component Value Date   INSULIN 27.2 (H) 07/03/2021   INSULIN 25.4 (H) 02/07/2020   Lab Results  Component Value Date   TSH 0.839 07/03/2021   Lab Results  Component Value Date   CHOL 216 (H) 07/03/2021   HDL 73 07/03/2021   LDLCALC 125 (H) 07/03/2021   TRIG 103 07/03/2021   Lab Results  Component Value Date   VD25OH 11.1 (L) 02/10/2020   VD25OH 12.0 (L) 02/07/2020   Lab Results  Component Value Date   WBC 7.7 05/05/2021   HGB 14.7 05/05/2021   HCT 45.3 05/05/2021   MCV 89.9 05/05/2021   PLT 340 05/05/2021   No results found for: IRON, TIBC, FERRITIN  Attestation Statements:   Reviewed by clinician on day of visit: allergies, medications, problem list, medical history, surgical history, family history, social history, and previous encounter notes.  I, Lizbeth Bark, RMA, am acting as Location manager for [employer], DO.   I have reviewed the above documentation for accuracy and completeness, and I agree with the above. Jearld Lesch, DO

## 2021-07-18 ENCOUNTER — Encounter (INDEPENDENT_AMBULATORY_CARE_PROVIDER_SITE_OTHER): Payer: Self-pay | Admitting: Bariatrics

## 2021-07-29 ENCOUNTER — Other Ambulatory Visit: Payer: Self-pay | Admitting: Cardiology

## 2021-08-01 ENCOUNTER — Encounter (INDEPENDENT_AMBULATORY_CARE_PROVIDER_SITE_OTHER): Payer: Self-pay | Admitting: Bariatrics

## 2021-08-01 ENCOUNTER — Other Ambulatory Visit: Payer: Self-pay

## 2021-08-01 ENCOUNTER — Ambulatory Visit (INDEPENDENT_AMBULATORY_CARE_PROVIDER_SITE_OTHER): Payer: BC Managed Care – PPO | Admitting: Bariatrics

## 2021-08-01 VITALS — BP 144/76 | HR 66 | Temp 98.2°F | Ht 66.0 in | Wt 217.0 lb

## 2021-08-01 DIAGNOSIS — E669 Obesity, unspecified: Secondary | ICD-10-CM

## 2021-08-01 DIAGNOSIS — I10 Essential (primary) hypertension: Secondary | ICD-10-CM | POA: Diagnosis not present

## 2021-08-01 DIAGNOSIS — Z6835 Body mass index (BMI) 35.0-35.9, adult: Secondary | ICD-10-CM

## 2021-08-01 DIAGNOSIS — E1169 Type 2 diabetes mellitus with other specified complication: Secondary | ICD-10-CM | POA: Diagnosis not present

## 2021-08-01 NOTE — Progress Notes (Signed)
Chief Complaint:   OBESITY Jenna Chavez is here to discuss her progress with her obesity treatment plan along with follow-up of her obesity related diagnoses. Jenna Chavez is on the Category 2 Plan and states she is following her eating plan approximately 40% of the time. Jenna Chavez states she is walking 30 minutes 3 times per week.  Today's visit was #: 4 Starting weight: 223 lbs Starting date: 07/03/2021 Today's weight: 217 lbs Today's date: 08/01/2021 Total lbs lost to date: 6 lbs Total lbs lost since last in-office visit: 4 lbs  Interim History: Jenna Chavez is down an additional 4 lbs since her last visit. She did a "better job this time". She is adjusting to the plan.  Subjective:   1. Essential hypertension Jenna Chavez is taking Zestril and Bystolic currently. Her blood pressure is controlled.   2. Diabetes mellitus type 2 in obese Jenna Chavez) Jenna Chavez declines medications for diarrhea. Her fasting blood sugar was in the 120's range.   Assessment/Plan:   1. Essential hypertension Jenna Chavez will continue medications. She is working on healthy weight loss and exercise to improve blood pressure control. We will watch for signs of hypotension as she continues her lifestyle modifications.  2. Diabetes mellitus type 2 in obese Jenna Chavez) Jenna Chavez will decrease carbohydrates and she will increase protein and healthy fats. She will continue to exercise. Good blood sugar control is important to decrease the likelihood of diabetic complications such as nephropathy, neuropathy, limb loss, blindness, coronary artery disease, and death. Intensive lifestyle modification including diet, exercise and weight loss are the first line of treatment for diabetes.    3. Obesity with current BMI of 35.1 Jenna Chavez is currently in the action stage of change. As such, her goal is to continue with weight loss efforts. She has agreed to the Category 2 Plan.   Jenna Chavez will continue meal planning and intentional eating.   Exercise goals:  As  is.  Behavioral modification strategies: increasing lean protein intake, decreasing simple carbohydrates, increasing vegetables, increasing water intake, decreasing eating out, no skipping meals, meal planning and cooking strategies, keeping healthy foods in the home, and planning for success.  Jenna Chavez has agreed to follow-up with our clinic in 2 weeks. She was informed of the importance of frequent follow-up visits to maximize her success with intensive lifestyle modifications for her multiple health conditions.   Objective:   Blood pressure (!) 144/76, pulse 66, temperature 98.2 F (36.8 C), height 5\' 6"  (1.676 m), weight 217 lb (98.4 kg), SpO2 99 %. Body mass index is 35.02 kg/m.  General: Cooperative, alert, well developed, in no acute distress. HEENT: Conjunctivae and lids unremarkable. Cardiovascular: Regular rhythm.  Lungs: Normal work of breathing. Neurologic: No focal deficits.   Lab Results  Component Value Date   CREATININE 0.98 05/05/2021   BUN 12 05/05/2021   NA 136 05/05/2021   K 3.9 05/05/2021   CL 103 05/05/2021   CO2 24 05/05/2021   Lab Results  Component Value Date   ALT 22 05/05/2021   AST 19 05/05/2021   ALKPHOS 66 05/05/2021   BILITOT 0.5 05/05/2021   Lab Results  Component Value Date   HGBA1C 6.9 (H) 07/03/2021   HGBA1C 6.1 (H) 02/07/2020   Lab Results  Component Value Date   INSULIN 27.2 (H) 07/03/2021   INSULIN 25.4 (H) 02/07/2020   Lab Results  Component Value Date   TSH 0.839 07/03/2021   Lab Results  Component Value Date   CHOL 216 (H) 07/03/2021   HDL 73  07/03/2021   LDLCALC 125 (H) 07/03/2021   TRIG 103 07/03/2021   Lab Results  Component Value Date   VD25OH 11.1 (L) 02/10/2020   VD25OH 12.0 (L) 02/07/2020   Lab Results  Component Value Date   WBC 7.7 05/05/2021   HGB 14.7 05/05/2021   HCT 45.3 05/05/2021   MCV 89.9 05/05/2021   PLT 340 05/05/2021   No results found for: IRON, TIBC, FERRITIN  Attestation Statements:    Reviewed by clinician on day of visit: allergies, medications, problem list, medical history, surgical history, family history, social history, and previous encounter notes.  I, Lizbeth Bark, RMA, am acting as Location manager for [employer], DO.   I have reviewed the above documentation for accuracy and completeness, and I agree with the above. Jearld Lesch, DO

## 2021-08-02 ENCOUNTER — Encounter (INDEPENDENT_AMBULATORY_CARE_PROVIDER_SITE_OTHER): Payer: Self-pay | Admitting: Bariatrics

## 2021-08-21 ENCOUNTER — Encounter (INDEPENDENT_AMBULATORY_CARE_PROVIDER_SITE_OTHER): Payer: Self-pay | Admitting: Bariatrics

## 2021-08-21 ENCOUNTER — Other Ambulatory Visit: Payer: Self-pay

## 2021-08-21 ENCOUNTER — Ambulatory Visit (INDEPENDENT_AMBULATORY_CARE_PROVIDER_SITE_OTHER): Payer: BC Managed Care – PPO | Admitting: Bariatrics

## 2021-08-21 VITALS — BP 142/80 | HR 67 | Temp 98.4°F | Ht 66.0 in | Wt 218.0 lb

## 2021-08-21 DIAGNOSIS — E7849 Other hyperlipidemia: Secondary | ICD-10-CM | POA: Diagnosis not present

## 2021-08-21 DIAGNOSIS — E669 Obesity, unspecified: Secondary | ICD-10-CM | POA: Diagnosis not present

## 2021-08-21 DIAGNOSIS — Z6834 Body mass index (BMI) 34.0-34.9, adult: Secondary | ICD-10-CM | POA: Diagnosis not present

## 2021-08-21 DIAGNOSIS — E559 Vitamin D deficiency, unspecified: Secondary | ICD-10-CM

## 2021-08-22 ENCOUNTER — Encounter (INDEPENDENT_AMBULATORY_CARE_PROVIDER_SITE_OTHER): Payer: Self-pay | Admitting: Bariatrics

## 2021-08-22 NOTE — Progress Notes (Signed)
Chief Complaint:   OBESITY Jenna Chavez is here to discuss her progress with her obesity treatment plan along with follow-up of her obesity related diagnoses. Jenna Chavez is on the Category 2 Plan and states she is following her eating plan approximately 25% of the time. Jenna Chavez states she is walking for 30 minutes 3 times per week.  Today's visit was #: 5 Starting weight: 223 lbs Starting date: 07/03/2021 Today's weight: 218 lbs Today's date: 08/21/2021 Total lbs lost to date: 5 lbs Total lbs lost since last in-office visit: 0  Interim History: Jenna Chavez is up 1 lb since her last visit. She has been very busy.   Subjective:   1. Other hyperlipidemia Jenna Chavez is taking Pravachol currently.   2. Vitamin D deficiency Jenna Chavez is currently taking Vitamin D.   Assessment/Plan:   1. Other hyperlipidemia Cardiovascular risk and specific lipid/LDL goals reviewed.  We discussed several lifestyle modifications today and Taiwana will continue to work on diet, exercise and weight loss efforts. Jenna Chavez will continue taking Pravachol. Orders and follow up as documented in patient record.   Counseling Intensive lifestyle modifications are the first line treatment for this issue. Dietary changes: Increase soluble fiber. Decrease simple carbohydrates. Exercise changes: Moderate to vigorous-intensity aerobic activity 150 minutes per week if tolerated. Lipid-lowering medications: see documented in medical record.   2. Vitamin D deficiency Low Vitamin D level contributes to fatigue and are associated with obesity, breast, and colon cancer. Jenna Chavez agrees to continue to take prescription Vitamin D 2,000 IU every week and she will follow-up for routine testing of Vitamin D, at least 2-3 times per year to avoid over-replacement.   3. Class 1 obesity with serious comorbidity and body mass index (BMI) of 34.0 to 34.9 in adult, unspecified obesity type Jenna Chavez is currently in the action stage of change. As such, her  goal is to continue with weight loss efforts. She has agreed to the Category 2 Plan.   Jenna Chavez will adhere closely to the plan at 80-90%. She will be mindful eating. She will increase protein. Tips for meal planning was discussed today. She will journal.   Exercise goals:  As is.  Behavioral modification strategies: increasing lean protein intake, decreasing simple carbohydrates, increasing vegetables, increasing water intake, decreasing eating out, no skipping meals, meal planning and cooking strategies, keeping healthy foods in the home, and planning for success.  Jenna Chavez has agreed to follow-up with our clinic in 2 weeks. She was informed of the importance of frequent follow-up visits to maximize her success with intensive lifestyle modifications for her multiple health conditions.   Objective:   Blood pressure (!) 142/80, pulse 67, temperature 98.4 F (36.9 C), height 5\' 6"  (1.676 m), weight 218 lb (98.9 kg), SpO2 99 %. Body mass index is 35.19 kg/m.  General: Cooperative, alert, well developed, in no acute distress. HEENT: Conjunctivae and lids unremarkable. Cardiovascular: Regular rhythm.  Lungs: Normal work of breathing. Neurologic: No focal deficits.   Lab Results  Component Value Date   CREATININE 0.98 05/05/2021   BUN 12 05/05/2021   NA 136 05/05/2021   K 3.9 05/05/2021   CL 103 05/05/2021   CO2 24 05/05/2021   Lab Results  Component Value Date   ALT 22 05/05/2021   AST 19 05/05/2021   ALKPHOS 66 05/05/2021   BILITOT 0.5 05/05/2021   Lab Results  Component Value Date   HGBA1C 6.9 (H) 07/03/2021   HGBA1C 6.1 (H) 02/07/2020   Lab Results  Component Value Date  INSULIN 27.2 (H) 07/03/2021   INSULIN 25.4 (H) 02/07/2020   Lab Results  Component Value Date   TSH 0.839 07/03/2021   Lab Results  Component Value Date   CHOL 216 (H) 07/03/2021   HDL 73 07/03/2021   LDLCALC 125 (H) 07/03/2021   TRIG 103 07/03/2021   Lab Results  Component Value Date    VD25OH 11.1 (L) 02/10/2020   VD25OH 12.0 (L) 02/07/2020   Lab Results  Component Value Date   WBC 7.7 05/05/2021   HGB 14.7 05/05/2021   HCT 45.3 05/05/2021   MCV 89.9 05/05/2021   PLT 340 05/05/2021   No results found for: IRON, TIBC, FERRITIN  Attestation Statements:   Reviewed by clinician on day of visit: allergies, medications, problem list, medical history, surgical history, family history, social history, and previous encounter notes.  I, Lizbeth Bark, RMA, am acting as Location manager for [employer], DO.   I have reviewed the above documentation for accuracy and completeness, and I agree with the above. Jearld Lesch, DO

## 2021-09-05 ENCOUNTER — Encounter (INDEPENDENT_AMBULATORY_CARE_PROVIDER_SITE_OTHER): Payer: Self-pay | Admitting: Bariatrics

## 2021-09-05 ENCOUNTER — Ambulatory Visit (INDEPENDENT_AMBULATORY_CARE_PROVIDER_SITE_OTHER): Payer: BC Managed Care – PPO | Admitting: Bariatrics

## 2021-09-05 ENCOUNTER — Other Ambulatory Visit: Payer: Self-pay

## 2021-09-05 VITALS — BP 148/86 | HR 66 | Temp 98.7°F | Ht 66.0 in | Wt 216.0 lb

## 2021-09-05 DIAGNOSIS — E66812 Obesity, class 2: Secondary | ICD-10-CM

## 2021-09-05 DIAGNOSIS — Z6835 Body mass index (BMI) 35.0-35.9, adult: Secondary | ICD-10-CM

## 2021-09-05 DIAGNOSIS — E1169 Type 2 diabetes mellitus with other specified complication: Secondary | ICD-10-CM | POA: Diagnosis not present

## 2021-09-05 DIAGNOSIS — E559 Vitamin D deficiency, unspecified: Secondary | ICD-10-CM | POA: Diagnosis not present

## 2021-09-05 DIAGNOSIS — E669 Obesity, unspecified: Secondary | ICD-10-CM | POA: Diagnosis not present

## 2021-09-05 NOTE — Progress Notes (Signed)
Chief Complaint:   OBESITY Jenna Chavez is here to discuss her progress with her obesity treatment plan along with follow-up of her obesity related diagnoses. Jenna Chavez is on the Category 2 Plan and states she is following her eating plan approximately 10% of the time. Jenna Chavez states she is doing 0 minutes 0 times per week.  Today's visit was #: 6 Starting weight: 223 lbs Starting date: 07/03/2021 Today's weight: 216 lbs Today's date: 09/05/2021 Total lbs lost to date: 7 lbs Total lbs lost since last in-office visit: 2 lbs  Interim History: Kimeka is down 2 lbs since her last visit. She did okay with her protein and water.  Subjective:   1. Vitamin D deficiency Jenna Chavez is currently taking Vitamin D.  2. Diabetes mellitus type 2 in obese [facility]) Jenna Chavez is not on medications currently.  Her last A1C level was 6.9.  Assessment/Plan:   1. Vitamin D deficiency Low Vitamin D level contributes to fatigue and are associated with obesity, breast, and colon cancer. Shaley agrees to continue to take prescription Vitamin D 50,000 IU every week and she will follow-up for routine testing of Vitamin D, at least 2-3 times per year to avoid over-replacement.  2. Diabetes mellitus type 2 in obese [facility]) Jenna Chavez will keep carbohydrates low. She will be more active. Good blood sugar control is important to decrease the likelihood of diabetic complications such as nephropathy, neuropathy, limb loss, blindness, coronary artery disease, and death. Intensive lifestyle modification including diet, exercise and weight loss are the first line of treatment for diabetes.   3. Class 2 severe obesity with serious comorbidity and body mass index (BMI) of 35.0 to 35.9 in adult, unspecified obesity type [facility]) Jenna Chavez is currently in the action stage of change. As such, her goal is to continue with weight loss efforts. She has agreed to the Category 2 Plan.   Jenna Chavez will continue meal planning and she will continue  intentional eating. Strategies for the holidays were provided.  Exercise goals: No exercise has been prescribed at this time.  Behavioral modification strategies: increasing lean protein intake, decreasing simple carbohydrates, increasing vegetables, increasing water intake, decreasing eating out, no skipping meals, meal planning and cooking strategies, keeping healthy foods in the home, and planning for success.  Jenna Chavez has agreed to follow-up with our clinic in 3 weeks with Jake Bathe, FNP or Abby Potash, PA-C and 6 weeks with myself. She was informed of the importance of frequent follow-up visits to maximize her success with intensive lifestyle modifications for her multiple health conditions.   Objective:   Blood pressure (!) 148/86, pulse 66, temperature 98.7 F (37.1 C), height 5\' 6"  (1.676 m), weight 216 lb (98 kg), SpO2 100 %. Body mass index is 34.86 kg/m.  General: Cooperative, alert, well developed, in no acute distress. HEENT: Conjunctivae and lids unremarkable. Cardiovascular: Regular rhythm.  Lungs: Normal work of breathing. Neurologic: No focal deficits.   Lab Results  Component Value Date   CREATININE 0.98 05/05/2021   BUN 12 05/05/2021   NA 136 05/05/2021   K 3.9 05/05/2021   CL 103 05/05/2021   CO2 24 05/05/2021   Lab Results  Component Value Date   ALT 22 05/05/2021   AST 19 05/05/2021   ALKPHOS 66 05/05/2021   BILITOT 0.5 05/05/2021   Lab Results  Component Value Date   HGBA1C 6.9 (H) 07/03/2021   HGBA1C 6.1 (H) 02/07/2020   Lab Results  Component Value Date   INSULIN 27.2 (H) 07/03/2021  INSULIN 25.4 (H) 02/07/2020   Lab Results  Component Value Date   TSH 0.839 07/03/2021   Lab Results  Component Value Date   CHOL 216 (H) 07/03/2021   HDL 73 07/03/2021   LDLCALC 125 (H) 07/03/2021   TRIG 103 07/03/2021   Lab Results  Component Value Date   VD25OH 11.1 (L) 02/10/2020   VD25OH 12.0 (L) 02/07/2020   Lab Results  Component Value  Date   WBC 7.7 05/05/2021   HGB 14.7 05/05/2021   HCT 45.3 05/05/2021   MCV 89.9 05/05/2021   PLT 340 05/05/2021   No results found for: IRON, TIBC, FERRITIN  Attestation Statements:   Reviewed by clinician on day of visit: allergies, medications, problem list, medical history, surgical history, family history, social history, and previous encounter notes.  I, Lizbeth Bark, RMA, am acting as Location manager for [employer], DO.   I have reviewed the above documentation for accuracy and completeness, and I agree with the above. Jearld Lesch, DO

## 2021-09-06 ENCOUNTER — Encounter (INDEPENDENT_AMBULATORY_CARE_PROVIDER_SITE_OTHER): Payer: Self-pay | Admitting: Bariatrics

## 2021-09-25 ENCOUNTER — Ambulatory Visit (INDEPENDENT_AMBULATORY_CARE_PROVIDER_SITE_OTHER): Payer: BC Managed Care – PPO | Admitting: Family Medicine

## 2021-09-25 ENCOUNTER — Encounter (INDEPENDENT_AMBULATORY_CARE_PROVIDER_SITE_OTHER): Payer: Self-pay

## 2021-10-11 ENCOUNTER — Ambulatory Visit (INDEPENDENT_AMBULATORY_CARE_PROVIDER_SITE_OTHER): Payer: BC Managed Care – PPO | Admitting: Bariatrics

## 2021-10-25 ENCOUNTER — Encounter (INDEPENDENT_AMBULATORY_CARE_PROVIDER_SITE_OTHER): Payer: Self-pay | Admitting: Bariatrics

## 2021-10-25 ENCOUNTER — Ambulatory Visit (INDEPENDENT_AMBULATORY_CARE_PROVIDER_SITE_OTHER): Payer: BC Managed Care – PPO | Admitting: Bariatrics

## 2021-10-25 ENCOUNTER — Other Ambulatory Visit: Payer: Self-pay

## 2021-10-25 VITALS — BP 146/86 | HR 70 | Temp 98.1°F | Ht 66.0 in | Wt 219.0 lb

## 2021-10-25 DIAGNOSIS — E669 Obesity, unspecified: Secondary | ICD-10-CM

## 2021-10-25 DIAGNOSIS — E1169 Type 2 diabetes mellitus with other specified complication: Secondary | ICD-10-CM | POA: Diagnosis not present

## 2021-10-25 DIAGNOSIS — Z6835 Body mass index (BMI) 35.0-35.9, adult: Secondary | ICD-10-CM

## 2021-10-25 DIAGNOSIS — E559 Vitamin D deficiency, unspecified: Secondary | ICD-10-CM

## 2021-10-29 NOTE — Progress Notes (Signed)
Chief Complaint:   OBESITY Jenna Chavez is here to discuss her progress with her obesity treatment plan along with follow-up of her obesity related diagnoses. [name] is on the Category 2 Plan and states she is following her eating plan approximately 40% of the time. [name] states she is doing 0 minutes 0 times per week.  Today's visit was #: 7 Starting weight: 223 lbs Starting date: 07/03/2021 Today's weight: 219 lbs Today's date: 10/25/2021 Total lbs lost to date: 4 lbs Total lbs lost since last in-office visit: 0  Interim History: [name] is up 3 lbs since her last visit.  Subjective:   1. Vitamin D deficiency [name] is taking Vitamin D currently.  2. Diabetes mellitus type 2 in obese [facility]) [name] is not medications for diabetes. Her appetite is low.  Assessment/Plan:   1. Vitamin D deficiency Low Vitamin D level contributes to fatigue and are associated with obesity, breast, and colon cancer. [name] agrees to continue Vitamin D and she will follow-up for routine testing of Vitamin D, at least 2-3 times per year to avoid over-replacement.  2. Diabetes mellitus type 2 in obese [facility]) [name] will increase her protein and she will increase her water intake. Good blood sugar control is important to decrease the likelihood of diabetic complications such as nephropathy, neuropathy, limb loss, blindness, coronary artery disease, and death. Intensive lifestyle modification including diet, exercise and weight loss are the first line of treatment for diabetes.   3. Obesity, current BMI 35.3 [name] is currently in the action stage of change. As such, her goal is to continue with weight loss efforts. She has agreed to the Category 2 Plan.   [name] will continue meal planning and she will continue intentional eating.   Exercise goals: [name] will start walking.  Behavioral modification strategies: increasing lean protein intake, decreasing simple carbohydrates, increasing vegetables,  increasing water intake, decreasing eating out, no skipping meals, meal planning and cooking strategies, keeping healthy foods in the home, and planning for success.  [name] has agreed to follow-up with our clinic in 3 weeks. She was informed of the importance of frequent follow-up visits to maximize her success with intensive lifestyle modifications for her multiple health conditions.   Objective:   Blood pressure (!) 146/86, pulse 70, temperature 98.1 F (36.7 C), height 5\' 6"  (1.676 m), weight 219 lb (99.3 kg), SpO2 99 %. Body mass index is 35.35 kg/m.  General: Cooperative, alert, well developed, in no acute distress. HEENT: Conjunctivae and lids unremarkable. Cardiovascular: Regular rhythm.  Lungs: Normal work of breathing. Neurologic: No focal deficits.   Lab Results  Component Value Date   CREATININE 0.98 05/05/2021   BUN 12 05/05/2021   NA 136 05/05/2021   K 3.9 05/05/2021   CL 103 05/05/2021   CO2 24 05/05/2021   Lab Results  Component Value Date   ALT 22 05/05/2021   AST 19 05/05/2021   ALKPHOS 66 05/05/2021   BILITOT 0.5 05/05/2021   Lab Results  Component Value Date   HGBA1C 6.9 (H) 07/03/2021   HGBA1C 6.1 (H) 02/07/2020   Lab Results  Component Value Date   INSULIN 27.2 (H) 07/03/2021   INSULIN 25.4 (H) 02/07/2020   Lab Results  Component Value Date   TSH 0.839 07/03/2021   Lab Results  Component Value Date   CHOL 216 (H) 07/03/2021   HDL 73 07/03/2021   LDLCALC 125 (H) 07/03/2021   TRIG 103 07/03/2021   Lab Results  Component Value Date  VD25OH 11.1 (L) 02/10/2020   VD25OH 12.0 (L) 02/07/2020   Lab Results  Component Value Date   WBC 7.7 05/05/2021   HGB 14.7 05/05/2021   HCT 45.3 05/05/2021   MCV 89.9 05/05/2021   PLT 340 05/05/2021   No results found for: IRON, TIBC, FERRITIN  Attestation Statements:   Reviewed by clinician on day of visit: allergies, medications, problem list, medical history, surgical history, family history,  social history, and previous encounter notes.  I, Lizbeth Bark, RMA, am acting as Location manager for [employer], DO.  I have reviewed the above documentation for accuracy and completeness, and I agree with the above. Jearld Lesch, DO

## 2021-10-30 ENCOUNTER — Encounter (INDEPENDENT_AMBULATORY_CARE_PROVIDER_SITE_OTHER): Payer: Self-pay | Admitting: Bariatrics

## 2021-11-15 ENCOUNTER — Other Ambulatory Visit: Payer: Self-pay

## 2021-11-15 ENCOUNTER — Encounter (INDEPENDENT_AMBULATORY_CARE_PROVIDER_SITE_OTHER): Payer: Self-pay | Admitting: Bariatrics

## 2021-11-15 ENCOUNTER — Ambulatory Visit (INDEPENDENT_AMBULATORY_CARE_PROVIDER_SITE_OTHER): Payer: BC Managed Care – PPO | Admitting: Bariatrics

## 2021-11-15 VITALS — BP 147/87 | HR 73 | Temp 98.1°F | Ht 66.0 in | Wt 214.0 lb

## 2021-11-15 DIAGNOSIS — E669 Obesity, unspecified: Secondary | ICD-10-CM

## 2021-11-15 DIAGNOSIS — Z6834 Body mass index (BMI) 34.0-34.9, adult: Secondary | ICD-10-CM | POA: Diagnosis not present

## 2021-11-15 DIAGNOSIS — E559 Vitamin D deficiency, unspecified: Secondary | ICD-10-CM

## 2021-11-15 DIAGNOSIS — E1169 Type 2 diabetes mellitus with other specified complication: Secondary | ICD-10-CM

## 2021-11-15 NOTE — Progress Notes (Signed)
Chief Complaint:   OBESITY Jenna Chavez is here to discuss her progress with her obesity treatment plan along with follow-up of her obesity related diagnoses. Jenna Chavez is on the Category 2 Plan and states she is following her eating plan approximately 50% of the time. Jenna Chavez states she is doing 0 minutes 0 times per week.  Today's visit was #: 8 Starting weight: 223 lbs Starting date: 07/03/2021 Today's weight: 214 lbs Today's date: 11/15/2021 Total lbs lost to date: 9 lbs Total lbs lost since last in-office visit: 5 lbs  Interim History: Jenna Chavez is down an additional 5 lbs. She has been more consistent with her meals.  Subjective:   1. Diabetes mellitus type 2 in obese [facility]) Jenna Chavez is currently not on medications.   2. Vitamin D deficiency Jenna Chavez is taking Vitamin D currently.  Assessment/Plan:   1. Diabetes mellitus type 2 in obese Jenna [facility]) Jenna Chavez will keep carbohydrates low. She will increase healthy protein and fats. Good blood sugar control is important to decrease the likelihood of diabetic complications such as nephropathy, neuropathy, limb loss, blindness, coronary artery disease, and death. Intensive lifestyle modification including diet, exercise and weight loss are the first line of treatment for diabetes.   2. Vitamin D deficiency Low Vitamin D level contributes to fatigue and are associated with obesity, breast, and colon cancer. Jenna Chavez will continue taking Vitamin D and she will follow-up for routine testing of Vitamin D, at least 2-3 times per year to avoid over-replacement.  3. Obesity, current BMI 34.7 Jenna Chavez is currently in the action stage of change. As such, her goal is to continue with weight loss efforts. She has agreed to the Category 2 Plan.   Jenna Chavez will continue meal planning and she will continue intentional eating.   Exercise goals: No exercise has been prescribed at this time.  Behavioral modification strategies: increasing lean protein intake, decreasing  simple carbohydrates, increasing vegetables, increasing water intake, decreasing eating out, no skipping meals, meal planning and cooking strategies, keeping healthy foods in the home, and planning for success.  Jenna Chavez has agreed to follow-up with our clinic in 2-3 weeks. She was informed of the importance of frequent follow-up visits to maximize her success with intensive lifestyle modifications for her multiple health conditions.   Objective:   Blood pressure (!) 147/87, pulse 73, temperature 98.1 F (36.7 C), height 5\' 6"  (1.676 m), weight 214 lb (97.1 kg), SpO2 100 %. Body mass index is 34.54 kg/m.  General: Cooperative, alert, well developed, in no acute distress. HEENT: Conjunctivae and lids unremarkable. Cardiovascular: Regular rhythm.  Lungs: Normal work of breathing. Neurologic: No focal deficits.   Lab Results  Component Value Date   CREATININE 0.98 05/05/2021   BUN 12 05/05/2021   NA 136 05/05/2021   K 3.9 05/05/2021   CL 103 05/05/2021   CO2 24 05/05/2021   Lab Results  Component Value Date   ALT 22 05/05/2021   AST 19 05/05/2021   ALKPHOS 66 05/05/2021   BILITOT 0.5 05/05/2021   Lab Results  Component Value Date   HGBA1C 6.9 (H) 07/03/2021   HGBA1C 6.1 (H) 02/07/2020   Lab Results  Component Value Date   INSULIN 27.2 (H) 07/03/2021   INSULIN 25.4 (H) 02/07/2020   Lab Results  Component Value Date   TSH 0.839 07/03/2021   Lab Results  Component Value Date   CHOL 216 (H) 07/03/2021   HDL 73 07/03/2021   LDLCALC 125 (H) 07/03/2021   TRIG 103 07/03/2021  Lab Results  Component Value Date   VD25OH 11.1 (L) 02/10/2020   VD25OH 12.0 (L) 02/07/2020   Lab Results  Component Value Date   WBC 7.7 05/05/2021   HGB 14.7 05/05/2021   HCT 45.3 05/05/2021   MCV 89.9 05/05/2021   PLT 340 05/05/2021   No results found for: IRON, TIBC, FERRITIN  Attestation Statements:   Reviewed by clinician on day of visit: allergies, medications, problem list,  medical history, surgical history, family history, social history, and previous encounter notes.   I, Lizbeth Bark, RMA, am acting as Location manager for [employer], DO.  I have reviewed the above documentation for accuracy and completeness, and I agree with the above. Jearld Lesch, DO

## 2021-11-19 ENCOUNTER — Encounter (INDEPENDENT_AMBULATORY_CARE_PROVIDER_SITE_OTHER): Payer: Self-pay | Admitting: Bariatrics

## 2021-11-29 ENCOUNTER — Other Ambulatory Visit (HOSPITAL_COMMUNITY): Payer: Self-pay | Admitting: Internal Medicine

## 2021-11-29 DIAGNOSIS — Z1231 Encounter for screening mammogram for malignant neoplasm of breast: Secondary | ICD-10-CM

## 2021-12-03 ENCOUNTER — Ambulatory Visit (INDEPENDENT_AMBULATORY_CARE_PROVIDER_SITE_OTHER): Payer: BC Managed Care – PPO | Admitting: Bariatrics

## 2021-12-03 ENCOUNTER — Encounter (INDEPENDENT_AMBULATORY_CARE_PROVIDER_SITE_OTHER): Payer: Self-pay | Admitting: Bariatrics

## 2021-12-03 ENCOUNTER — Other Ambulatory Visit: Payer: Self-pay

## 2021-12-03 VITALS — BP 147/89 | HR 64 | Temp 98.2°F | Ht 66.0 in | Wt 213.0 lb

## 2021-12-03 DIAGNOSIS — E669 Obesity, unspecified: Secondary | ICD-10-CM

## 2021-12-03 DIAGNOSIS — E7849 Other hyperlipidemia: Secondary | ICD-10-CM | POA: Diagnosis not present

## 2021-12-03 DIAGNOSIS — E559 Vitamin D deficiency, unspecified: Secondary | ICD-10-CM

## 2021-12-03 DIAGNOSIS — Z6834 Body mass index (BMI) 34.0-34.9, adult: Secondary | ICD-10-CM

## 2021-12-04 NOTE — Progress Notes (Signed)
Chief Complaint:   OBESITY Jenna Chavez is here to discuss her progress with her obesity treatment plan along with follow-up of her obesity related diagnoses. Jenna Chavez is on the Category 2 Plan and states she is following her eating plan approximately 40% of the time. Jenna Chavez states she is walking for 30 minutes 3 times per week.  Today's visit was #: 9 Starting weight: 223 lbs Starting date: 07/03/2021 Today's weight: 213 lbs Today's date: 12/03/2021 Total lbs lost to date: 10 lbs Total lbs lost since last in-office visit: 1 lb  Interim History: Jenna Chavez is down 1 lb. She did not do as well. She is getting better with water.   Subjective:   1. Other hyperlipidemia Jenna Chavez is taking Pravachol currently.  2. Vitamin D deficiency Jenna Chavez is currently taking Vitamin D.  Assessment/Plan:   1. Other hyperlipidemia Cardiovascular risk and specific lipid/LDL goals reviewed.  Jenna Chavez will continue taking Pravachol. We discussed several lifestyle modifications today and Jenna Chavez will continue to work on diet, exercise and weight loss efforts. Orders and follow up as documented in patient record.   Counseling Intensive lifestyle modifications are the first line treatment for this issue. Dietary changes: Increase soluble fiber. Decrease simple carbohydrates. Exercise changes: Moderate to vigorous-intensity aerobic activity 150 minutes per week if tolerated. Lipid-lowering medications: see documented in medical record.  2. Vitamin D deficiency Low Vitamin D level contributes to fatigue and are associated with obesity, breast, and colon cancer. Jenna Chavez agrees to continue to take prescription Vitamin D 50,000 IU every week and she will follow-up for routine testing of Vitamin D, at least 2-3 times per year to avoid over-replacement.  3. Obesity, current BMI 34.6 Jenna Chavez is currently in the action stage of change. As such, her goal is to continue with weight loss efforts. She has agreed to the Category 2  Plan.   Jenna Chavez will continue meal planning and she will be mindful eating. She will follow the plan more closely (lunch). She will weigh her meat.   Exercise goals:  As is.  Behavioral modification strategies: increasing lean protein intake, decreasing simple carbohydrates, increasing vegetables, increasing water intake, decreasing eating out, no skipping meals, meal planning and cooking strategies, keeping healthy foods in the home, and planning for success.  Jenna Chavez has agreed to follow-up with our clinic in 3-4 weeks. She was informed of the importance of frequent follow-up visits to maximize her success with intensive lifestyle modifications for her multiple health conditions.   Objective:   Blood pressure (!) 147/89, pulse 64, temperature 98.2 F (36.8 C), height 5\' 6"  (1.676 m), weight 213 lb (96.6 kg), SpO2 100 %. Body mass index is 34.38 kg/m.  General: Cooperative, alert, well developed, in no acute distress. HEENT: Conjunctivae and lids unremarkable. Cardiovascular: Regular rhythm.  Lungs: Normal work of breathing. Neurologic: No focal deficits.   Lab Results  Component Value Date   CREATININE 0.98 05/05/2021   BUN 12 05/05/2021   NA 136 05/05/2021   K 3.9 05/05/2021   CL 103 05/05/2021   CO2 24 05/05/2021   Lab Results  Component Value Date   ALT 22 05/05/2021   AST 19 05/05/2021   ALKPHOS 66 05/05/2021   BILITOT 0.5 05/05/2021   Lab Results  Component Value Date   HGBA1C 6.9 (H) 07/03/2021   HGBA1C 6.1 (H) 02/07/2020   Lab Results  Component Value Date   INSULIN 27.2 (H) 07/03/2021   INSULIN 25.4 (H) 02/07/2020   Lab Results  Component Value Date  TSH 0.839 07/03/2021   Lab Results  Component Value Date   CHOL 216 (H) 07/03/2021   HDL 73 07/03/2021   LDLCALC 125 (H) 07/03/2021   TRIG 103 07/03/2021   Lab Results  Component Value Date   VD25OH 11.1 (L) 02/10/2020   VD25OH 12.0 (L) 02/07/2020   Lab Results  Component Value Date   WBC 7.7  05/05/2021   HGB 14.7 05/05/2021   HCT 45.3 05/05/2021   MCV 89.9 05/05/2021   PLT 340 05/05/2021   No results found for: IRON, TIBC, FERRITIN  Attestation Statements:   Reviewed by clinician on day of visit: allergies, medications, problem list, medical history, surgical history, family history, social history, and previous encounter notes.  I, Lizbeth Bark, RMA, am acting as Location manager for [employer], DO.  I have reviewed the above documentation for accuracy and completeness, and I agree with the above. Jearld Lesch, DO

## 2021-12-05 ENCOUNTER — Encounter (INDEPENDENT_AMBULATORY_CARE_PROVIDER_SITE_OTHER): Payer: Self-pay | Admitting: Bariatrics

## 2021-12-19 ENCOUNTER — Encounter: Payer: Self-pay | Admitting: Internal Medicine

## 2021-12-31 ENCOUNTER — Encounter (INDEPENDENT_AMBULATORY_CARE_PROVIDER_SITE_OTHER): Payer: Self-pay | Admitting: Bariatrics

## 2021-12-31 ENCOUNTER — Ambulatory Visit (INDEPENDENT_AMBULATORY_CARE_PROVIDER_SITE_OTHER): Payer: BC Managed Care – PPO | Admitting: Bariatrics

## 2021-12-31 ENCOUNTER — Other Ambulatory Visit: Payer: Self-pay

## 2021-12-31 VITALS — BP 148/76 | HR 75 | Temp 97.7°F | Ht 66.0 in | Wt 211.0 lb

## 2021-12-31 DIAGNOSIS — E559 Vitamin D deficiency, unspecified: Secondary | ICD-10-CM

## 2021-12-31 DIAGNOSIS — E785 Hyperlipidemia, unspecified: Secondary | ICD-10-CM

## 2021-12-31 DIAGNOSIS — E1169 Type 2 diabetes mellitus with other specified complication: Secondary | ICD-10-CM

## 2021-12-31 DIAGNOSIS — I10 Essential (primary) hypertension: Secondary | ICD-10-CM | POA: Diagnosis not present

## 2021-12-31 DIAGNOSIS — E669 Obesity, unspecified: Secondary | ICD-10-CM

## 2021-12-31 DIAGNOSIS — Z6834 Body mass index (BMI) 34.0-34.9, adult: Secondary | ICD-10-CM

## 2022-01-01 LAB — INSULIN, RANDOM: INSULIN: 19.8 u[IU]/mL (ref 2.6–24.9)

## 2022-01-01 LAB — COMPREHENSIVE METABOLIC PANEL
ALT: 11 IU/L (ref 0–32)
AST: 15 IU/L (ref 0–40)
Albumin/Globulin Ratio: 1.8 (ref 1.2–2.2)
Albumin: 4.4 g/dL (ref 3.8–4.9)
Alkaline Phosphatase: 63 IU/L (ref 44–121)
BUN/Creatinine Ratio: 15 (ref 9–23)
BUN: 16 mg/dL (ref 6–24)
Bilirubin Total: 0.5 mg/dL (ref 0.0–1.2)
CO2: 23 mmol/L (ref 20–29)
Calcium: 9.7 mg/dL (ref 8.7–10.2)
Chloride: 106 mmol/L (ref 96–106)
Creatinine, Ser: 1.08 mg/dL — ABNORMAL HIGH (ref 0.57–1.00)
Globulin, Total: 2.5 g/dL (ref 1.5–4.5)
Glucose: 94 mg/dL (ref 70–99)
Potassium: 3.9 mmol/L (ref 3.5–5.2)
Sodium: 142 mmol/L (ref 134–144)
Total Protein: 6.9 g/dL (ref 6.0–8.5)
eGFR: 61 mL/min/{1.73_m2} (ref >59)

## 2022-01-01 LAB — HEMOGLOBIN A1C
Est. average glucose Bld gHb Est-mCnc: 131 mg/dL
Hgb A1c MFr Bld: 6.2 % — ABNORMAL HIGH (ref 4.8–5.6)

## 2022-01-01 LAB — LIPID PANEL WITH LDL/HDL RATIO
Cholesterol, Total: 229 mg/dL — ABNORMAL HIGH (ref 100–199)
HDL: 77 mg/dL (ref >39)
LDL Chol Calc (NIH): 135 mg/dL — ABNORMAL HIGH (ref 0–99)
LDL/HDL Ratio: 1.8 ratio (ref 0.0–3.2)
Triglycerides: 99 mg/dL (ref 0–149)
VLDL Cholesterol Cal: 17 mg/dL (ref 5–40)

## 2022-01-01 LAB — VITAMIN D 25 HYDROXY (VIT D DEFICIENCY, FRACTURES): Vit D, 25-Hydroxy: 45.1 ng/mL (ref 30.0–100.0)

## 2022-01-01 NOTE — Progress Notes (Signed)
? ? ? ?Chief Complaint:  ? ?OBESITY ?Jenna Chavez is here to discuss her progress with her obesity treatment plan along with follow-up of her obesity related diagnoses. Jenna Chavez is on the Category 2 Plan and states she is following her eating plan approximately 30% of the time. Jenna Chavez states she is walking for 30 minutes 4 times per week. ? ?Today's visit was #: 10 ?Starting weight: 223 lbs ?Starting date: 07/03/2021 ?Today's weight: 211 lbs ?Today's date: 12/31/2021 ?Total lbs lost to date: 12 lbs ?Total lbs lost since last in-office visit: 2 lbs ? ?Interim History: Jenna Chavez is down an additional 2 lbs since her last visit. She is getting more water and protein. She is doing better with her breakfast.  ? ?Subjective:  ? ?1. Essential hypertension ?Jenna Chavez is currently taking Zestril and Bystolic. Her blood pressure is reasonably well controlled. Her last blood pressure was 147/89. ? ?2. Vitamin D deficiency ?Jenna Chavez is taking Vitamin D currently.  ? ?3. Diabetes mellitus type 2 in obese Jenna Chavez) ?Jenna Chavez's A1C was 6.2 on 12/31/2021. ? ?4. Hyperlipidemia associated with type 2 diabetes mellitus ([place]) ?Jenna Chavez is taking Pravachol currently.  ? ?Assessment/Plan:  ? ?1. Essential hypertension ?Jenna Chavez will continue taking Zestril and Bystolic. She is working on healthy weight loss and exercise to improve blood pressure control. We will check CMP today. We will watch for signs of hypotension as she continues her lifestyle modifications. ? ?- Comprehensive metabolic panel ? ?2. Vitamin D deficiency ?Low Vitamin D level contributes to fatigue and are associated with obesity, breast, and colon cancer. Jenna Chavez will continue prescription Vitamin D 50,000 IU every week for 1 month with no refills and she will follow-up for routine testing of Vitamin D, at least 2-3 times per year to avoid over-replacement. We will check Vitamin D today.  ? ?- VITAMIN D 25 Hydroxy (Vit-D Deficiency, Fractures) ? ?3. Diabetes mellitus type 2 in obese Jenna Chavez) ?We will  check A1C, insulin and CMP today. Good blood sugar control is important to decrease the likelihood of diabetic complications such as nephropathy, neuropathy, limb loss, blindness, coronary artery disease, and death. Intensive lifestyle modification including diet, exercise and weight loss are the first line of treatment for diabetes.  ? ?- Comprehensive metabolic panel ?- Insulin, random ?- Hemoglobin A1c ? ?4. Hyperlipidemia associated with type 2 diabetes mellitus ([place]) ?Cardiovascular risk and specific lipid/LDL goals reviewed.  We will check Lipid panel today. We discussed several lifestyle modifications today and Jenna Chavez will continue to work on diet, exercise and weight loss efforts. Orders and follow up as documented in patient record.  ? ?Counseling ?Intensive lifestyle modifications are the first line treatment for this issue. ?Dietary changes: Increase soluble fiber. Decrease simple carbohydrates. ?Exercise changes: Moderate to vigorous-intensity aerobic activity 150 minutes per week if tolerated. ?Lipid-lowering medications: see documented in medical record. ? ?- Lipid Panel With LDL/HDL Ratio ? ?5. Obesity, with current BMI of 34.1 ?Jenna Chavez is currently in the action stage of change. As such, her goal is to continue with weight loss efforts. She has agreed to the Category 2 Plan.  ? ?Jenna Chavez will adhere more closely to the plan 80-90%. She will continue intentional eating.  ? ?Exercise goals:  As is. ? ?Behavioral modification strategies: increasing lean protein intake, decreasing simple carbohydrates, increasing vegetables, increasing water intake, decreasing eating out, no skipping meals, meal planning and cooking strategies, keeping healthy foods in the home, and planning for success. ? ?Jenna Chavez has agreed to follow-up with our clinic in 3-4 weeks. She  was informed of the importance of frequent follow-up visits to maximize her success with intensive lifestyle modifications for her multiple health  conditions.  ? ?Jenna Chavez was informed we would discuss her lab results at her next visit unless there is a critical issue that needs to be addressed sooner. Jenna Chavez agreed to keep her next visit at the agreed upon time to discuss these results. ? ?Objective:  ? ?Blood pressure (!) 148/76, pulse 75, temperature 97.7 ?F (36.5 ?C), height '5\' 6"'$  (1.676 m), weight 211 lb (95.7 kg), SpO2 100 %. ?Body mass index is 34.06 kg/m?. ? ?General: Cooperative, alert, well developed, in no acute distress. ?HEENT: Conjunctivae and lids unremarkable. ?Cardiovascular: Regular rhythm.  ?Lungs: Normal work of breathing. ?Neurologic: No focal deficits.  ? ?Lab Results  ?Component Value Date  ? CREATININE 1.08 (H) 12/31/2021  ? BUN 16 12/31/2021  ? NA 142 12/31/2021  ? K 3.9 12/31/2021  ? CL 106 12/31/2021  ? CO2 23 12/31/2021  ? ?Lab Results  ?Component Value Date  ? ALT 11 12/31/2021  ? AST 15 12/31/2021  ? ALKPHOS 63 12/31/2021  ? BILITOT 0.5 12/31/2021  ? ?Lab Results  ?Component Value Date  ? HGBA1C 6.2 (H) 12/31/2021  ? HGBA1C 6.9 (H) 07/03/2021  ? HGBA1C 6.1 (H) 02/07/2020  ? ?Lab Results  ?Component Value Date  ? INSULIN 19.8 12/31/2021  ? INSULIN 27.2 (H) 07/03/2021  ? INSULIN 25.4 (H) 02/07/2020  ? ?Lab Results  ?Component Value Date  ? TSH 0.839 07/03/2021  ? ?Lab Results  ?Component Value Date  ? CHOL 229 (H) 12/31/2021  ? HDL 77 12/31/2021  ? LDLCALC 135 (H) 12/31/2021  ? TRIG 99 12/31/2021  ? ?Lab Results  ?Component Value Date  ? VD25OH 45.1 12/31/2021  ? VD25OH 11.1 (L) 02/10/2020  ? VD25OH 12.0 (L) 02/07/2020  ? ?Lab Results  ?Component Value Date  ? WBC 7.7 05/05/2021  ? HGB 14.7 05/05/2021  ? HCT 45.3 05/05/2021  ? MCV 89.9 05/05/2021  ? PLT 340 05/05/2021  ? ?No results found for: IRON, TIBC, FERRITIN ? ?Attestation Statements:  ? ?Reviewed by clinician on day of visit: allergies, medications, problem list, medical history, surgical history, family history, social history, and previous encounter notes. ? ?I, Lizbeth Bark,  RMA, am acting as transcriptionist for [employer], DO. ? ?I have reviewed the above documentation for accuracy and completeness, and I agree with the above. Jearld Lesch, DO ? ?

## 2022-01-02 ENCOUNTER — Encounter (INDEPENDENT_AMBULATORY_CARE_PROVIDER_SITE_OTHER): Payer: Self-pay | Admitting: Bariatrics

## 2022-01-28 ENCOUNTER — Ambulatory Visit (HOSPITAL_COMMUNITY)
Admission: RE | Admit: 2022-01-28 | Discharge: 2022-01-28 | Disposition: A | Discharge status: Home / Self Care | Payer: BC Managed Care – PPO | Source: Ambulatory Visit | Attending: Internal Medicine | Admitting: Internal Medicine

## 2022-01-28 DIAGNOSIS — Z1231 Encounter for screening mammogram for malignant neoplasm of breast: Secondary | ICD-10-CM | POA: Diagnosis present

## 2022-01-29 ENCOUNTER — Ambulatory Visit (INDEPENDENT_AMBULATORY_CARE_PROVIDER_SITE_OTHER): Payer: BC Managed Care – PPO | Admitting: Bariatrics

## 2022-01-29 ENCOUNTER — Encounter (INDEPENDENT_AMBULATORY_CARE_PROVIDER_SITE_OTHER): Payer: Self-pay | Admitting: Bariatrics

## 2022-01-29 VITALS — BP 148/86 | HR 71 | Temp 98.1°F | Ht 66.0 in | Wt 212.0 lb

## 2022-01-29 DIAGNOSIS — E1169 Type 2 diabetes mellitus with other specified complication: Secondary | ICD-10-CM | POA: Diagnosis not present

## 2022-01-29 DIAGNOSIS — K5909 Other constipation: Secondary | ICD-10-CM

## 2022-01-29 DIAGNOSIS — Z6834 Body mass index (BMI) 34.0-34.9, adult: Secondary | ICD-10-CM

## 2022-01-29 DIAGNOSIS — E669 Obesity, unspecified: Secondary | ICD-10-CM | POA: Diagnosis not present

## 2022-01-31 ENCOUNTER — Encounter (INDEPENDENT_AMBULATORY_CARE_PROVIDER_SITE_OTHER): Payer: Self-pay | Admitting: Bariatrics

## 2022-01-31 NOTE — Progress Notes (Signed)
? ? ? ?Chief Complaint:  ? ?OBESITY ?Jenna Chavez is here to discuss her progress with her obesity treatment plan along with follow-up of her obesity related diagnoses. Jenna Chavez is on the Category 2 Plan and states she is following her eating plan approximately 30% of the time. Jenna Chavez states she is walking for 30 minutes 2 times per week. ? ?Today's visit was #: 76 ?Starting weight: 223 lbs ?Starting date: 07/03/2021 ?Today's weight: 212 lbs ?Today's date: 01/29/2022 ?Total lbs lost to date: 11 lbs ?Total lbs lost since last in-office visit: 0 ? ?Interim History: Jenna Chavez is up 1 lb of water.  ? ?Subjective:  ? ?1. Other constipation ?Jenna Chavez is currently taking Miralax and Colace. She has taken Linzess before.  ? ?2. Diabetes mellitus type 2 in obese Jenna Chavez) ?Jenna Chavez A1C was 6.2. Her A1C goes up with water gain.  ? ?Assessment/Plan:  ? ?1. Other constipation ?Jenna Chavez was informed that a decrease in bowel movement frequency is normal while losing weight, but stools should not be hard or painful. Aneesa will continue the Miralax and Colace. She will use Flaxseed 1 tsp daily and she will increase her water intake. Orders and follow up as documented in patient record.  ? ?Counseling ?Getting to Good Bowel Health: Your goal is to have one soft bowel movement each day. Drink at least 8 glasses of water each day. Eat plenty of fiber (goal is over 25 grams each day). It is best to get most of your fiber from dietary sources which includes leafy green vegetables, fresh fruit, and whole grains. You may need to add fiber with the help of OTC fiber supplements. These include Metamucil, Citrucel, and Flaxseed. If you are still having trouble, try adding Miralax or Magnesium Citrate. If all of these changes do not work, Cab[employer].  ? ?2. Diabetes mellitus type 2 in obese Jenna Chavez) ?Jenna Chavez will minimize her carbohydrates (sweets and starches). Good blood sugar control is important to decrease the likelihood of diabetic complications  such as nephropathy, neuropathy, limb loss, blindness, coronary artery disease, and death. Intensive lifestyle modification including diet, exercise and weight loss are the first line of treatment for diabetes.  ? ?3. Obesity, current BMI 34.2 ?Jenna Chavez is currently in the action stage of change. As such, her goal is to continue with weight loss efforts. She has agreed to the Category 2 Plan.  ? ?We reviewed labs from 12/31/2021 CMP, Lipids, Vitamin D, A1C, and insulin.  ? ?Exercise goals:  As is. ? ?Behavioral modification strategies: increasing lean protein intake, decreasing simple carbohydrates, increasing vegetables, increasing water intake, decreasing eating out, no skipping meals, meal planning and cooking strategies, keeping healthy foods in the home, and planning for success. ? ?Jenna Chavez has agreed to follow-up with our clinic in 4-5 weeks. She was informed of the importance of frequent follow-up visits to maximize her success with intensive lifestyle modifications for her multiple health conditions.  ? ?Objective:  ? ?Blood pressure (!) 148/86, pulse 71, temperature 98.1 ?F (36.7 ?C), height '5\' 6"'$  (1.676 m), weight 212 lb (96.2 kg), SpO2 100 %. ?Body mass index is 34.22 kg/m?. ? ?General: Cooperative, alert, well developed, in no acute distress. ?HEENT: Conjunctivae and lids unremarkable. ?Cardiovascular: Regular rhythm.  ?Lungs: Normal work of breathing. ?Neurologic: No focal deficits.  ? ?Lab Results  ?Component Value Date  ? CREATININE 1.08 (H) 12/31/2021  ? BUN 16 12/31/2021  ? NA 142 12/31/2021  ? K 3.9 12/31/2021  ? CL 106 12/31/2021  ? CO2 23  12/31/2021  ? ?Lab Results  ?Component Value Date  ? ALT 11 12/31/2021  ? AST 15 12/31/2021  ? ALKPHOS 63 12/31/2021  ? BILITOT 0.5 12/31/2021  ? ?Lab Results  ?Component Value Date  ? HGBA1C 6.2 (H) 12/31/2021  ? HGBA1C 6.9 (H) 07/03/2021  ? HGBA1C 6.1 (H) 02/07/2020  ? ?Lab Results  ?Component Value Date  ? INSULIN 19.8 12/31/2021  ? INSULIN 27.2 (H) 07/03/2021  ?  INSULIN 25.4 (H) 02/07/2020  ? ?Lab Results  ?Component Value Date  ? TSH 0.839 07/03/2021  ? ?Lab Results  ?Component Value Date  ? CHOL 229 (H) 12/31/2021  ? HDL 77 12/31/2021  ? LDLCALC 135 (H) 12/31/2021  ? TRIG 99 12/31/2021  ? ?Lab Results  ?Component Value Date  ? VD25OH 45.1 12/31/2021  ? VD25OH 11.1 (L) 02/10/2020  ? VD25OH 12.0 (L) 02/07/2020  ? ?Lab Results  ?Component Value Date  ? WBC 7.7 05/05/2021  ? HGB 14.7 05/05/2021  ? HCT 45.3 05/05/2021  ? MCV 89.9 05/05/2021  ? PLT 340 05/05/2021  ? ?No results found for: IRON, TIBC, FERRITIN ? ?Attestation Statements:  ? ?Reviewed by clinician on day of visit: allergies, medications, problem list, medical history, surgical history, family history, social history, and previous encounter notes. ? ?I, Lizbeth Bark, RMA, am acting as transcriptionist for [employer], DO. ? ?I have reviewed the above documentation for accuracy and completeness, and I agree with the above. Jearld Lesch, DO ? ?

## 2022-03-11 ENCOUNTER — Ambulatory Visit (INDEPENDENT_AMBULATORY_CARE_PROVIDER_SITE_OTHER): Payer: BC Managed Care – PPO | Admitting: Bariatrics

## 2022-03-19 ENCOUNTER — Ambulatory Visit (INDEPENDENT_AMBULATORY_CARE_PROVIDER_SITE_OTHER): Payer: BC Managed Care – PPO | Admitting: Bariatrics

## 2022-03-19 ENCOUNTER — Encounter (INDEPENDENT_AMBULATORY_CARE_PROVIDER_SITE_OTHER): Payer: Self-pay | Admitting: Bariatrics

## 2022-03-19 VITALS — BP 158/85 | HR 88 | Temp 98.1°F | Ht 66.0 in | Wt 217.0 lb

## 2022-03-19 DIAGNOSIS — I10 Essential (primary) hypertension: Secondary | ICD-10-CM | POA: Diagnosis not present

## 2022-03-19 DIAGNOSIS — Z6835 Body mass index (BMI) 35.0-35.9, adult: Secondary | ICD-10-CM | POA: Diagnosis not present

## 2022-03-19 DIAGNOSIS — Z7985 Long-term (current) use of injectable non-insulin antidiabetic drugs: Secondary | ICD-10-CM

## 2022-03-19 DIAGNOSIS — E669 Obesity, unspecified: Secondary | ICD-10-CM

## 2022-03-19 DIAGNOSIS — E1169 Type 2 diabetes mellitus with other specified complication: Secondary | ICD-10-CM

## 2022-03-19 MED ORDER — TIRZEPATIDE 2.5 MG/0.5ML ~~LOC~~ SOAJ: 2.5000 mg | SUBCUTANEOUS | 0 refills | Status: DC

## 2022-03-21 NOTE — Progress Notes (Signed)
Chief Complaint:   OBESITY Jenna Chavez is here to discuss her progress with her obesity treatment plan along with follow-up of her obesity related diagnoses. Jenna Chavez is on the Category 2 Plan and states she is following her eating plan approximately 20% of the time. Jenna Chavez states she is doing 0 minutes 0 times per week.  Today's visit was #: 12 Starting weight: 223 lbs Starting date: 07/03/2021 Today's weight: 217 lbs Today's date: 03/19/2022 Total lbs lost to date: 6 lbs Total lbs lost since last in-office visit: 0  Interim History: Jenna Chavez is up 5 lbs since her last visit.   Subjective:   1. Diabetes mellitus type 2 in obese [facility]) Jenna Chavez is not on medications. Her last A1C was 6.2. She is not on absolute contraindications.   2. Primary hypertension Jenna Chavez is currently taking Bystolic. Her blood pressure is basically controlled. She is taking her medications as directed. Her blood pressure is slightly elevated today 158/85.   Assessment/Plan:   1. Diabetes mellitus type 2 in obese ([facility]) We will refill Mounjaro 2.5 mg for 1 month with no refills. Good blood sugar control is important to decrease the likelihood of diabetic complications such as nephropathy, neuropathy, limb loss, blindness, coronary artery disease, and death. Intensive lifestyle modification including diet, exercise and weight loss are the first line of treatment for diabetes.   - tirzepatide [facility]) 2.5 MG/0.5ML Pen; Inject 2.5 mg into the skin once a week.  Dispense: 2 mL; Refill: 0  2. Primary hypertension Jenna Chavez will continue her medications. She will have no added salt. She is working on healthy weight loss and exercise to improve blood pressure control. We will watch for signs of hypotension as she continues her lifestyle modifications.  3. Obesity, Current BMI 35.1 Jenna Chavez is currently in the action stage of change. As such, her goal is to continue with weight loss efforts. She has agreed to the Category 2  Plan.   Jenna Chavez will continue meal planning and she will continue intentional eating. She will will stay on the plan 80-90%.   Exercise goals: No exercise has been prescribed at this time.  Behavioral modification strategies: increasing lean protein intake, decreasing simple carbohydrates, increasing vegetables, increasing water intake, decreasing eating out, no skipping meals, meal planning and cooking strategies, keeping healthy foods in the home, and planning for success.  Jenna Chavez has agreed to follow-up with our clinic in 3-4 weeks. She was informed of the importance of frequent follow-up visits to maximize her success with intensive lifestyle modifications for her multiple health conditions.   Objective:   Blood pressure (!) 158/85, pulse 88, temperature 98.1 F (36.7 C), height '5\' 6"'$  (1.676 m), weight 217 lb (98.4 kg), SpO2 94 %. Body mass index is 35.02 kg/m.  General: Cooperative, alert, well developed, in no acute distress. HEENT: Conjunctivae and lids unremarkable. Cardiovascular: Regular rhythm.  Lungs: Normal work of breathing. Neurologic: No focal deficits.   Lab Results  Component Value Date   CREATININE 1.08 (H) 12/31/2021   BUN 16 12/31/2021   NA 142 12/31/2021   K 3.9 12/31/2021   CL 106 12/31/2021   CO2 23 12/31/2021   Lab Results  Component Value Date   ALT 11 12/31/2021   AST 15 12/31/2021   ALKPHOS 63 12/31/2021   BILITOT 0.5 12/31/2021   Lab Results  Component Value Date   HGBA1C 6.2 (H) 12/31/2021   HGBA1C 6.9 (H) 07/03/2021   HGBA1C 6.1 (H) 02/07/2020   Lab Results  Component  Value Date   INSULIN 19.8 12/31/2021   INSULIN 27.2 (H) 07/03/2021   INSULIN 25.4 (H) 02/07/2020   Lab Results  Component Value Date   TSH 0.839 07/03/2021   Lab Results  Component Value Date   CHOL 229 (H) 12/31/2021   HDL 77 12/31/2021   LDLCALC 135 (H) 12/31/2021   TRIG 99 12/31/2021   Lab Results  Component Value Date   VD25OH 45.1 12/31/2021   VD25OH  11.1 (L) 02/10/2020   VD25OH 12.0 (L) 02/07/2020   Lab Results  Component Value Date   WBC 7.7 05/05/2021   HGB 14.7 05/05/2021   HCT 45.3 05/05/2021   MCV 89.9 05/05/2021   PLT 340 05/05/2021   No results found for: IRON, TIBC, FERRITIN  Attestation Statements:   Reviewed by clinician on day of visit: allergies, medications, problem list, medical history, surgical history, family history, social history, and previous encounter notes.  I, Lizbeth Bark, RMA, am acting as Location manager for [employer], DO.  I have reviewed the above documentation for accuracy and completeness, and I agree with the above. Jearld Lesch, DO

## 2022-03-26 ENCOUNTER — Encounter (INDEPENDENT_AMBULATORY_CARE_PROVIDER_SITE_OTHER): Payer: Self-pay | Admitting: Bariatrics

## 2022-03-27 NOTE — Progress Notes (Signed)
Referring Provider: Redmond School, MD Primary Care Physician:  Redmond School, MD Primary Gastroenterologist:  Dr. Gala Romney  Chief Complaint  Patient presents with   Abdominal Pain    Low abdominal pain, possible colonoscopy     HPI:   Jenna Chavez is a 56 y.o. female presenting today at the request of Redmond School, MD for abdominal pain. Referral sent in March 2023.   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  She was previously evaluated at [facility] in 2014 for chronic abdominal pain and suspected to have abdominal wall pain.  History of cholecystectomy.  CT A/P with contrast in 2018 for abdominal pain with no acute findings.  CT A/P with contrast in June 2020 with no findings to explain reported right flank pain at that time.  She did have an exophytic left-sided parapelvic cyst which appeared to have a peripheral soft tissue component and had substantially enlarged, better characterized on MRI in 2019, and recommended 1 year follow-up to ensure long-term stability.  She underwent left robotic assisted laparoscopic partial nephrectomy in February 2021, pathology with angiomyolipoma with epithelial cyst.  CT renal study in July 2022 for left flank pain with nonobstructive renal stones, small fat-containing left inguinal hernia, umbilical hernia, ventral hernia x2.   Last colonoscopy in June 2018 with normal exam.  Recommended 5-year repeat due to family history of colon polyps.  Last EGD in 1999 with normal exam, prior ulcer healed.   Today:  Was having some bilateral flank pain and suprapubic pressure back in March.  Went to her PCP and they weren't sure what was going on. Wondered if she had a bladder issue. Urine was checked and things checked out ok.  Also saw urologist who told her everything looked okay.  Pain improved and was  intermittent without specific trigger.  Ultimately resolved on its own.  She has not prescribed any medications to help with this. Last occurred about 2 weeks ago.  States this has happened many times before, starts out of no where and seems to resolve out of no where.  Also has chronic back pain that flares intermittently without cause. States some days she will wake up and not be able to walk having to use a cane. No specific reason. Has DDD and arthritis.   No relation to meals or bowel habits. Has chronic constipation. Bowels move about 3 times a week. Supposed to take colace BID and MiraLAX daily, but doesn't take daily. If eating well and drinking more she will have regular bowel movements daily. No brbpr or melena.   No nausea, vomiting. GERD well controlled on Protonix daily. No dysphagia.   Will start [facility] Sunday.   Past Medical History:  Diagnosis Date   Anemia    Back pain    Constipation    Degenerative disc disease, cervical    Diabetes (HCC)    Elevated cholesterol    Gastric ulcer 1999   EGD per Dr. Gala Romney, negative for malignancy   GERD (gastroesophageal reflux disease)    Glucose intolerance (impaired glucose tolerance)    History of kidney stones    Hypertension    Palpitations    Pre-diabetes    Renal cyst, left    Sleep apnea    Stomach ulcer    Vitamin D deficiency     Past Surgical History:  Procedure Laterality Date  CHOLECYSTECTOMY     COLONOSCOPY  06/21/2011   RMR: normal rectum, colon, TI, repeat Aug 2017   COLONOSCOPY N/A 04/10/2017   Surgeon: Daneil Dolin, MD; Normal exam, repeat in 5 years.   ESOPHAGOGASTRODUODENOSCOPY  06/21/2011   Planned but not done at time of TCS, no need   OOPHORECTOMY     and fallopian tube on left side   PARTIAL HYSTERECTOMY     Right toe surgery     ROBOTIC ASSITED PARTIAL NEPHRECTOMY Left 12/10/2019   Procedure: XI ROBOTIC ASSITED PARTIAL NEPHRECTOMY;  Surgeon: Alexis Frock, MD;  Location: [facility];  Service:  Urology;  Laterality: Left;  3 HRS   WISDOM TOOTH EXTRACTION      Current Outpatient Medications  Medication Sig Dispense Refill   BORIC ACID EX Place 600 mg vaginally as needed.     Cholecalciferol (VITAMIN D3) 1.25 MG (50000 UT) CAPS Take 2,000 Units by mouth.     docusate sodium (COLACE) 100 MG capsule Take 100 mg by mouth 2 (two) times daily.     lidocaine (LIDODERM) 5 % Place 1 patch onto the skin daily. Remove & Discard patch within 12 hours or as directed by MD 30 patch 0   lisinopril (ZESTRIL) 2.5 MG tablet Take 2.5 mg by mouth daily.     methocarbamol (ROBAXIN) 500 MG tablet Take 1 tablet (500 mg total) by mouth 2 (two) times daily. 20 tablet 0   nebivolol (BYSTOLIC) 10 MG tablet TAKE 1 TABLET BY MOUTH EVERY DAY 90 tablet 3   nebivolol (BYSTOLIC) 5 MG tablet Bystolic 5 mg tablet   1 tablet every day by oral route.     Oxycodone HCl 10 MG TABS Take 10 mg by mouth as needed.     pantoprazole (PROTONIX) 20 MG tablet Take 1 tablet (20 mg total) by mouth daily. 30 tablet 0   polyethylene glycol (MIRALAX / GLYCOLAX) 17 g packet Take 17 g by mouth daily.     pravastatin (PRAVACHOL) 40 MG tablet Take 40 mg by mouth daily.      tirzepatide [facility]) 2.5 MG/0.5ML Pen Inject 2.5 mg into the skin once a week. (Patient not taking: Reported on 03/29/2022) 2 mL 0   No current facility-administered medications for this visit.    Allergies as of 03/29/2022 - Review Complete 03/29/2022  Allergen Reaction Noted   Hydrocodone  12/12/2018   Dilaudid [hydromorphone hcl] Palpitations 12/12/2018    Family History  Problem Relation Age of Onset   Heart attack Mother    Diabetes Mother    High blood pressure Mother    Sudden death Mother    Obesity Mother    Colon polyps Father        onset early age   Heart disease Father    High Cholesterol Father    Stroke Father    Obesity Father    Colon polyps Sister        age 48   Heart disease Sister        defibrillator after myocarditis    Colon polyps Brother        age 46   Colon cancer Neg Hx     Social History   Socioeconomic History   Marital status: Married    Spouse name: Christia Reading   Number of children: Not on file   Years of education: Not on file   Highest education level: Not on file  Occupational History   Occupation: CNA  Tobacco Use   Smoking  status: Never   Smokeless tobacco: Never  Vaping Use   Vaping Use: Never used  Substance and Sexual Activity   Alcohol use: No   Drug use: No   Sexual activity: Not on file  Other Topics Concern   Not on file  Social History Narrative   Not on file   Social Determinants of Health   Financial Resource Strain: Not on file  Food Insecurity: Not on file  Transportation Needs: Not on file  Physical Activity: Not on file  Stress: Not on file  Social Connections: Not on file  Intimate Partner Violence: Not on file    Review of Systems: Gen: Denies any fever, chills, cold or flulike symptoms, presyncope, syncope. CV: Denies chest pain, heart palpitations.  Resp: Denies shortness of breath, cough.  GI: See HPI GU : Denies urinary burning, urinary frequency, urinary hesitancy MS: Denies joint pain. Derm: Denies rash. Psych: Denies depression, anxiety. Heme: See HPI  Physical Exam: BP (!) 144/86   Pulse 70   Temp 97.6 F (36.4 C) (Temporal)   Ht '5\' 6"'$  (1.676 m)   Wt 224 lb 9.6 oz (101.9 kg)   BMI 36.25 kg/m  General:   Alert and oriented. Pleasant and cooperative. Well-nourished and well-developed.  Head:  Normocephalic and atraumatic. Eyes:  Without icterus, sclera clear and conjunctiva pink.  Ears:  Normal auditory acuity. Lungs:  Clear to auscultation bilaterally. No wheezes, rales, or rhonchi. No distress.  Heart:  S1, S2 present without murmurs appreciated.  Abdomen:  +BS, soft, non-tender and non-distended. No HSM noted. No guarding or rebound. No masses appreciated.  Rectal:  Deferred  Msk:  Symmetrical without gross deformities. Normal  posture. Extremities:  Without edema. Neurologic:  Alert and  oriented x4;  grossly normal neurologically. Skin:  Intact without significant lesions or rashes. Psych:  Normal mood and affect.    Assessment:  56 year old female with history of diabetes, HTN, HLD, chronic constipation with suspected pelvic floor dyssynergia, GERD, remote history of PUD, presenting today at request of Dr. Riley Kill for further evaluation of abdominal pain.   Abdominal pain:  Patient reports chronic history of intermittent flares of bilateral flank pain.  Symptoms flared in March and also noticed some pressure in her suprapubic area at that time.  She saw her PCP as she was concerned about a bladder issue, but states her urine was checked and she also saw urologist with no significant findings.  Ultimately, her symptoms resolved on their own which is pretty typical of her intermittent bilateral flank pain.  No association with bowel movements or meals and no alarming GI symptoms.  Overall, suspect MSK etiology related to chronic back pain. No additional work-up needed at this time.   Constipation:  Chronic history of constipation that is fairly well managed with diet.  States she is to use Colace twice daily and MiraLAX daily, but does not take these medications every day.  No alarm symptoms.  Encouraged healthy diet with plenty of fiber, only 64 ounces of water daily, and may continue to use Colace twice daily and MiraLAX daily as needed.  Colon cancer screening/family history of colon polyps: Currently due for screening colonoscopy, on 5-year plan due to family history of colon polyps in her father before age 62.  Brother and sister also with history of polyps in their 20s.  Clinically, she is doing well with no alarm symptoms.   Plan:  Proceed with colonoscopy with propofol by Dr. Gala Romney in near future. The risks,  benefits, and alternatives have been discussed with the patient in detail. The patient states  understanding and desires to proceed. ASA 2 Encourage patient to take Colace and MiraLAX daily x1 week prior to her colonoscopy to ensure bowels are moving well and augment adequate colon prep. Continue following a healthy diet that is high in fiber and drink at least 64 ounces of water. Follow-up as needed.   Aliene Altes, PA-C [facility] 03/29/2022

## 2022-03-28 ENCOUNTER — Telehealth (INDEPENDENT_AMBULATORY_CARE_PROVIDER_SITE_OTHER): Payer: Self-pay | Admitting: Bariatrics

## 2022-03-28 NOTE — Telephone Encounter (Signed)
Pt calling stated Dr. Owens Shark okay'd her to have [facility] but the pharmacy stated her ins may not cover it and they told her it had to do with prior authorizations but did not know if it was because we did not authorize the medication or what the issue ended up being. Pt was told by pharmacy to speak with [facility] about the prior authorization process. Told patient Brayton Layman was working with other patients and would give her a call back. Pt very nice and waiting on call whenever possible. The best call back number is [phone].

## 2022-03-29 ENCOUNTER — Encounter: Payer: Self-pay | Admitting: Gastroenterology

## 2022-03-29 ENCOUNTER — Other Ambulatory Visit (INDEPENDENT_AMBULATORY_CARE_PROVIDER_SITE_OTHER): Payer: Self-pay | Admitting: Bariatrics

## 2022-03-29 ENCOUNTER — Ambulatory Visit (INDEPENDENT_AMBULATORY_CARE_PROVIDER_SITE_OTHER): Payer: BC Managed Care – PPO | Admitting: Gastroenterology

## 2022-03-29 VITALS — BP 144/86 | HR 70 | Temp 97.6°F | Ht 66.0 in | Wt 224.6 lb

## 2022-03-29 DIAGNOSIS — Z8371 Family history of colonic polyps: Secondary | ICD-10-CM

## 2022-03-29 DIAGNOSIS — Z1211 Encounter for screening for malignant neoplasm of colon: Secondary | ICD-10-CM | POA: Diagnosis not present

## 2022-03-29 DIAGNOSIS — K5904 Chronic idiopathic constipation: Secondary | ICD-10-CM

## 2022-03-29 DIAGNOSIS — R1030 Lower abdominal pain, unspecified: Secondary | ICD-10-CM

## 2022-03-29 DIAGNOSIS — E1169 Type 2 diabetes mellitus with other specified complication: Secondary | ICD-10-CM

## 2022-03-29 NOTE — Patient Instructions (Signed)
We will arrange for you to have a colonoscopy in the near future with Dr. Gala Romney.  1 week prior to your colonoscopy, be sure to take Colace 100 mg in the morning and evening and MiraLAX 17 g daily to ensure that your bowels are moving very well before starting your colon prep.  For regular management of your constipation: It is fine to take Colace twice daily and MiraLAX 17 g every day. Continue to follow a healthy diet that is high in fiber and drink at least 64 ounces of water daily to keep your bowels moving well. If you have any worsening constipation issues, please let me know.  I suspect your flank pain and possibly her prior lower abdominal pain may have been related to your chronic back pain.  I do not think any additional work-up is needed at this time.  I am glad you are feeling better!  It was great to meet you today!  I hope you have a great summer!  We will plan to see you back as needed.  Aliene Altes, PA-C [facility]

## 2022-04-01 ENCOUNTER — Telehealth: Payer: Self-pay | Admitting: *Deleted

## 2022-04-01 ENCOUNTER — Encounter (INDEPENDENT_AMBULATORY_CARE_PROVIDER_SITE_OTHER): Payer: Self-pay

## 2022-04-01 ENCOUNTER — Telehealth (INDEPENDENT_AMBULATORY_CARE_PROVIDER_SITE_OTHER): Payer: Self-pay | Admitting: Bariatrics

## 2022-04-01 MED ORDER — PEG 3350-KCL-NA BICARB-NACL 420 G PO SOLR: ORAL | 0 refills | Status: DC

## 2022-04-01 NOTE — Telephone Encounter (Signed)
Patient sent mychart message with prior authorization denial message for [facility].

## 2022-04-01 NOTE — Telephone Encounter (Signed)
Dr. Owens Shark - Prior authorization denied for [facility]. Per insurance: *Drug Not Covered/Plan Exclusion - Your request for coverage was denied because your prescription benefit plan does not cover the requested medication. Patient sent denial message via mychart.

## 2022-04-01 NOTE — Telephone Encounter (Signed)
Called pt. Scheduled for TCS with Dr. Gala Romney, ASA 2 on 6/23 at 7:30am. Aware will send instructions to mychart and rx for prep to pharmacy.

## 2022-04-12 ENCOUNTER — Ambulatory Visit (HOSPITAL_COMMUNITY): Payer: BC Managed Care – PPO | Admitting: Anesthesiology

## 2022-04-12 ENCOUNTER — Encounter (HOSPITAL_COMMUNITY): Payer: Self-pay | Admitting: Internal Medicine

## 2022-04-12 ENCOUNTER — Encounter (HOSPITAL_COMMUNITY)
Admission: RE | Disposition: A | Discharge status: Home / Self Care | Payer: Self-pay | Source: Home / Self Care | Attending: Internal Medicine

## 2022-04-12 ENCOUNTER — Ambulatory Visit (HOSPITAL_COMMUNITY)
Admission: RE | Admit: 2022-04-12 | Discharge: 2022-04-12 | Disposition: A | Discharge status: Home / Self Care | Payer: BC Managed Care – PPO | Attending: Internal Medicine | Admitting: Internal Medicine

## 2022-04-12 ENCOUNTER — Other Ambulatory Visit: Payer: Self-pay

## 2022-04-12 DIAGNOSIS — K219 Gastro-esophageal reflux disease without esophagitis: Secondary | ICD-10-CM | POA: Insufficient documentation

## 2022-04-12 DIAGNOSIS — R1032 Left lower quadrant pain: Secondary | ICD-10-CM

## 2022-04-12 DIAGNOSIS — R1013 Epigastric pain: Secondary | ICD-10-CM

## 2022-04-12 DIAGNOSIS — D649 Anemia, unspecified: Secondary | ICD-10-CM | POA: Insufficient documentation

## 2022-04-12 DIAGNOSIS — I10 Essential (primary) hypertension: Secondary | ICD-10-CM

## 2022-04-12 DIAGNOSIS — E878 Other disorders of electrolyte and fluid balance, not elsewhere classified: Secondary | ICD-10-CM

## 2022-04-12 DIAGNOSIS — Z8371 Family history of colonic polyps: Secondary | ICD-10-CM | POA: Insufficient documentation

## 2022-04-12 DIAGNOSIS — Z8711 Personal history of peptic ulcer disease: Secondary | ICD-10-CM | POA: Diagnosis not present

## 2022-04-12 DIAGNOSIS — N2889 Other specified disorders of kidney and ureter: Secondary | ICD-10-CM

## 2022-04-12 DIAGNOSIS — Z1211 Encounter for screening for malignant neoplasm of colon: Secondary | ICD-10-CM

## 2022-04-12 DIAGNOSIS — D759 Disease of blood and blood-forming organs, unspecified: Secondary | ICD-10-CM | POA: Diagnosis not present

## 2022-04-12 DIAGNOSIS — E66811 Obesity, class 1: Secondary | ICD-10-CM

## 2022-04-12 DIAGNOSIS — R1030 Lower abdominal pain, unspecified: Secondary | ICD-10-CM

## 2022-04-12 DIAGNOSIS — E559 Vitamin D deficiency, unspecified: Secondary | ICD-10-CM

## 2022-04-12 DIAGNOSIS — E1169 Type 2 diabetes mellitus with other specified complication: Secondary | ICD-10-CM

## 2022-04-12 DIAGNOSIS — E119 Type 2 diabetes mellitus without complications: Secondary | ICD-10-CM

## 2022-04-12 DIAGNOSIS — K5900 Constipation, unspecified: Secondary | ICD-10-CM

## 2022-04-12 DIAGNOSIS — Z83719 Family history of colon polyps, unspecified: Secondary | ICD-10-CM

## 2022-04-12 DIAGNOSIS — K5904 Chronic idiopathic constipation: Secondary | ICD-10-CM

## 2022-04-12 DIAGNOSIS — E6609 Other obesity due to excess calories: Secondary | ICD-10-CM

## 2022-04-12 DIAGNOSIS — R7303 Prediabetes: Secondary | ICD-10-CM

## 2022-04-12 DIAGNOSIS — Z8249 Family history of ischemic heart disease and other diseases of the circulatory system: Secondary | ICD-10-CM

## 2022-04-12 DIAGNOSIS — G4730 Sleep apnea, unspecified: Secondary | ICD-10-CM | POA: Diagnosis not present

## 2022-04-12 HISTORY — PX: COLONOSCOPY WITH PROPOFOL: SHX5780

## 2022-04-12 SURGERY — COLONOSCOPY WITH PROPOFOL
Anesthesia: General

## 2022-04-12 MED ORDER — PROPOFOL 10 MG/ML IV BOLUS
INTRAVENOUS | Status: DC | PRN
  Administered 2022-04-12: 20 mg via INTRAVENOUS
  Administered 2022-04-12: 100 mg via INTRAVENOUS

## 2022-04-12 MED ORDER — LACTATED RINGERS IV SOLN: INTRAVENOUS | Status: DC

## 2022-04-12 MED ORDER — LIDOCAINE 2% (20 MG/ML) 5 ML SYRINGE
INTRAMUSCULAR | Status: DC | PRN
  Administered 2022-04-12: 50 mg via INTRAVENOUS

## 2022-04-12 MED ORDER — PROPOFOL 500 MG/50ML IV EMUL
INTRAVENOUS | Status: DC | PRN
  Administered 2022-04-12: 200 ug/kg/min via INTRAVENOUS

## 2022-04-12 NOTE — Anesthesia Preprocedure Evaluation (Addendum)
Anesthesia Evaluation  Patient identified by MRN, date of birth, ID band Patient awake    Reviewed: Allergy & Precautions, NPO status , Patient's Chart, lab work & pertinent test results, reviewed documented beta blocker date and time   Airway Mallampati: I  TM Distance: >3 FB Neck ROM: Full    Dental  (+) Dental Advisory Given, Chipped Right upper broken tooth:   Pulmonary sleep apnea ,    Pulmonary exam normal breath sounds clear to auscultation       Cardiovascular Exercise Tolerance: Good hypertension, Pt. on medications and Pt. on home beta blockers Normal cardiovascular exam Rhythm:Regular Rate:Normal     Neuro/Psych negative neurological ROS  negative psych ROS   GI/Hepatic Neg liver ROS, PUD, GERD  Medicated and Controlled,  Endo/Other  diabetes, Well Controlled, Type 2  Renal/GU Renal disease (renal mass - left partial nephrectomy)  negative genitourinary   Musculoskeletal  (+) Arthritis , Osteoarthritis,    Abdominal   Peds negative pediatric ROS (+)  Hematology  (+) Blood dyscrasia, anemia ,   Anesthesia Other Findings Back pain  Reproductive/Obstetrics negative OB ROS                            Anesthesia Physical Anesthesia Plan  ASA: 2  Anesthesia Plan: General   Post-op Pain Management: Minimal or no pain anticipated   Induction: Intravenous  PONV Risk Score and Plan: Propofol infusion  Airway Management Planned: Nasal Cannula and Natural Airway  Additional Equipment:   Intra-op Plan:   Post-operative Plan:   Informed Consent: I have reviewed the patients History and Physical, chart, labs and discussed the procedure including the risks, benefits and alternatives for the proposed anesthesia with the patient or authorized representative who has indicated his/her understanding and acceptance.     Dental advisory given  Plan Discussed with: CRNA and  Surgeon  Anesthesia Plan Comments:        Anesthesia Quick Evaluation

## 2022-04-16 ENCOUNTER — Encounter (INDEPENDENT_AMBULATORY_CARE_PROVIDER_SITE_OTHER): Payer: Self-pay | Admitting: Bariatrics

## 2022-04-16 ENCOUNTER — Ambulatory Visit (INDEPENDENT_AMBULATORY_CARE_PROVIDER_SITE_OTHER): Payer: BC Managed Care – PPO | Admitting: Bariatrics

## 2022-04-16 VITALS — BP 146/85 | HR 72 | Temp 97.9°F | Ht 63.0 in | Wt 217.0 lb

## 2022-04-16 DIAGNOSIS — E669 Obesity, unspecified: Secondary | ICD-10-CM | POA: Diagnosis not present

## 2022-04-16 DIAGNOSIS — E1169 Type 2 diabetes mellitus with other specified complication: Secondary | ICD-10-CM

## 2022-04-16 DIAGNOSIS — E785 Hyperlipidemia, unspecified: Secondary | ICD-10-CM | POA: Diagnosis not present

## 2022-04-16 DIAGNOSIS — Z6835 Body mass index (BMI) 35.0-35.9, adult: Secondary | ICD-10-CM

## 2022-04-16 MED ORDER — OZEMPIC (0.25 OR 0.5 MG/DOSE) 2 MG/3ML ~~LOC~~ SOPN: PEN_INJECTOR | SUBCUTANEOUS | 0 refills | Status: DC

## 2022-04-17 NOTE — Progress Notes (Unsigned)
Chief Complaint:   OBESITY Jenna Chavez is here to discuss her progress with her obesity treatment plan along with follow-up of her obesity related diagnoses. Jenna Chavez is on the Category 2 Plan and states she is following her eating plan approximately 5% of the time. Jenna Chavez states she is doing 0 minutes 0 times per week.  Today's visit was #: 68 Starting weight: 223 lbs Starting date: 07/03/2021 Today's weight: 217 lbs Today's date: 04/16/2022 Total lbs lost to date: 6 Total lbs lost since last in-office visit: 0  Interim History: Jenna Chavez's weight remains the same as her last visit.  She is drinking adequate water.  Subjective:   1. Diabetes mellitus type 2 in obese [facility]) Jenna Chavez was unable to get [facility].  2. Hyperlipidemia associated with type 2 diabetes mellitus ([place]) Jenna Chavez is currently taking Pravachol.  Assessment/Plan:   1. Diabetes mellitus type 2 in obese [facility]) Jenna Chavez agreed to start Ozempic 0.25 mg once weekly, with no refills.  We will follow-up at her next visit.  - Semaglutide,0.25 or 0.'5MG'$ /DOS, (OZEMPIC, 0.25 OR 0.5 MG/DOSE,) 2 MG/3ML SOPN; Inject 0.25 mg into the skin weekly  Dispense: 3 mL; Refill: 0  2. Hyperlipidemia associated with type 2 diabetes mellitus ([place]) Jenna Chavez will continue Pravachol, and we will follow-up at her next visit.  3. Obesity, Current BMI 35.1 Jenna Chavez is currently in the action stage of change. As such, her goal is to continue with weight loss efforts. She has agreed to the Category 2 Plan.   Meal planning and intentional eating were discussed.  Jenna Chavez will adhere more closely to the plan.  Exercise goals: No exercise has been prescribed at this time.  Behavioral modification strategies: increasing lean protein intake, decreasing simple carbohydrates, increasing vegetables, increasing water intake, decreasing eating out, no skipping meals, meal planning and cooking strategies, keeping healthy foods in the home, and planning for  success.  Jenna Chavez has agreed to follow-up with our clinic in 3 weeks. She was informed of the importance of frequent follow-up visits to maximize her success with intensive lifestyle modifications for her multiple health conditions.   Objective:   Blood pressure (!) 146/85, pulse 72, temperature 97.9 F (36.6 C), height '5\' 3"'$  (1.6 m), weight 217 lb (98.4 kg), SpO2 99 %. Body mass index is 38.44 kg/m.  General: Cooperative, alert, well developed, in no acute distress. HEENT: Conjunctivae and lids unremarkable. Cardiovascular: Regular rhythm.  Lungs: Normal work of breathing. Neurologic: No focal deficits.   Lab Results  Component Value Date   CREATININE 1.08 (H) 12/31/2021   BUN 16 12/31/2021   NA 142 12/31/2021   K 3.9 12/31/2021   CL 106 12/31/2021   CO2 23 12/31/2021   Lab Results  Component Value Date   ALT 11 12/31/2021   AST 15 12/31/2021   ALKPHOS 63 12/31/2021   BILITOT 0.5 12/31/2021   Lab Results  Component Value Date   HGBA1C 6.2 (H) 12/31/2021   HGBA1C 6.9 (H) 07/03/2021   HGBA1C 6.1 (H) 02/07/2020   Lab Results  Component Value Date   INSULIN 19.8 12/31/2021   INSULIN 27.2 (H) 07/03/2021   INSULIN 25.4 (H) 02/07/2020   Lab Results  Component Value Date   TSH 0.839 07/03/2021   Lab Results  Component Value Date   CHOL 229 (H) 12/31/2021   HDL 77 12/31/2021   LDLCALC 135 (H) 12/31/2021   TRIG 99 12/31/2021   Lab Results  Component Value Date   VD25OH 45.1 12/31/2021   VD25OH  11.1 (L) 02/10/2020   VD25OH 12.0 (L) 02/07/2020   Lab Results  Component Value Date   WBC 7.7 05/05/2021   HGB 14.7 05/05/2021   HCT 45.3 05/05/2021   MCV 89.9 05/05/2021   PLT 340 05/05/2021   No results found for: "IRON", "TIBC", "FERRITIN"  Attestation Statements:   Reviewed by clinician on day of visit: allergies, medications, problem list, medical history, surgical history, family history, social history, and previous encounter notes.   Wilhemena Durie,  am acting as Location manager for [employer], DO.  I have reviewed the above documentation for accuracy and completeness, and I agree with the above. Jearld Lesch, DO

## 2022-04-18 ENCOUNTER — Encounter (HOSPITAL_COMMUNITY): Payer: Self-pay | Admitting: Internal Medicine

## 2022-05-06 ENCOUNTER — Encounter (INDEPENDENT_AMBULATORY_CARE_PROVIDER_SITE_OTHER): Payer: Self-pay | Admitting: Nurse Practitioner

## 2022-05-06 ENCOUNTER — Ambulatory Visit (INDEPENDENT_AMBULATORY_CARE_PROVIDER_SITE_OTHER): Payer: BC Managed Care – PPO | Admitting: Nurse Practitioner

## 2022-05-06 VITALS — BP 138/78 | HR 80 | Temp 98.0°F | Ht 63.0 in | Wt 214.0 lb

## 2022-05-06 DIAGNOSIS — Z6838 Body mass index (BMI) 38.0-38.9, adult: Secondary | ICD-10-CM

## 2022-05-06 DIAGNOSIS — Z7985 Long-term (current) use of injectable non-insulin antidiabetic drugs: Secondary | ICD-10-CM

## 2022-05-06 DIAGNOSIS — E669 Obesity, unspecified: Secondary | ICD-10-CM | POA: Diagnosis not present

## 2022-05-06 DIAGNOSIS — E1169 Type 2 diabetes mellitus with other specified complication: Secondary | ICD-10-CM | POA: Diagnosis not present

## 2022-05-06 MED ORDER — INSULIN PEN NEEDLE 31G X 5 MM MISC: 0 refills | Status: DC

## 2022-05-07 NOTE — Progress Notes (Unsigned)
Chief Complaint:   OBESITY Jenna Chavez is here to discuss her progress with her obesity treatment plan along with follow-up of her obesity related diagnoses. Stanley is on the Category 2 Plan and states she is following her eating plan approximately 70% of the time. Chanique states she is walking 30 minutes 3 times per week.  Today's visit was #: 14 Starting weight: 223 lbs Starting date: 07/03/2021 Today's weight: 214 lbs Today's date: 05/06/2022 Total lbs lost to date: 9 lbs Total lbs lost since last in-office visit: 3  Interim History: Jenna Chavez has done well with weight loss since her last visit. Started Ozempic after last visit and is doing well. Some huger noted with occasional sugar cravings. She is aiming to get more protein in. She is drinking water, Ice drinks and protein shakes.  Subjective:   1. Diabetes mellitus type 2 in obese [name] is currently taking Ozempic 0.25 mg. She started after last visit. Denies side effects. She is on ACE and statin. Her last eye exam : 2023, last A1c was 6.2. Fasting blood sugar today was 113. She does not check her blood sugars on a regular basis. Denies hypoglycemia. She was prescribed Mounjaro in the past but was not able to take it due to cost.  Assessment/Plan:   1. Diabetes mellitus type 2 in obese [name] will continue taking Ozempic 0.25 mg. Side effects discussed. Insulin needles ordered.  Good blood sugar control is important to decrease the likelihood of diabetic complications such as nephropathy, neuropathy, limb loss, blindness, coronary artery disease, and death. Intensive lifestyle modification including diet, exercise and weight loss are the first line of treatment for diabetes.    -Refill Insulin Pen Needle 31G X 5 MM MISC; Take as directed with Ozempic  Dispense: 30 each; Refill: 0  2. Obesity, Current BMI 38.0 Jenna Chavez is currently in the action stage of change. As such, her goal is to continue with weight loss efforts.  She has agreed to the Category 2 Plan.   Exercise goals: As is.  Behavioral modification strategies: increasing lean protein intake, increasing water intake, and planning for success.  [name] has agreed to follow-up with our clinic in 3 weeks. She was informed of the importance of frequent follow-up visits to maximize her success with intensive lifestyle modifications for her multiple health conditions.   Objective:   Blood pressure 138/78, pulse 80, temperature 98 F (36.7 C), height '5\' 3"'$  (1.6 m), weight 214 lb (97.1 kg), SpO2 100 %. Body mass index is 37.91 kg/m.  General: Cooperative, alert, well developed, in no acute distress. HEENT: Conjunctivae and lids unremarkable. Cardiovascular: Regular rhythm.  Lungs: Normal work of breathing. Neurologic: No focal deficits.   Lab Results  Component Value Date   CREATININE 1.08 (H) 12/31/2021   BUN 16 12/31/2021   NA 142 12/31/2021   K 3.9 12/31/2021   CL 106 12/31/2021   CO2 23 12/31/2021   Lab Results  Component Value Date   ALT 11 12/31/2021   AST 15 12/31/2021   ALKPHOS 63 12/31/2021   BILITOT 0.5 12/31/2021   Lab Results  Component Value Date   HGBA1C 6.2 (H) 12/31/2021   HGBA1C 6.9 (H) 07/03/2021   HGBA1C 6.1 (H) 02/07/2020   Lab Results  Component Value Date   INSULIN 19.8 12/31/2021   INSULIN 27.2 (H) 07/03/2021   INSULIN 25.4 (H) 02/07/2020   Lab Results  Component Value Date   TSH 0.839 07/03/2021   Lab Results  Component Value Date   CHOL 229 (H) 12/31/2021   HDL 77 12/31/2021   LDLCALC 135 (H) 12/31/2021   TRIG 99 12/31/2021   Lab Results  Component Value Date   VD25OH 45.1 12/31/2021   VD25OH 11.1 (L) 02/10/2020   VD25OH 12.0 (L) 02/07/2020   Lab Results  Component Value Date   WBC 7.7 05/05/2021   HGB 14.7 05/05/2021   HCT 45.3 05/05/2021   MCV 89.9 05/05/2021   PLT 340 05/05/2021   No results found for: "IRON", "TIBC", "FERRITIN"  Attestation Statements:   Reviewed by clinician  on day of visit: allergies, medications, problem list, medical history, surgical history, family history, social history, and previous encounter notes.  I, Elnora Morrison, RMA am acting as transcriptionist for Everardo Pacific, FNP.  I have reviewed the above documentation for accuracy and completeness, and I agree with the above. Everardo Pacific, FNP

## 2022-05-28 ENCOUNTER — Encounter (INDEPENDENT_AMBULATORY_CARE_PROVIDER_SITE_OTHER): Payer: Self-pay | Admitting: Bariatrics

## 2022-05-28 ENCOUNTER — Ambulatory Visit (INDEPENDENT_AMBULATORY_CARE_PROVIDER_SITE_OTHER): Payer: BC Managed Care – PPO | Admitting: Bariatrics

## 2022-05-28 VITALS — BP 143/93 | HR 79 | Temp 98.3°F | Ht 63.0 in | Wt 210.0 lb

## 2022-05-28 DIAGNOSIS — Z7985 Long-term (current) use of injectable non-insulin antidiabetic drugs: Secondary | ICD-10-CM

## 2022-05-28 DIAGNOSIS — Z6837 Body mass index (BMI) 37.0-37.9, adult: Secondary | ICD-10-CM

## 2022-05-28 DIAGNOSIS — I10 Essential (primary) hypertension: Secondary | ICD-10-CM | POA: Diagnosis not present

## 2022-05-28 DIAGNOSIS — E669 Obesity, unspecified: Secondary | ICD-10-CM

## 2022-05-28 DIAGNOSIS — E1169 Type 2 diabetes mellitus with other specified complication: Secondary | ICD-10-CM

## 2022-05-28 IMAGING — MG MM DIGITAL SCREENING BILAT W/ TOMO AND CAD
6 of 12 series · 6 of 36 positions shown · non-contrast
Comparison: Previous exam(s).

CLINICAL DATA: Screening.

EXAM:
DIGITAL SCREENING BILATERAL MAMMOGRAM WITH TOMOSYNTHESIS AND CAD
TECHNIQUE: Bilateral screening digital craniocaudal and mediolateral oblique
mammograms were obtained. Bilateral screening digital breast
tomosynthesis was performed. The images were evaluated with
computer-aided detection.

[R CC synth-2D]
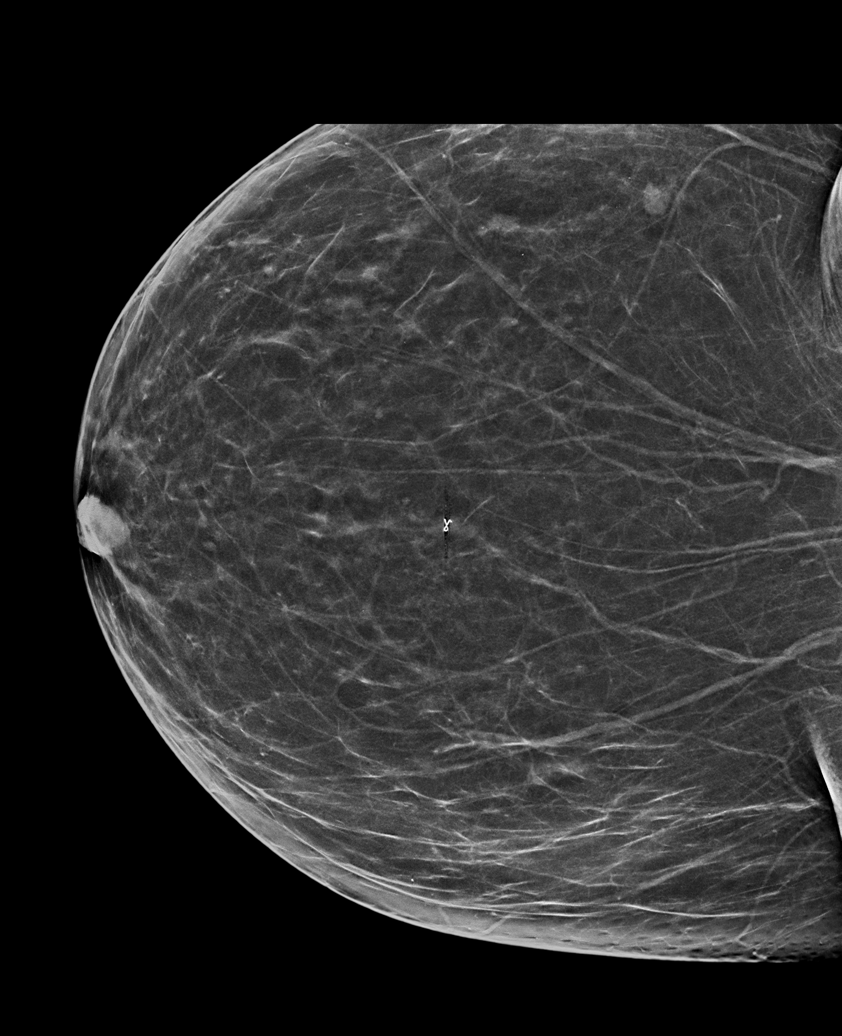

[R MLO synth-2D (1 of 2)]
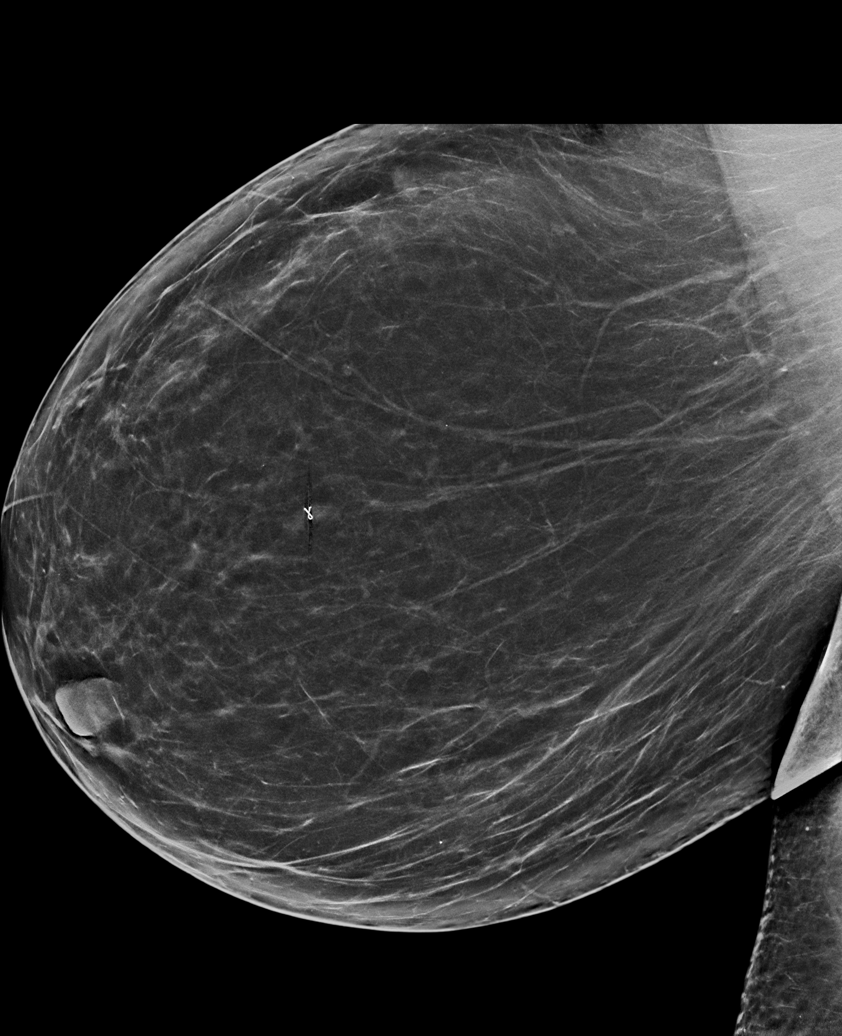

[L MLO synth-2D (1 of 2)]
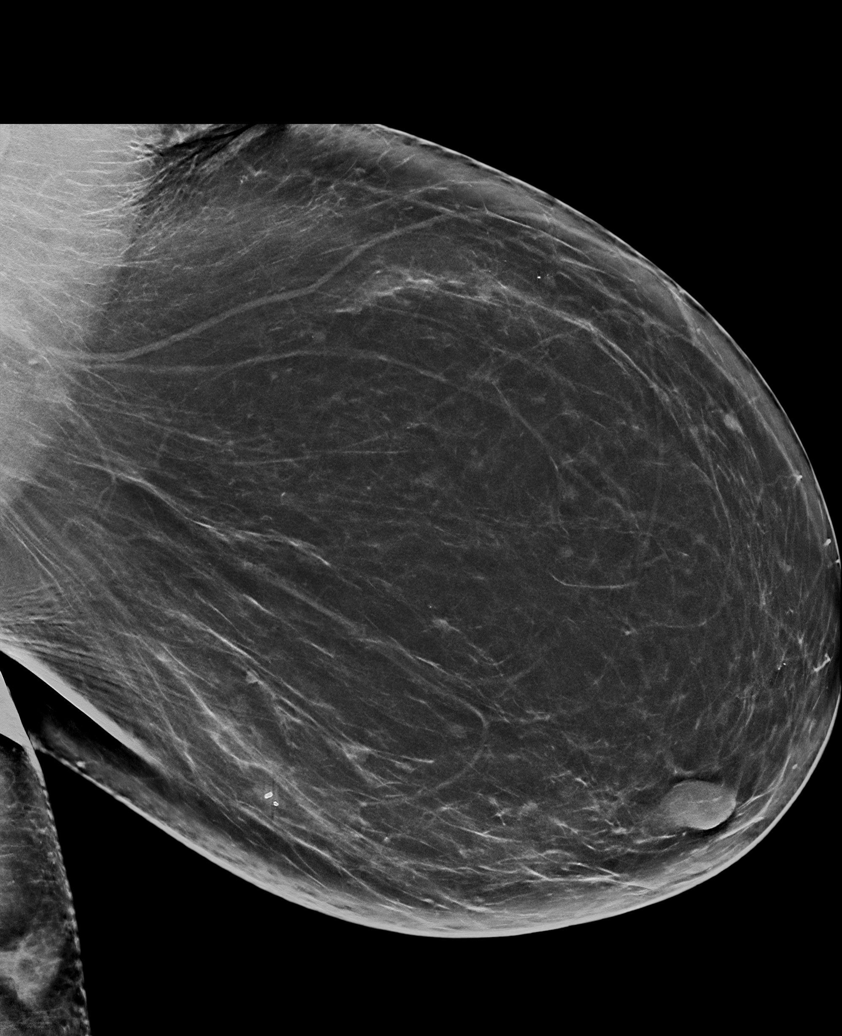

[L CC synth-2D]
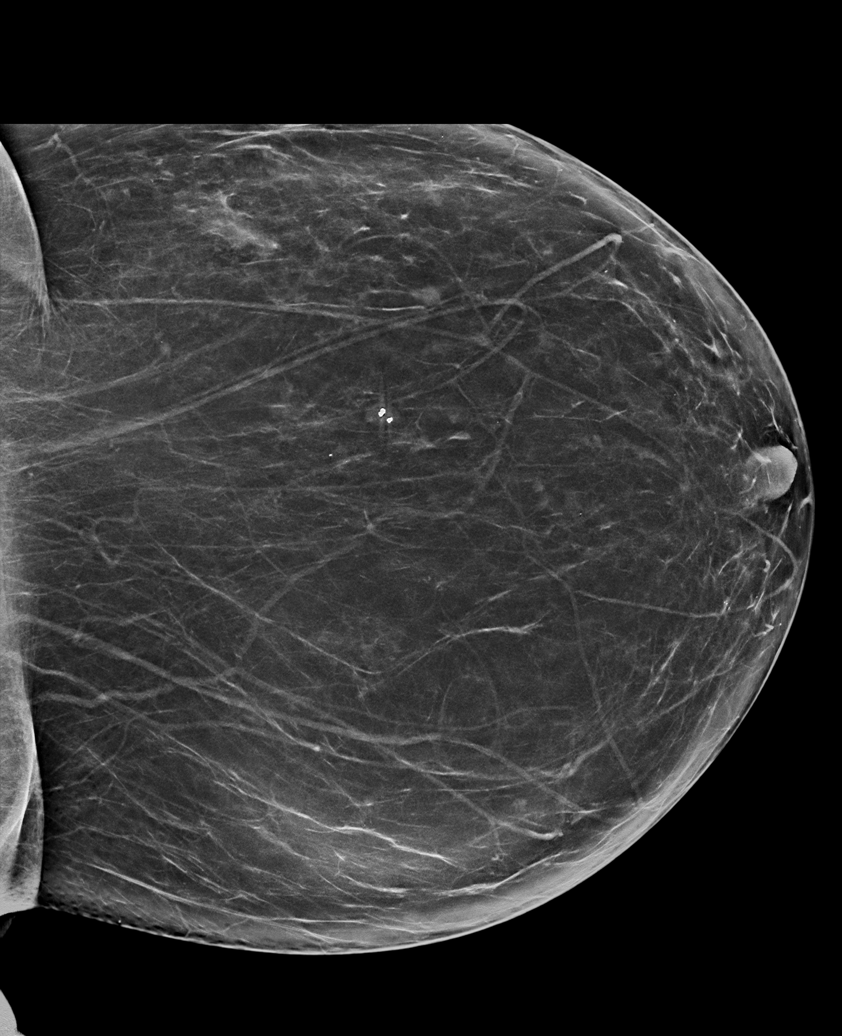

[L MLO synth-2D (2 of 2)]
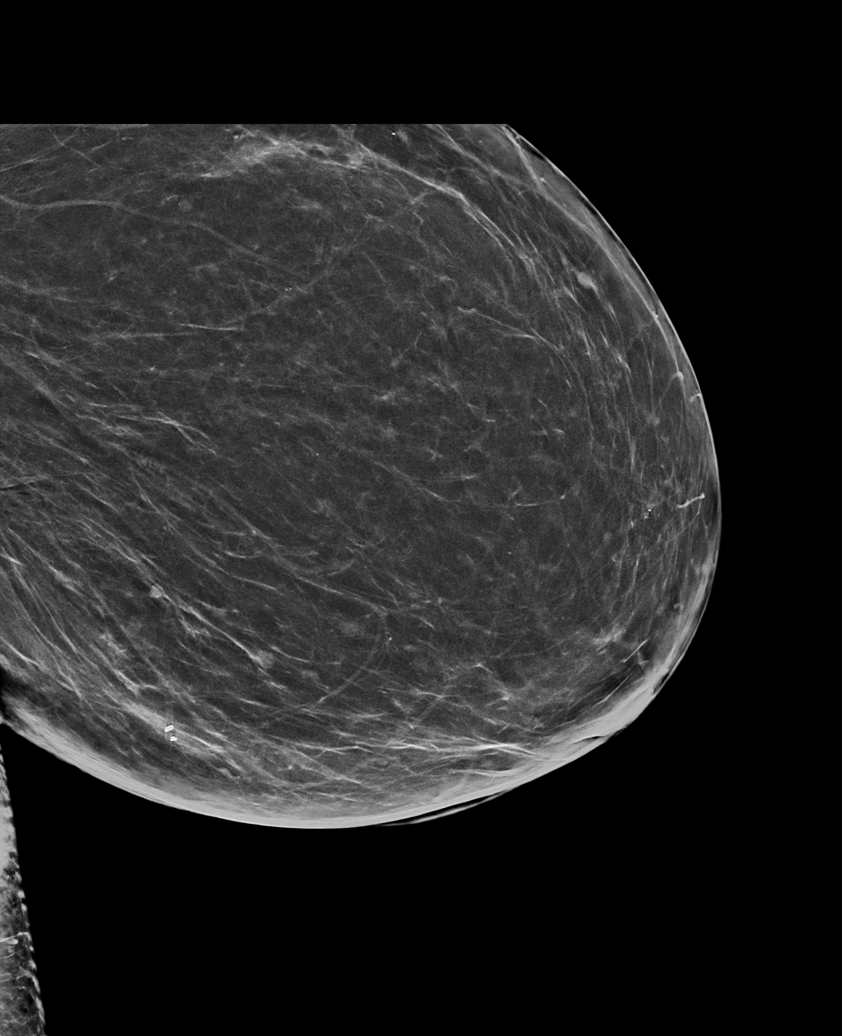

[R MLO synth-2D (2 of 2)]
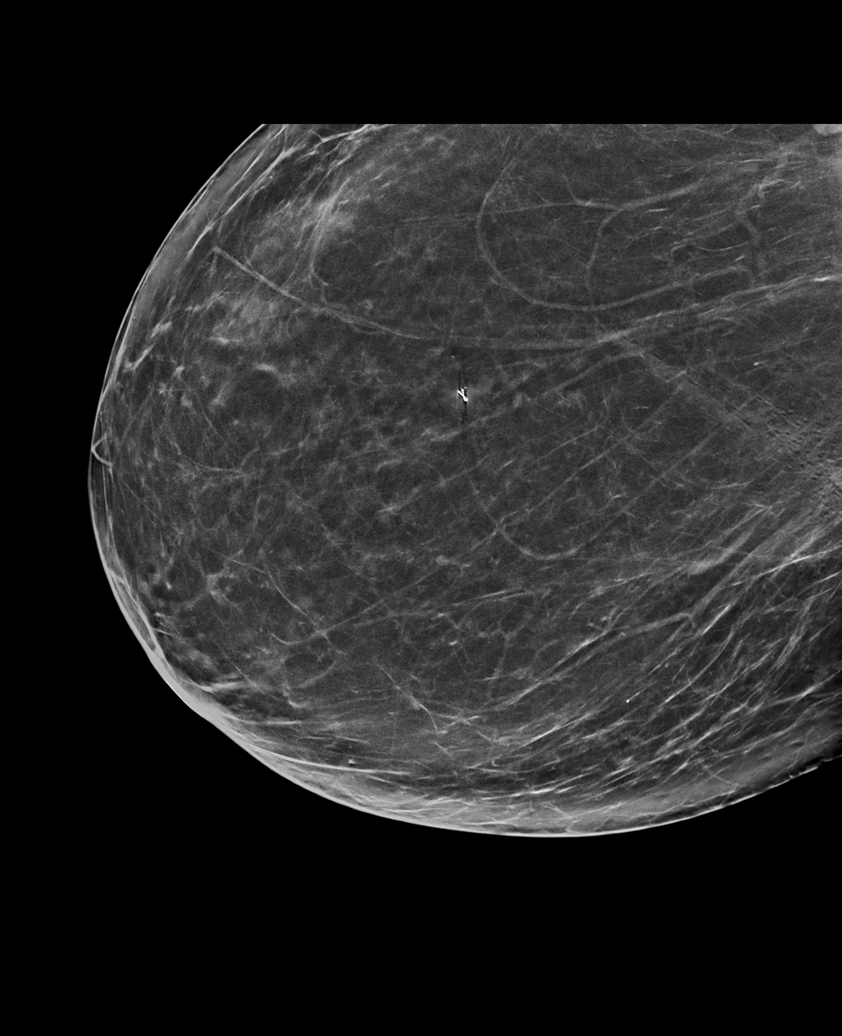

[6 of 36 positions shown; findings below may reference images not displayed]

ACR Breast Density Category b: There are scattered areas of
fibroglandular density.
FINDINGS: There are no findings suspicious for malignancy.
IMPRESSION: No mammographic evidence of malignancy. A result letter of this
screening mammogram will be mailed directly to the patient.

RECOMMENDATION:
Screening mammogram in one year. (Code:51-O-LD2)

BI-RADS CATEGORY  1: Negative.

## 2022-05-28 MED ORDER — OZEMPIC (0.25 OR 0.5 MG/DOSE) 2 MG/3ML ~~LOC~~ SOPN
PEN_INJECTOR | SUBCUTANEOUS | 0 refills | Status: DC
Start: 2022-05-28 — End: 2022-07-09

## 2022-05-28 NOTE — Progress Notes (Unsigned)
Cardiology Office Note  Date: 05/29/2022   ID: Jenna Chavez, DOB 29-Jul-1966, MRN [id number]  PCP:  Redmond School, MD  Cardiologist:  Rozann Lesches, MD Electrophysiologist:  None   Chief Complaint  Patient presents with   Cardiac follow-up    History of Present Illness: Jenna Chavez is a 56 y.o. female last seen in August 2022.  She is here for a routine visit.  Reports doing fairly well overall, no progressive palpitations reported or unusual shortness of breath.  Blood pressure was up this morning, but she had not yet taken her morning medications.  She states that she has been compliant with therapy however and continues to see Dr. Gerarda Fraction.  I personally reviewed her ECG today which shows normal sinus rhythm.  She did have lab work earlier in the year at which point her LDL was 135 on Pravachol.  She is on Ozempic and has lost about 7 pounds, also focusing on her diet more carefully.  Past Medical History:  Diagnosis Date   Anemia    Back pain    Constipation    Degenerative disc disease, cervical    Diabetes (HCC)    Elevated cholesterol    Gastric ulcer 1999   EGD per Dr. Gala Romney, negative for malignancy   GERD (gastroesophageal reflux disease)    Glucose intolerance (impaired glucose tolerance)    History of kidney stones    Hypertension    Palpitations    Pre-diabetes    Renal cyst, left    Sleep apnea    Stomach ulcer    Vitamin D deficiency     Past Surgical History:  Procedure Laterality Date   CHOLECYSTECTOMY     COLONOSCOPY  06/21/2011   RMR: normal rectum, colon, TI, repeat Aug 2017   COLONOSCOPY N/A 04/10/2017   Surgeon: Daneil Dolin, MD; Normal exam, repeat in 5 years.   COLONOSCOPY WITH PROPOFOL N/A 04/12/2022   Procedure: COLONOSCOPY WITH PROPOFOL;  Surgeon: Daneil Dolin, MD;  Location: [facility];  Service: Endoscopy;  Laterality: N/A;  7:30am, asa 2   ESOPHAGOGASTRODUODENOSCOPY  06/21/2011   Planned but not done at time of TCS,  no need   OOPHORECTOMY     and fallopian tube on left side   PARTIAL HYSTERECTOMY     Right toe surgery     ROBOTIC ASSITED PARTIAL NEPHRECTOMY Left 12/10/2019   Procedure: XI ROBOTIC ASSITED PARTIAL NEPHRECTOMY;  Surgeon: Alexis Frock, MD;  Location: [facility];  Service: Urology;  Laterality: Left;  3 HRS   WISDOM TOOTH EXTRACTION      Current Outpatient Medications  Medication Sig Dispense Refill   BORIC ACID EX Place 600 mg vaginally daily as needed (ph balance).     Cholecalciferol (VITAMIN D3) 50 MCG (2000 UT) TABS Take 2,000 Units by mouth daily.     docusate sodium (COLACE) 100 MG capsule Take 100 mg by mouth daily as needed for moderate constipation.     Insulin Pen Needle 31G X 5 MM MISC Take as directed with Ozempic 30 each 0   lidocaine (LIDODERM) 5 % Place 1 patch onto the skin daily. Remove & Discard patch within 12 hours or as directed by MD 30 patch 0   lisinopril (ZESTRIL) 2.5 MG tablet Take 2.5 mg by mouth daily.     methocarbamol (ROBAXIN) 500 MG tablet Take 1 tablet (500 mg total) by mouth 2 (two) times daily. 20 tablet 0   nebivolol (BYSTOLIC) 10 MG tablet  TAKE 1 TABLET BY MOUTH EVERY DAY 90 tablet 3   nebivolol (BYSTOLIC) 5 MG tablet Take 5 mg by mouth daily.     pantoprazole (PROTONIX) 20 MG tablet Take 1 tablet (20 mg total) by mouth daily. 30 tablet 0   polyethylene glycol (MIRALAX / GLYCOLAX) 17 g packet Take 17 g by mouth daily as needed for moderate constipation.     pravastatin (PRAVACHOL) 40 MG tablet Take 40 mg by mouth daily.      Semaglutide,0.25 or 0.'5MG'$ /DOS, (OZEMPIC, 0.25 OR 0.5 MG/DOSE,) 2 MG/3ML SOPN Inject 0.5 mg into the skin weekly 3 mL 0   No current facility-administered medications for this visit.   Allergies:  Hydrocodone and Dilaudid [hydromorphone hcl]   ROS: No orthopnea or PND.  Physical Exam: VS:  BP (!) 142/98 Comment: hasn't taken morning meds yet  Pulse 75   Ht '5\' 6"'$  (1.676 m)   Wt 214 lb 6.4 oz (97.3 kg)   SpO2 100%   BMI  34.61 kg/m , BMI Body mass index is 34.61 kg/m.  Wt Readings from Last 3 Encounters:  05/29/22 214 lb 6.4 oz (97.3 kg)  05/28/22 210 lb (95.3 kg)  05/06/22 214 lb (97.1 kg)    General: Patient appears comfortable at rest. HEENT: Conjunctiva and lids normal, oropharynx clear. Neck: Supple, no elevated JVP or carotid bruits, no thyromegaly. Lungs: Clear to auscultation, nonlabored breathing at rest. Cardiac: Regular rate and rhythm, no S3 or significant systolic murmur, no pericardial rub. Extremities: No pitting edema.  ECG:  An ECG dated 05/05/2021 was personally reviewed today and demonstrated:  Sinus rhythm.  Recent Labwork: 07/03/2021: TSH 0.839 12/31/2021: ALT 11; AST 15; BUN 16; Creatinine, Ser 1.08; Potassium 3.9; Sodium 142     Component Value Date/Time   CHOL 229 (H) 12/31/2021 1213   TRIG 99 12/31/2021 1213   HDL 77 12/31/2021 1213   LDLCALC 135 (H) 12/31/2021 1213    Other Studies Reviewed Today:  Echocardiogram 11/26/2019:  1. Left ventricular ejection fraction, by visual estimation, is 60 to  65%. The left ventricle has normal function. There is mildly increased  left ventricular hypertrophy.   2. The left ventricle has no regional wall motion abnormalities.   3. Global right ventricle has normal systolic function.The right  ventricular size is normal. No increase in right ventricular wall  thickness.   4. Left atrial size was normal.   5. Right atrial size was normal.   6. The mitral valve is normal in structure. Trivial mitral valve  regurgitation.   7. The tricuspid valve is normal in structure.   8. The tricuspid valve is normal in structure. Tricuspid valve  regurgitation is not demonstrated.   9. The aortic valve is tricuspid. Aortic valve regurgitation is not  visualized. No evidence of aortic valve sclerosis or stenosis.  10. The pulmonic valve was normal in structure. Pulmonic valve  regurgitation is trivial.  11. The inferior vena cava is normal in  size with greater than 50%  respiratory variability, suggesting right atrial pressure of 3 mmHg.   Cardiac monitor February 2021: Zio patch reviewed.  13 days 20 hours analyzed.  Predominant rhythm is sinus with heart rate ranging from 49 bpm up to 112 bpm and average heart rate 70 bpm.  Rare PACs and PVCs were noted representing less than 1% of total beats.  There were no sustained arrhythmias or pauses.  Assessment and Plan:  1.  Palpitations, currently without any progressive symptoms and continues on  Bystolic for concurrent treatment of hypertension.  ECG is normal today.  2.  Essential hypertension, on lisinopril and Bystolic with followed by Dr. Gerarda Fraction.  Blood pressure elevated this morning prior to taking medications.  No adjustments were made.  3.  Mixed hyperlipidemia, on Pravachol.  Last LDL 135.  She is also working on weight loss and is currently on Ozempic.  Medication Adjustments/Labs and Tests Ordered: Current medicines are reviewed at length with the patient today.  Concerns regarding medicines are outlined above.   Tests Ordered: Orders Placed This Encounter  Procedures   EKG 12-Lead    Medication Changes: No orders of the defined types were placed in this encounter.   Disposition:  Follow up  1 year.  Signed, Satira Sark, MD, [facility] 05/29/2022 10:13 AM    [place] [facility] [place], [place], [place] [address] Phone: [phone]; Fax: [phone]

## 2022-05-29 ENCOUNTER — Encounter (INDEPENDENT_AMBULATORY_CARE_PROVIDER_SITE_OTHER): Payer: Self-pay

## 2022-05-29 ENCOUNTER — Encounter: Payer: Self-pay | Admitting: Cardiology

## 2022-05-29 ENCOUNTER — Ambulatory Visit (INDEPENDENT_AMBULATORY_CARE_PROVIDER_SITE_OTHER): Payer: BC Managed Care – PPO | Admitting: Cardiology

## 2022-05-29 VITALS — BP 142/98 | HR 75 | Ht 66.0 in | Wt 214.4 lb

## 2022-05-29 DIAGNOSIS — R002 Palpitations: Secondary | ICD-10-CM

## 2022-05-29 DIAGNOSIS — E782 Mixed hyperlipidemia: Secondary | ICD-10-CM

## 2022-05-29 DIAGNOSIS — I10 Essential (primary) hypertension: Secondary | ICD-10-CM

## 2022-05-29 NOTE — Patient Instructions (Signed)
Medication Instructions:  Your physician recommends that you continue on your current medications as directed. Please refer to the Current Medication list given to you today.   Labwork: none  Testing/Procedures: none  Follow-Up:  Your physician recommends that you schedule a follow-up appointment in: 1 year   Any Other Special Instructions Will Be Listed Below (If Applicable).  You will receive a call in about 10 months reminding you to schedule your appointment. If you do not receive this call, please contact our office.  If you need a refill on your cardiac medications before your next appointment, please call your pharmacy.  

## 2022-06-04 NOTE — Progress Notes (Unsigned)
     Chief Complaint:   OBESITY Jenna Chavez is here to discuss her progress with her obesity treatment plan along with follow-up of her obesity related diagnoses. Jenna Chavez is on {MWMwtlossportion/plan2:23431} and states she is following her eating plan approximately ***% of the time. Jenna Chavez states she is *** *** minutes *** times per week.  Today's visit was #: *** Starting weight: *** Starting date: *** Today's weight: *** Today's date: 05/28/2022 Total lbs lost to date: *** Total lbs lost since last in-office visit: ***  Interim History: ***  Subjective:   1. Diabetes mellitus type 2 in obese (HCC) ***  2. Primary hypertension ***  Assessment/Plan:   1. Diabetes mellitus type 2 in obese (HCC) *** - Semaglutide,0.25 or 0.'5MG'$ /DOS, (OZEMPIC, 0.25 OR 0.5 MG/DOSE,) 2 MG/3ML SOPN; Inject 0.5 mg into the skin weekly  Dispense: 3 mL; Refill: 0  2. Primary hypertension ***  3. Obesity, Current BMI 37.3 Jenna Chavez {CHL AMB IS/IS NOT:210130109} currently in the action stage of change. As such, her goal is to {MWMwtloss#1:210800005}. She has agreed to {MWMwtlossportion/plan2:23431}.   Exercise goals: {MWM EXERCISE RECS:23473}  Behavioral modification strategies: {MWMwtlossdietstrategies3:23432}.  Jenna Chavez has agreed to follow-up with our clinic in {NUMBER 1-10:22536} weeks. She was informed of the importance of frequent follow-up visits to maximize her success with intensive lifestyle modifications for her multiple health conditions.   Objective:   Blood pressure (!) 143/93, pulse 79, temperature 98.3 F (36.8 C), height '5\' 3"'$  (1.6 m), weight 210 lb (95.3 kg), SpO2 99 %. Body mass index is 37.2 kg/m.  General: Cooperative, alert, well developed, in no acute distress. HEENT: Conjunctivae and lids unremarkable. Cardiovascular: Regular rhythm.  Lungs: Normal work of breathing. Neurologic: No focal deficits.   Lab Results  Component Value Date   CREATININE 1.08 (H) 12/31/2021   BUN 16  12/31/2021   NA 142 12/31/2021   K 3.9 12/31/2021   CL 106 12/31/2021   CO2 23 12/31/2021   Lab Results  Component Value Date   ALT 11 12/31/2021   AST 15 12/31/2021   ALKPHOS 63 12/31/2021   BILITOT 0.5 12/31/2021   Lab Results  Component Value Date   HGBA1C 6.2 (H) 12/31/2021   HGBA1C 6.9 (H) 07/03/2021   HGBA1C 6.1 (H) 02/07/2020   Lab Results  Component Value Date   INSULIN 19.8 12/31/2021   INSULIN 27.2 (H) 07/03/2021   INSULIN 25.4 (H) 02/07/2020   Lab Results  Component Value Date   TSH 0.839 07/03/2021   Lab Results  Component Value Date   CHOL 229 (H) 12/31/2021   HDL 77 12/31/2021   LDLCALC 135 (H) 12/31/2021   TRIG 99 12/31/2021   Lab Results  Component Value Date   VD25OH 45.1 12/31/2021   VD25OH 11.1 (L) 02/10/2020   VD25OH 12.0 (L) 02/07/2020   Lab Results  Component Value Date   WBC 7.7 05/05/2021   HGB 14.7 05/05/2021   HCT 45.3 05/05/2021   MCV 89.9 05/05/2021   PLT 340 05/05/2021   No results found for: "IRON", "TIBC", "FERRITIN"  Attestation Statements:   Reviewed by clinician on day of visit: allergies, medications, problem list, medical history, surgical history, family history, social history, and previous encounter notes.   Wilhemena Durie, am acting as Location manager for [employer], DO.  I have reviewed the above documentation for accuracy and completeness, and I agree with the above. -  ***

## 2022-06-06 ENCOUNTER — Encounter (INDEPENDENT_AMBULATORY_CARE_PROVIDER_SITE_OTHER): Payer: Self-pay | Admitting: Bariatrics

## 2022-07-09 ENCOUNTER — Ambulatory Visit: Payer: BC Managed Care – PPO | Admitting: Bariatrics

## 2022-07-09 ENCOUNTER — Encounter: Payer: Self-pay | Admitting: Bariatrics

## 2022-07-09 VITALS — BP 146/90 | HR 84 | Temp 97.8°F | Ht 63.0 in | Wt 207.0 lb

## 2022-07-09 DIAGNOSIS — E669 Obesity, unspecified: Secondary | ICD-10-CM | POA: Diagnosis not present

## 2022-07-09 DIAGNOSIS — E1169 Type 2 diabetes mellitus with other specified complication: Secondary | ICD-10-CM | POA: Diagnosis not present

## 2022-07-09 DIAGNOSIS — Z6836 Body mass index (BMI) 36.0-36.9, adult: Secondary | ICD-10-CM | POA: Diagnosis not present

## 2022-07-09 DIAGNOSIS — Z7985 Long-term (current) use of injectable non-insulin antidiabetic drugs: Secondary | ICD-10-CM

## 2022-07-09 DIAGNOSIS — I10 Essential (primary) hypertension: Secondary | ICD-10-CM

## 2022-07-09 MED ORDER — OZEMPIC (0.25 OR 0.5 MG/DOSE) 2 MG/3ML ~~LOC~~ SOPN: PEN_INJECTOR | SUBCUTANEOUS | 0 refills | Status: DC

## 2022-07-10 NOTE — Progress Notes (Unsigned)
Chief Complaint:   OBESITY Jenna Chavez is here to discuss her progress with her obesity treatment plan along with follow-up of her obesity related diagnoses. Jenna Chavez is on the Category 2 Plan and states she is following her eating plan approximately 50% of the time. Jenna Chavez states she is walking for 30 minutes 2 times per week.  Today's visit was #: 46 Starting weight: 223 lbs Starting date: 07/03/2021 Today's weight: 207 lbs Today's date: 07/09/22 Total lbs lost to date: 16 Total lbs lost since last in-office visit: -3  Interim History: She is down 3 pounds since her last visit.   She will lose 8 pounds by her birthday (below 200 pounds).  Subjective:   1. Diabetes mellitus type 2 in obese (HCC) Last A1c was 6.2.  2. Essential hypertension Blood pressure elevated today.  Assessment/Plan:   1. Diabetes mellitus type 2 in obese (HCC) Refill - Semaglutide,0.25 or 0.'5MG'$ /DOS, (OZEMPIC, 0.25 OR 0.5 MG/DOSE,) 2 MG/3ML SOPN; Inject 0.5 mg into the skin weekly  Dispense: 3 mL; Refill: 0  2. Essential hypertension 1.  No added salt. 2.  Increase exercise.  3. Obesity, Current BMI 36.7 1.  Meal planning 2.  Intentional eating 3.  Will keep her water intake high.  Jenna Chavez is currently in the action stage of change. As such, her goal is to continue with weight loss efforts. She has agreed to the Category 2 Plan.   Exercise goals: Membership with [employer] modification strategies: increasing lean protein intake, decreasing simple carbohydrates, increasing vegetables, increasing water intake, decreasing eating out, no skipping meals, meal planning and cooking strategies, keeping healthy foods in the home, and keeping a strict food journal.  Jenna Chavez has agreed to follow-up with our clinic in 4 weeks, fasting. She was informed of the importance of frequent follow-up visits to maximize her success with intensive lifestyle modifications for her multiple health conditions.     Objective:   Blood pressure (!) 146/90, pulse 84, temperature 97.8 F (36.6 C), height '5\' 3"'$  (1.6 m), weight 207 lb (93.9 kg), SpO2 100 %. Body mass index is 36.67 kg/m.  General: Cooperative, alert, well developed, in no acute distress. HEENT: Conjunctivae and lids unremarkable. Cardiovascular: Regular rhythm.  Lungs: Normal work of breathing. Neurologic: No focal deficits.   Lab Results  Component Value Date   CREATININE 1.08 (H) 12/31/2021   BUN 16 12/31/2021   NA 142 12/31/2021   K 3.9 12/31/2021   CL 106 12/31/2021   CO2 23 12/31/2021   Lab Results  Component Value Date   ALT 11 12/31/2021   AST 15 12/31/2021   ALKPHOS 63 12/31/2021   BILITOT 0.5 12/31/2021   Lab Results  Component Value Date   HGBA1C 6.2 (H) 12/31/2021   HGBA1C 6.9 (H) 07/03/2021   HGBA1C 6.1 (H) 02/07/2020   Lab Results  Component Value Date   INSULIN 19.8 12/31/2021   INSULIN 27.2 (H) 07/03/2021   INSULIN 25.4 (H) 02/07/2020   Lab Results  Component Value Date   TSH 0.839 07/03/2021   Lab Results  Component Value Date   CHOL 229 (H) 12/31/2021   HDL 77 12/31/2021   LDLCALC 135 (H) 12/31/2021   TRIG 99 12/31/2021   Lab Results  Component Value Date   VD25OH 45.1 12/31/2021   VD25OH 11.1 (L) 02/10/2020   VD25OH 12.0 (L) 02/07/2020   Lab Results  Component Value Date   WBC 7.7 05/05/2021   HGB 14.7 05/05/2021   HCT  45.3 05/05/2021   MCV 89.9 05/05/2021   PLT 340 05/05/2021   No results found for: "IRON", "TIBC", "FERRITIN"  Attestation Statements:   Reviewed by clinician on day of visit: allergies, medications, problem list, medical history, surgical history, family history, social history, and previous encounter notes.  I, Dawn Whitmire, FNP-C, am acting as transcriptionist for Dr. Jearld Lesch.  I have reviewed the above documentation for accuracy and completeness, and I agree with the above. Jearld Lesch, DO

## 2022-07-11 ENCOUNTER — Encounter: Payer: Self-pay | Admitting: Bariatrics

## 2022-07-15 ENCOUNTER — Telehealth: Payer: Self-pay

## 2022-07-15 NOTE — Telephone Encounter (Signed)
Spoke to patient and notified her that I will reach out to our prior authorization rep to check on PA. Patient verbalized understanding.

## 2022-07-15 NOTE — Telephone Encounter (Signed)
Patient called in and states she went to her pharmacy to pick up her Ozempic and they told her that it would be $467.00. Patient states she was only paying $5. Patient wanted to let Dr. Owens Shark know she didn't pick up the prescription. Patient wonders if a new PA needs to be done.

## 2022-07-16 ENCOUNTER — Encounter (INDEPENDENT_AMBULATORY_CARE_PROVIDER_SITE_OTHER): Payer: Self-pay

## 2022-07-18 ENCOUNTER — Other Ambulatory Visit (INDEPENDENT_AMBULATORY_CARE_PROVIDER_SITE_OTHER): Payer: Self-pay | Admitting: Bariatrics

## 2022-07-18 DIAGNOSIS — E669 Obesity, unspecified: Secondary | ICD-10-CM

## 2022-07-18 MED ORDER — OZEMPIC (0.25 OR 0.5 MG/DOSE) 2 MG/3ML ~~LOC~~ SOPN: PEN_INJECTOR | SUBCUTANEOUS | 0 refills | Status: DC

## 2022-07-23 ENCOUNTER — Telehealth (INDEPENDENT_AMBULATORY_CARE_PROVIDER_SITE_OTHER): Payer: Self-pay | Admitting: Bariatrics

## 2022-07-23 NOTE — Telephone Encounter (Signed)
Notified patient that Dr. Owens Shark sent over 90 day prescription for Ozempic. Patient verbalized understanding.

## 2022-07-23 NOTE — Telephone Encounter (Signed)
Pt called  her insurance says she can get ozempic cover at 100 percent with a 90 day supply. She can also get Mounjaro at 100 percent covered wrote as 90 day supply.She would like Mounjaro. [facility] 

## 2022-08-05 ENCOUNTER — Ambulatory Visit: Payer: BC Managed Care – PPO | Admitting: Bariatrics

## 2022-08-05 ENCOUNTER — Encounter: Payer: Self-pay | Admitting: Bariatrics

## 2022-08-05 VITALS — BP 172/92 | HR 72 | Temp 97.7°F | Ht 63.0 in | Wt 208.0 lb

## 2022-08-05 DIAGNOSIS — E669 Obesity, unspecified: Secondary | ICD-10-CM

## 2022-08-05 DIAGNOSIS — I10 Essential (primary) hypertension: Secondary | ICD-10-CM

## 2022-08-05 DIAGNOSIS — E1169 Type 2 diabetes mellitus with other specified complication: Secondary | ICD-10-CM

## 2022-08-05 DIAGNOSIS — Z7985 Long-term (current) use of injectable non-insulin antidiabetic drugs: Secondary | ICD-10-CM

## 2022-08-05 DIAGNOSIS — Z6836 Body mass index (BMI) 36.0-36.9, adult: Secondary | ICD-10-CM

## 2022-08-05 MED ORDER — LISINOPRIL 5 MG PO TABS: 5.0000 mg | ORAL_TABLET | Freq: Every day | ORAL | 3 refills | Status: DC

## 2022-08-12 NOTE — Progress Notes (Signed)
Chief Complaint:   OBESITY Jenna Chavez is here to discuss her progress with her obesity treatment plan along with follow-up of her obesity related diagnoses. Jenna Chavez is on the Category 2 Plan and states she is following her eating plan approximately 30% of the time. Jenna Chavez states she is doing 0 minutes 0 times per week.  Today's visit was #: 47 Starting weight: 223 lbs Starting date: 07/03/2021 Today's weight: 208 lbs Today's date: 08/05/2022 Total lbs lost to date: 15 Total lbs lost since last in-office visit: 0  Interim History: Jenna Chavez is up 1 lb since her last visit. She is up approximately 1 lb of water weight since her last visit. She states that she has not done well.   Subjective:   1. Diabetes mellitus type 2 in obese [facility]) Jenna Chavez is taking Ozempic.   2. Primary hypertension Jenna Chavez is taking lisinopril and Bystolic. Her blood pressure is elevated and she is taking blood pressure medications. She notes increased stress.   Assessment/Plan:   1. Diabetes mellitus type 2 in obese [facility]) Jenna Chavez will continue Ozempic as directed.   2. Primary hypertension Jenna Chavez agreed to increase lisinopril from 2.5 mg to 5 mg daily. She will continue with her other medications.  - lisinopril (ZESTRIL) 5 MG tablet; Take 1 tablet (5 mg total) by mouth daily.  Dispense: 90 tablet; Refill: 3  3. Obesity, Current BMI 36.9 Jenna Chavez is currently in the action stage of change. As such, her goal is to continue with weight loss efforts. She has agreed to the Category 2 Plan.   She will adhere to the plan 80-90%. She will keep her water intake and protein high.   Exercise goals: No exercise has been prescribed at this time.  Behavioral modification strategies: increasing lean protein intake, decreasing simple carbohydrates, increasing vegetables, increasing water intake, decreasing eating out, no skipping meals, meal planning and cooking strategies, keeping healthy foods in the home, and planning for  success.  Jenna Chavez has agreed to follow-up with our clinic in 4 weeks. She was informed of the importance of frequent follow-up visits to maximize her success with intensive lifestyle modifications for her multiple health conditions.   Objective:   Blood pressure (!) 172/92, pulse 72, temperature 97.7 F (36.5 C), height '5\' 3"'$  (1.6 m), weight 208 lb (94.3 kg), SpO2 99 %. Body mass index is 36.85 kg/m.  General: Cooperative, alert, well developed, in no acute distress. HEENT: Conjunctivae and lids unremarkable. Cardiovascular: Regular rhythm.  Lungs: Normal work of breathing. Neurologic: No focal deficits.   Lab Results  Component Value Date   CREATININE 1.08 (H) 12/31/2021   BUN 16 12/31/2021   NA 142 12/31/2021   K 3.9 12/31/2021   CL 106 12/31/2021   CO2 23 12/31/2021   Lab Results  Component Value Date   ALT 11 12/31/2021   AST 15 12/31/2021   ALKPHOS 63 12/31/2021   BILITOT 0.5 12/31/2021   Lab Results  Component Value Date   HGBA1C 6.2 (H) 12/31/2021   HGBA1C 6.9 (H) 07/03/2021   HGBA1C 6.1 (H) 02/07/2020   Lab Results  Component Value Date   INSULIN 19.8 12/31/2021   INSULIN 27.2 (H) 07/03/2021   INSULIN 25.4 (H) 02/07/2020   Lab Results  Component Value Date   TSH 0.839 07/03/2021   Lab Results  Component Value Date   CHOL 229 (H) 12/31/2021   HDL 77 12/31/2021   LDLCALC 135 (H) 12/31/2021   TRIG 99 12/31/2021   Lab Results  Component Value Date   VD25OH 45.1 12/31/2021   VD25OH 11.1 (L) 02/10/2020   VD25OH 12.0 (L) 02/07/2020   Lab Results  Component Value Date   WBC 7.7 05/05/2021   HGB 14.7 05/05/2021   HCT 45.3 05/05/2021   MCV 89.9 05/05/2021   PLT 340 05/05/2021   No results found for: "IRON", "TIBC", "FERRITIN"  Attestation Statements:   Reviewed by clinician on day of visit: allergies, medications, problem list, medical history, surgical history, family history, social history, and previous encounter notes.   Wilhemena Durie,  am acting as Location manager for [employer], DO.  I have reviewed the above documentation for accuracy and completeness, and I agree with the above. Jearld Lesch, DO

## 2022-08-13 ENCOUNTER — Encounter: Payer: Self-pay | Admitting: Bariatrics

## 2022-09-02 ENCOUNTER — Ambulatory Visit: Payer: BC Managed Care – PPO | Admitting: Bariatrics

## 2022-09-02 ENCOUNTER — Encounter: Payer: Self-pay | Admitting: Bariatrics

## 2022-09-02 VITALS — BP 154/92 | HR 80 | Temp 98.0°F | Ht 63.0 in | Wt 204.0 lb

## 2022-09-02 DIAGNOSIS — E559 Vitamin D deficiency, unspecified: Secondary | ICD-10-CM

## 2022-09-02 DIAGNOSIS — Z7985 Long-term (current) use of injectable non-insulin antidiabetic drugs: Secondary | ICD-10-CM

## 2022-09-02 DIAGNOSIS — Z6831 Body mass index (BMI) 31.0-31.9, adult: Secondary | ICD-10-CM

## 2022-09-02 DIAGNOSIS — Z6834 Body mass index (BMI) 34.0-34.9, adult: Secondary | ICD-10-CM

## 2022-09-02 DIAGNOSIS — E7849 Other hyperlipidemia: Secondary | ICD-10-CM

## 2022-09-02 DIAGNOSIS — E119 Type 2 diabetes mellitus without complications: Secondary | ICD-10-CM

## 2022-09-02 DIAGNOSIS — E1169 Type 2 diabetes mellitus with other specified complication: Secondary | ICD-10-CM

## 2022-09-02 DIAGNOSIS — I10 Essential (primary) hypertension: Secondary | ICD-10-CM | POA: Diagnosis not present

## 2022-09-02 DIAGNOSIS — E669 Obesity, unspecified: Secondary | ICD-10-CM

## 2022-09-02 MED ORDER — TIRZEPATIDE 7.5 MG/0.5ML ~~LOC~~ SOAJ: 7.5000 mg | SUBCUTANEOUS | 0 refills | Status: DC

## 2022-09-04 LAB — COMPREHENSIVE METABOLIC PANEL
ALT: 18 IU/L (ref 0–32)
AST: 13 IU/L (ref 0–40)
Albumin/Globulin Ratio: 1.7 (ref 1.2–2.2)
Albumin: 4.2 g/dL (ref 3.8–4.9)
Alkaline Phosphatase: 60 IU/L (ref 44–121)
BUN/Creatinine Ratio: 12 (ref 9–23)
BUN: 13 mg/dL (ref 6–24)
Bilirubin Total: 0.6 mg/dL (ref 0.0–1.2)
CO2: 23 mmol/L (ref 20–29)
Calcium: 9.8 mg/dL (ref 8.7–10.2)
Chloride: 105 mmol/L (ref 96–106)
Creatinine, Ser: 1.09 mg/dL — ABNORMAL HIGH (ref 0.57–1.00)
Globulin, Total: 2.5 g/dL (ref 1.5–4.5)
Glucose: 85 mg/dL (ref 70–99)
Potassium: 3.9 mmol/L (ref 3.5–5.2)
Sodium: 142 mmol/L (ref 134–144)
Total Protein: 6.7 g/dL (ref 6.0–8.5)
eGFR: 61 mL/min/{1.73_m2} (ref >59)

## 2022-09-04 LAB — LIPID PANEL WITH LDL/HDL RATIO
Cholesterol, Total: 183 mg/dL (ref 100–199)
HDL: 75 mg/dL (ref >39)
LDL Chol Calc (NIH): 91 mg/dL (ref 0–99)
LDL/HDL Ratio: 1.2 ratio (ref 0.0–3.2)
Triglycerides: 97 mg/dL (ref 0–149)
VLDL Cholesterol Cal: 17 mg/dL (ref 5–40)

## 2022-09-04 LAB — HEMOGLOBIN A1C
Est. average glucose Bld gHb Est-mCnc: 123 mg/dL
Hgb A1c MFr Bld: 5.9 % — ABNORMAL HIGH (ref 4.8–5.6)

## 2022-09-04 LAB — INSULIN, RANDOM: INSULIN: 18.1 u[IU]/mL (ref 2.6–24.9)

## 2022-09-04 LAB — VITAMIN D 25 HYDROXY (VIT D DEFICIENCY, FRACTURES): Vit D, 25-Hydroxy: 46.2 ng/mL (ref 30.0–100.0)

## 2022-09-16 ENCOUNTER — Encounter: Payer: Self-pay | Admitting: Bariatrics

## 2022-09-16 NOTE — Progress Notes (Signed)
Chief Complaint:   OBESITY Jenna Chavez is here to discuss her progress with her obesity treatment plan along with follow-up of her obesity related diagnoses. Jenna Chavez is on the Category 2 Plan and states she is following her eating plan approximately 20% of the time. Jenna Chavez states she is doing 0 minutes 0 times per week.  Today's visit was #: 18 Starting weight: 223 lbs Starting date: 07/03/2021 Today's weight: 204 lbs Today's date: 09/02/2022 Total lbs lost to date: 19 Total lbs lost since last in-office visit: 4  Interim History: Jenna Chavez is down 4 lbs since her last visit. She is getting more protein in.   Subjective:   1. Diabetes mellitus type 2 in obese Jenna Chavez) Jenna Chavez will change Ozempic to [facility].  2. Essential hypertension Jenna Chavez's blood pressure is elevated at today at 154/92.  3. Vitamin D deficiency Jenna Chavez is taking vitamin D as directed.  4. Other hyperlipidemia Jenna Chavez is working on decreasing unhealthy fats.  Assessment/Plan:   1. Diabetes mellitus type 2 in obese [facility]) We will check labs today.  Aylani agreed to discontinue Ozempic and start Mounjaro 7.5 mg once weekly, and we will refill for 1 month.  - Hemoglobin A1c - Insulin, random - Comprehensive metabolic panel - tirzepatide (MOUNJARO) 7.5 MG/0.5ML Pen; Inject 7.5 mg into the skin once a week.  Dispense: 6 mL; Refill: 0  2. Essential hypertension We will check labs today.  Mattalyn will check her blood pressure at home.  - Comprehensive metabolic panel  3. Vitamin D deficiency We will check labs today.  Chellie will continue vitamin D as directed.  We will follow-up at her next visit.  - VITAMIN D 25 Hydroxy (Vit-D Deficiency, Fractures)  4. Other hyperlipidemia We will check labs today, and we will follow-up at Jenna Chavez's next visit.  - Lipid Panel With LDL/HDL Ratio - Comprehensive metabolic panel  5. Obesity, Current BMI 36.2 Jenna Chavez is currently in the action stage of change. As such, her goal  is to continue with weight loss efforts. She has agreed to the Category 2 Plan.   Meal planning was discussed.  She will adhere to the plan 80-90%.  Thanksgiving strategies were discussed.  Exercise goals: Be more active.   Behavioral modification strategies: increasing lean protein intake, decreasing simple carbohydrates, increasing vegetables, increasing water intake, decreasing eating out, no skipping meals, meal planning and cooking strategies, keeping healthy foods in the home, and planning for success.  Jenna Chavez has agreed to follow-up with our clinic in 4 weeks. She was informed of the importance of frequent follow-up visits to maximize her success with intensive lifestyle modifications for her multiple health conditions.   Jenna Chavez was informed we would discuss her lab results at her next visit unless there is a critical issue that needs to be addressed sooner. Jenna Chavez agreed to keep her next visit at the agreed upon time to discuss these results.  Objective:   Blood pressure (!) 154/92, pulse 80, temperature 98 F (36.7 C), height '5\' 3"'$  (1.6 m), weight 204 lb (92.5 kg), SpO2 100 %. Body mass index is 36.14 kg/m.  General: Cooperative, alert, well developed, in no acute distress. HEENT: Conjunctivae and lids unremarkable. Cardiovascular: Regular rhythm.  Lungs: Normal work of breathing. Neurologic: No focal deficits.   Lab Results  Component Value Date   CREATININE 1.09 (H) 09/02/2022   BUN 13 09/02/2022   NA 142 09/02/2022   K 3.9 09/02/2022   CL 105 09/02/2022   CO2 23 09/02/2022  Lab Results  Component Value Date   ALT 18 09/02/2022   AST 13 09/02/2022   ALKPHOS 60 09/02/2022   BILITOT 0.6 09/02/2022   Lab Results  Component Value Date   HGBA1C 5.9 (H) 09/02/2022   HGBA1C 6.2 (H) 12/31/2021   HGBA1C 6.9 (H) 07/03/2021   HGBA1C 6.1 (H) 02/07/2020   Lab Results  Component Value Date   INSULIN 18.1 09/02/2022   INSULIN 19.8 12/31/2021   INSULIN 27.2 (H)  07/03/2021   INSULIN 25.4 (H) 02/07/2020   Lab Results  Component Value Date   TSH 0.839 07/03/2021   Lab Results  Component Value Date   CHOL 183 09/02/2022   HDL 75 09/02/2022   LDLCALC 91 09/02/2022   TRIG 97 09/02/2022   Lab Results  Component Value Date   VD25OH 46.2 09/02/2022   VD25OH 45.1 12/31/2021   VD25OH 11.1 (L) 02/10/2020   Lab Results  Component Value Date   WBC 7.7 05/05/2021   HGB 14.7 05/05/2021   HCT 45.3 05/05/2021   MCV 89.9 05/05/2021   PLT 340 05/05/2021   No results found for: "IRON", "TIBC", "FERRITIN"  Attestation Statements:   Reviewed by clinician on day of visit: allergies, medications, problem list, medical history, surgical history, family history, social history, and previous encounter notes.   Wilhemena Durie, am acting as Location manager for [employer], DO.  I have reviewed the above documentation for accuracy and completeness, and I agree with the above. Jearld Lesch, DO

## 2022-10-01 ENCOUNTER — Encounter: Payer: Self-pay | Admitting: Bariatrics

## 2022-10-01 ENCOUNTER — Ambulatory Visit: Payer: BC Managed Care – PPO | Admitting: Bariatrics

## 2022-10-01 VITALS — BP 127/82 | HR 79 | Temp 97.7°F | Ht 63.0 in | Wt 194.0 lb

## 2022-10-01 DIAGNOSIS — I10 Essential (primary) hypertension: Secondary | ICD-10-CM | POA: Diagnosis not present

## 2022-10-01 DIAGNOSIS — E669 Obesity, unspecified: Secondary | ICD-10-CM

## 2022-10-01 DIAGNOSIS — E1169 Type 2 diabetes mellitus with other specified complication: Secondary | ICD-10-CM

## 2022-10-01 DIAGNOSIS — Z6834 Body mass index (BMI) 34.0-34.9, adult: Secondary | ICD-10-CM | POA: Diagnosis not present

## 2022-10-01 DIAGNOSIS — Z7985 Long-term (current) use of injectable non-insulin antidiabetic drugs: Secondary | ICD-10-CM

## 2022-10-02 ENCOUNTER — Telehealth: Payer: Self-pay

## 2022-10-02 NOTE — Telephone Encounter (Signed)
Patient received a bill for her 08/05/22 appt it was $240. Patient states she has only had to pay her copay when she was at the other practice. Can we get this looked at?

## 2022-10-02 NOTE — Telephone Encounter (Signed)
Sent email to billing and coding.

## 2022-10-15 ENCOUNTER — Encounter: Payer: Self-pay | Admitting: Bariatrics

## 2022-10-15 NOTE — Progress Notes (Signed)
Chief Complaint:   OBESITY Jenna Chavez is here to discuss her progress with her obesity treatment plan along with follow-up of her obesity related diagnoses. Jenna Chavez is on the Category 2 Plan and states she is following her eating plan approximately 40% of the time. Wilder states she is doing 0 minutes 0 times per week.  Today's visit was #: 78 Starting weight: 223 lbs Starting date: 07/03/2021 Today's weight: 194 lbs Today's date: 10/01/2022 Total lbs lost to date: 29 Total lbs lost since last in-office visit: 10  Interim History: Jenna Chavez is down another 10 pounds since her last visit, and she is doing well overall.  She is drinking more water.  Subjective:   1. Diabetes mellitus type 2 in obese [facility]) Tori is tolerating Mounjaro well.  Her last A1c was 5.9 and insulin level 18.1.  2. Essential hypertension Mitali's blood pressure is controlled and has improved from her previous visit.  Assessment/Plan:   1. Diabetes mellitus type 2 in obese [facility]) Jenna Chavez will continue [facility], and she will continue to work on minimizing all carbohydrates.  2. Essential hypertension Jenna Chavez will continue her medications, and she will work on eliminating added salt.  3. Obesity, Current BMI 34.4 [name] is currently in the action stage of change. As such, her goal is to continue with weight loss efforts. She has agreed to the Category 2 Plan.   Meal planning was discussed.  Reviewed labs from 09/02/2022, CMP, lipids, vitamin D, A1c, and insulin.  Exercise goals: Hula hoop.  Behavioral modification strategies: increasing lean protein intake, decreasing simple carbohydrates, increasing vegetables, increasing water intake, decreasing eating out, no skipping meals, meal planning and cooking strategies, keeping healthy foods in the home, and planning for success.  Jenna Chavez has agreed to follow-up with our clinic in 4 weeks. She was informed of the importance of frequent follow-up visits to maximize  her success with intensive lifestyle modifications for her multiple health conditions.   Objective:   Blood pressure 127/82, pulse 79, temperature 97.7 F (36.5 C), height '5\' 3"'$  (1.6 m), weight 194 lb (88 kg), SpO2 100 %. Body mass index is 34.37 kg/m.  General: Cooperative, alert, well developed, in no acute distress. HEENT: Conjunctivae and lids unremarkable. Cardiovascular: Regular rhythm.  Lungs: Normal work of breathing. Neurologic: No focal deficits.   Lab Results  Component Value Date   CREATININE 1.09 (H) 09/02/2022   BUN 13 09/02/2022   NA 142 09/02/2022   K 3.9 09/02/2022   CL 105 09/02/2022   CO2 23 09/02/2022   Lab Results  Component Value Date   ALT 18 09/02/2022   AST 13 09/02/2022   ALKPHOS 60 09/02/2022   BILITOT 0.6 09/02/2022   Lab Results  Component Value Date   HGBA1C 5.9 (H) 09/02/2022   HGBA1C 6.2 (H) 12/31/2021   HGBA1C 6.9 (H) 07/03/2021   HGBA1C 6.1 (H) 02/07/2020   Lab Results  Component Value Date   INSULIN 18.1 09/02/2022   INSULIN 19.8 12/31/2021   INSULIN 27.2 (H) 07/03/2021   INSULIN 25.4 (H) 02/07/2020   Lab Results  Component Value Date   TSH 0.839 07/03/2021   Lab Results  Component Value Date   CHOL 183 09/02/2022   HDL 75 09/02/2022   LDLCALC 91 09/02/2022   TRIG 97 09/02/2022   Lab Results  Component Value Date   VD25OH 46.2 09/02/2022   VD25OH 45.1 12/31/2021   VD25OH 11.1 (L) 02/10/2020   Lab Results  Component Value Date  WBC 7.7 05/05/2021   HGB 14.7 05/05/2021   HCT 45.3 05/05/2021   MCV 89.9 05/05/2021   PLT 340 05/05/2021   No results found for: "IRON", "TIBC", "FERRITIN"  Attestation Statements:   Reviewed by clinician on day of visit: allergies, medications, problem list, medical history, surgical history, family history, social history, and previous encounter notes.   Wilhemena Durie, am acting as Location manager for [employer], DO.  I have reviewed the above documentation for accuracy  and completeness, and I agree with the above. Jearld Lesch, DO

## 2022-10-29 ENCOUNTER — Ambulatory Visit: Payer: BC Managed Care – PPO | Admitting: Bariatrics

## 2022-10-31 ENCOUNTER — Ambulatory Visit: Payer: BC Managed Care – PPO | Admitting: Bariatrics

## 2022-10-31 ENCOUNTER — Encounter: Payer: Self-pay | Admitting: Bariatrics

## 2022-10-31 VITALS — BP 124/74 | HR 75 | Temp 97.9°F | Ht 63.0 in | Wt 185.0 lb

## 2022-10-31 DIAGNOSIS — Z7985 Long-term (current) use of injectable non-insulin antidiabetic drugs: Secondary | ICD-10-CM

## 2022-10-31 DIAGNOSIS — I10 Essential (primary) hypertension: Secondary | ICD-10-CM | POA: Diagnosis not present

## 2022-10-31 DIAGNOSIS — E1169 Type 2 diabetes mellitus with other specified complication: Secondary | ICD-10-CM | POA: Diagnosis not present

## 2022-10-31 DIAGNOSIS — E669 Obesity, unspecified: Secondary | ICD-10-CM

## 2022-10-31 DIAGNOSIS — Z6832 Body mass index (BMI) 32.0-32.9, adult: Secondary | ICD-10-CM

## 2022-10-31 DIAGNOSIS — R5383 Other fatigue: Secondary | ICD-10-CM | POA: Diagnosis not present

## 2022-10-31 MED ORDER — TIRZEPATIDE 7.5 MG/0.5ML ~~LOC~~ SOAJ: 7.5000 mg | SUBCUTANEOUS | 0 refills | Status: DC

## 2022-11-11 NOTE — Progress Notes (Signed)
Chief Complaint:   OBESITY Jenna Chavez is here to discuss her progress with her obesity treatment plan along with follow-up of her obesity related diagnoses. Jenna Chavez is on the Category 2 Plan and states she is following her eating plan approximately 50% of the time. Jenna Chavez states she is not currently exercising.  Today's visit was #: 20 Starting weight: 223 lbs Starting date: 07/03/2021 Today's weight: 185 lbs Today's date: 10/31/2022 Total lbs lost to date: 38 Total lbs lost since last in-office visit: 9  Interim History: Jenna Chavez is down 9 lbs since her last visit and doing well overall.  Subjective:   1. Diabetes mellitus type 2 in obese [facility]) [name] is taking Mounjaro.  2. Primary hypertension [name] is taking lisinopril and Bystolic.  3. Other fatigue Malerie reports symptoms some mornings.  Assessment/Plan:   1. Diabetes mellitus type 2 in obese (HCC) Good blood sugar control is important to decrease the likelihood of diabetic complications such as nephropathy, neuropathy, limb loss, blindness, coronary artery disease, and death. Intensive lifestyle modification including diet, exercise and weight loss are the first line of treatment for diabetes.  Continue current treatment plan.  Refill- tirzepatide (MOUNJARO) 7.5 MG/0.5ML Pen; Inject 7.5 mg into the skin once a week.  Dispense: 6 mL; Refill: 0  2. Primary hypertension Jenna Chavez is working on healthy weight loss and exercise to improve blood pressure control. We will watch for signs of hypotension as she continues her lifestyle modifications. Continue medications.  3. Other fatigue Take a multivitamin and B vitamins daily. Increase fiber.  4. Obesity, Current BMI 32.9 Jenna Chavez is currently in the action stage of change. As such, her goal is to continue with weight loss efforts. She has agreed to the Category 2 Plan.   Meal planning. Intentional eating  Exercise goals:  As is  Behavioral modification strategies:  increasing lean protein intake, decreasing simple carbohydrates, increasing vegetables, increasing water intake, decreasing eating out, no skipping meals, meal planning and cooking strategies, keeping healthy foods in the home, and planning for success.  [name] has agreed to follow-up with our clinic in 4 weeks. She was informed of the importance of frequent follow-up visits to maximize her success with intensive lifestyle modifications for her multiple health conditions.   Objective:   Blood pressure 124/74, pulse 75, temperature 97.9 F (36.6 C), height '5\' 3"'$  (1.6 m), weight 185 lb (83.9 kg), SpO2 99 %. Body mass index is 32.77 kg/m.  General: Cooperative, alert, well developed, in no acute distress. HEENT: Conjunctivae and lids unremarkable. Cardiovascular: Regular rhythm.  Lungs: Normal work of breathing. Neurologic: No focal deficits.   Lab Results  Component Value Date   CREATININE 1.09 (H) 09/02/2022   BUN 13 09/02/2022   NA 142 09/02/2022   K 3.9 09/02/2022   CL 105 09/02/2022   CO2 23 09/02/2022   Lab Results  Component Value Date   ALT 18 09/02/2022   AST 13 09/02/2022   ALKPHOS 60 09/02/2022   BILITOT 0.6 09/02/2022   Lab Results  Component Value Date   HGBA1C 5.9 (H) 09/02/2022   HGBA1C 6.2 (H) 12/31/2021   HGBA1C 6.9 (H) 07/03/2021   HGBA1C 6.1 (H) 02/07/2020   Lab Results  Component Value Date   INSULIN 18.1 09/02/2022   INSULIN 19.8 12/31/2021   INSULIN 27.2 (H) 07/03/2021   INSULIN 25.4 (H) 02/07/2020   Lab Results  Component Value Date   TSH 0.839 07/03/2021   Lab Results  Component Value Date  CHOL 183 09/02/2022   HDL 75 09/02/2022   LDLCALC 91 09/02/2022   TRIG 97 09/02/2022   Lab Results  Component Value Date   VD25OH 46.2 09/02/2022   VD25OH 45.1 12/31/2021   VD25OH 11.1 (L) 02/10/2020   Lab Results  Component Value Date   WBC 7.7 05/05/2021   HGB 14.7 05/05/2021   HCT 45.3 05/05/2021   MCV 89.9 05/05/2021   PLT 340  05/05/2021    Attestation Statements:   Reviewed by clinician on day of visit: allergies, medications, problem list, medical history, surgical history, family history, social history, and previous encounter notes.  I, Kathlene November, BS, CMA, am acting as transcriptionist for [employer], DO.  I have reviewed the above documentation for accuracy and completeness, and I agree with the above. Jearld Lesch, DO

## 2022-11-12 ENCOUNTER — Encounter: Payer: Self-pay | Admitting: Bariatrics

## 2022-11-18 NOTE — Telephone Encounter (Signed)
Claims have been processed back through insurance.

## 2022-12-03 ENCOUNTER — Ambulatory Visit: Payer: BC Managed Care – PPO | Admitting: Bariatrics

## 2022-12-03 ENCOUNTER — Encounter: Payer: Self-pay | Admitting: Bariatrics

## 2022-12-03 VITALS — BP 124/74 | HR 85 | Temp 98.0°F | Ht 63.0 in | Wt 176.0 lb

## 2022-12-03 DIAGNOSIS — E1169 Type 2 diabetes mellitus with other specified complication: Secondary | ICD-10-CM | POA: Diagnosis not present

## 2022-12-03 DIAGNOSIS — Z6831 Body mass index (BMI) 31.0-31.9, adult: Secondary | ICD-10-CM

## 2022-12-03 DIAGNOSIS — E669 Obesity, unspecified: Secondary | ICD-10-CM

## 2022-12-03 DIAGNOSIS — I10 Essential (primary) hypertension: Secondary | ICD-10-CM | POA: Diagnosis not present

## 2022-12-03 DIAGNOSIS — Z7985 Long-term (current) use of injectable non-insulin antidiabetic drugs: Secondary | ICD-10-CM

## 2022-12-09 ENCOUNTER — Emergency Department (HOSPITAL_COMMUNITY): Payer: BC Managed Care – PPO

## 2022-12-09 ENCOUNTER — Other Ambulatory Visit: Payer: Self-pay

## 2022-12-09 ENCOUNTER — Encounter (HOSPITAL_COMMUNITY): Payer: Self-pay | Admitting: Emergency Medicine

## 2022-12-09 ENCOUNTER — Emergency Department (HOSPITAL_COMMUNITY)
Admission: EM | Admit: 2022-12-09 | Discharge: 2022-12-10 | Disposition: A | Discharge status: Home / Self Care | Payer: BC Managed Care – PPO | Attending: Student | Admitting: Student

## 2022-12-09 DIAGNOSIS — I10 Essential (primary) hypertension: Secondary | ICD-10-CM | POA: Diagnosis not present

## 2022-12-09 DIAGNOSIS — Z794 Long term (current) use of insulin: Secondary | ICD-10-CM | POA: Insufficient documentation

## 2022-12-09 DIAGNOSIS — Z79899 Other long term (current) drug therapy: Secondary | ICD-10-CM | POA: Diagnosis not present

## 2022-12-09 DIAGNOSIS — R0789 Other chest pain: Secondary | ICD-10-CM | POA: Diagnosis not present

## 2022-12-09 DIAGNOSIS — E1165 Type 2 diabetes mellitus with hyperglycemia: Secondary | ICD-10-CM | POA: Diagnosis not present

## 2022-12-09 DIAGNOSIS — K862 Cyst of pancreas: Secondary | ICD-10-CM

## 2022-12-09 DIAGNOSIS — R1084 Generalized abdominal pain: Secondary | ICD-10-CM

## 2022-12-09 LAB — CBC
HCT: 39.3 % (ref 36.0–46.0)
Hemoglobin: 13.2 g/dL (ref 12.0–15.0)
MCH: 29.3 pg (ref 26.0–34.0)
MCHC: 33.6 g/dL (ref 30.0–36.0)
MCV: 87.1 fL (ref 80.0–100.0)
Platelets: 307 10*3/uL (ref 150–400)
RBC: 4.51 MIL/uL (ref 3.87–5.11)
RDW: 14.6 % (ref 11.5–15.5)
WBC: 6.8 10*3/uL (ref 4.0–10.5)
nRBC: 0 % (ref 0.0–0.2)

## 2022-12-09 LAB — COMPREHENSIVE METABOLIC PANEL
ALT: 23 U/L (ref 0–44)
AST: 26 U/L (ref 15–41)
Albumin: 4 g/dL (ref 3.5–5.0)
Alkaline Phosphatase: 44 U/L (ref 38–126)
Anion gap: 10 (ref 5–15)
BUN: 16 mg/dL (ref 6–20)
CO2: 22 mmol/L (ref 22–32)
Calcium: 9.8 mg/dL (ref 8.9–10.3)
Chloride: 106 mmol/L (ref 98–111)
Creatinine, Ser: 1.33 mg/dL — ABNORMAL HIGH (ref 0.44–1.00)
GFR, Estimated: 48 mL/min — ABNORMAL LOW (ref >60)
Glucose, Bld: 170 mg/dL — ABNORMAL HIGH (ref 70–99)
Potassium: 3.1 mmol/L — ABNORMAL LOW (ref 3.5–5.1)
Sodium: 138 mmol/L (ref 135–145)
Total Bilirubin: 0.4 mg/dL (ref 0.3–1.2)
Total Protein: 7.1 g/dL (ref 6.5–8.1)

## 2022-12-09 LAB — CBG MONITORING, ED: Glucose-Capillary: 108 mg/dL — ABNORMAL HIGH (ref 70–99)

## 2022-12-09 LAB — TROPONIN I (HIGH SENSITIVITY)
Troponin I (High Sensitivity): <2 ng/L (ref <18)
Troponin I (High Sensitivity): <2 ng/L (ref <18)

## 2022-12-09 MED ORDER — POTASSIUM CHLORIDE CRYS ER 20 MEQ PO TBCR
40.0000 meq | EXTENDED_RELEASE_TABLET | Freq: Once | ORAL | Status: AC
  Administered 2022-12-09: 40 meq via ORAL
  Filled 2022-12-09: qty 2

## 2022-12-09 MED ORDER — ONDANSETRON HCL 4 MG/2ML IJ SOLN
4.0000 mg | Freq: Once | INTRAMUSCULAR | Status: AC
  Administered 2022-12-09: 4 mg via INTRAVENOUS
  Filled 2022-12-09: qty 2

## 2022-12-09 MED ORDER — MORPHINE SULFATE (PF) 4 MG/ML IV SOLN
4.0000 mg | Freq: Once | INTRAVENOUS | Status: AC
  Administered 2022-12-09: 4 mg via INTRAVENOUS
  Filled 2022-12-09: qty 1

## 2022-12-09 MED ORDER — IOHEXOL 300 MG/ML  SOLN
100.0000 mL | Freq: Once | INTRAMUSCULAR | Status: AC | PRN
Start: 2022-12-09 — End: 2022-12-09
  Administered 2022-12-09: 100 mL via INTRAVENOUS

## 2022-12-09 MED ORDER — SODIUM CHLORIDE 0.9 % IV BOLUS
1000.0000 mL | Freq: Once | INTRAVENOUS | Status: AC
  Administered 2022-12-09: 1000 mL via INTRAVENOUS

## 2022-12-09 MED ORDER — ALUM & MAG HYDROXIDE-SIMETH 200-200-20 MG/5ML PO SUSP
30.0000 mL | Freq: Once | ORAL | Status: AC
Start: 2022-12-09 — End: 2022-12-09
  Administered 2022-12-09: 30 mL via ORAL
  Filled 2022-12-09: qty 30

## 2022-12-09 NOTE — ED Notes (Signed)
Patient transported to CT 

## 2022-12-09 NOTE — ED Triage Notes (Signed)
Pt c/o abd pain earlier today and now having epigastric pain.

## 2022-12-09 NOTE — ED Provider Notes (Signed)
[facility] Provider Note   CSN: [id number] Arrival date & time: 12/09/22  2112     History  Chief Complaint  Patient presents with   Chest Pain    Jenna Chavez is a 57 y.o. female with a history of hypertension, diabetes, GERD with history of peptic ulcer, also history of kidney stones, surgical history significant for cholecystectomy, partial hysterectomy presenting for evaluation of abdominal and chest pain.  She was resting at home when around 7:30 PM she developed generalized cramping waxing and waning abdominal pain describing a pressure sensation which intermittently radiates into her chest.  She denies shortness of breath, palpitations, fevers or chills, she also denies changes in bowels, constipation or diarrhea.  She does have nausea without emesis.  She has noted increased urinary frequency.  She does have a history of kidney stones but denies flank or back pain.  Her pain is intermittently severe.  It is better with rest and worse with movement.  She has had no treatments nor she found any alleviators prior to arrival.  She has been compliant with her home medications, denies any worsening of her acid reflux symptoms, currently on Protonix.  The history is provided by the patient.       Home Medications Prior to Admission medications   Medication Sig Start Date End Date Taking? Authorizing Provider  BORIC ACID EX Place 600 mg vaginally daily as needed (ph balance).    [provider]  Cholecalciferol (VITAMIN D3) 50 MCG (2000 UT) TABS Take 2,000 Units by mouth daily.    [provider]  docusate sodium (COLACE) 100 MG capsule Take 100 mg by mouth daily as needed for moderate constipation.    [provider]  Insulin Pen Needle 31G X 5 MM MISC Take as directed with Ozempic 05/06/22   Tickerhoff, Colletta [place], FNP  lidocaine (LIDODERM) 5 % Place 1 patch onto the skin daily. Remove & Discard patch within 12  hours or as directed by MD 11/17/20   Henderly, [name] A, PA-C  lisinopril (ZESTRIL) 5 MG tablet Take 1 tablet (5 mg total) by mouth daily. 08/05/22   Jearld Lesch A, DO  methocarbamol (ROBAXIN) 500 MG tablet Take 1 tablet (500 mg total) by mouth 2 (two) times daily. 11/17/20   Henderly, [name] A, PA-C  nebivolol (BYSTOLIC) 10 MG tablet TAKE 1 TABLET BY MOUTH EVERY DAY 07/30/21   Satira Sark, MD  nebivolol (BYSTOLIC) 5 MG tablet Take 5 mg by mouth daily.    [provider]  pantoprazole (PROTONIX) 20 MG tablet Take 1 tablet (20 mg total) by mouth daily. 05/05/21   Fransico Meadow, PA-C  polyethylene glycol (MIRALAX / GLYCOLAX) 17 g packet Take 17 g by mouth daily as needed for moderate constipation.    [provider]  pravastatin (PRAVACHOL) 40 MG tablet Take 40 mg by mouth daily.  08/20/19   [provider]  tirzepatide Darcel Bayley) 7.5 MG/0.5ML Pen Inject 7.5 mg into the skin once a week. 10/31/22   Georgia Lopes, DO      Allergies    Hydrocodone and Dilaudid [hydromorphone hcl]    Review of Systems   Review of Systems  Constitutional:  Negative for chills and fever.  HENT:  Negative for congestion and sore throat.   Eyes: Negative.   Respiratory:  Negative for shortness of breath.   Cardiovascular:  Positive for chest pain.  Gastrointestinal:  Positive for abdominal pain and nausea.  Negative for vomiting.  Genitourinary:  Positive for frequency.  Musculoskeletal:  Negative for arthralgias, joint swelling and neck pain.  Skin: Negative.  Negative for rash and wound.  Neurological:  Negative for dizziness, weakness, light-headedness, numbness and headaches.  Psychiatric/Behavioral: Negative.      Physical Exam Updated Vital Signs BP (!) 156/78   Pulse 95   Temp 98 F (36.7 C) (Oral)   Resp (!) 22   Ht 5' 6"$  (1.676 m)   Wt 79.4 kg   SpO2 100%   BMI 28.25 kg/m  Physical Exam Vitals and nursing note reviewed.  Constitutional:      Appearance:  She is well-developed.  HENT:     Head: Normocephalic and atraumatic.  Eyes:     Conjunctiva/sclera: Conjunctivae normal.  Cardiovascular:     Rate and Rhythm: Normal rate and regular rhythm.     Heart sounds: Normal heart sounds.  Pulmonary:     Effort: Pulmonary effort is normal.     Breath sounds: Normal breath sounds. No wheezing.  Abdominal:     General: Bowel sounds are normal.     Palpations: Abdomen is soft.     Tenderness: There is abdominal tenderness in the epigastric area. There is no guarding or rebound.  Musculoskeletal:        General: Normal range of motion.     Cervical back: Normal range of motion.  Skin:    General: Skin is warm and dry.  Neurological:     Mental Status: She is alert.     ED Results / Procedures / Treatments   Labs (all labs ordered are listed, but only abnormal results are displayed) Labs Reviewed  COMPREHENSIVE METABOLIC PANEL - Abnormal; Notable for the following components:      Result Value   Potassium 3.1 (*)    Glucose, Bld 170 (*)    Creatinine, Ser 1.33 (*)    GFR, Estimated 48 (*)    All other components within normal limits  CBG MONITORING, ED - Abnormal; Notable for the following components:   Glucose-Capillary 108 (*)    All other components within normal limits  CBC  CBG MONITORING, ED  TROPONIN I (HIGH SENSITIVITY)  TROPONIN I (HIGH SENSITIVITY)    EKG None  Radiology DG Chest 2 View  Result Date: 12/09/2022 CLINICAL DATA:  Chest pain EXAM: CHEST - 2 VIEW COMPARISON:  Chest radiograph dated May 05, 2021 FINDINGS: The heart size and mediastinal contours are within normal limits. Both lungs are clear. The visualized skeletal structures are unremarkable. IMPRESSION: No active cardiopulmonary disease. Electronically Signed   By: Keane Police D.O.   On: 12/09/2022 21:52    Procedures Procedures    Medications Ordered in ED Medications  potassium chloride SA (KLOR-CON M) CR tablet 40 mEq (has no administration in  time range)  alum & mag hydroxide-simeth (MAALOX/MYLANTA) 200-200-20 MG/5ML suspension 30 mL (has no administration in time range)  sodium chloride 0.9 % bolus 1,000 mL (has no administration in time range)  ondansetron (ZOFRAN) injection 4 mg (4 mg Intravenous Given 12/09/22 2318)  morphine (PF) 4 MG/ML injection 4 mg (4 mg Intravenous Given 12/09/22 2319)  iohexol (OMNIPAQUE) 300 MG/ML solution 100 mL (100 mLs Intravenous Contrast Given 12/09/22 2331)    ED Course/ Medical Decision Making/ A&P                             Medical Decision Making Patient with abdominal  pain which radiates into her chest starting around 730 this evening.  Persistent, waxing and waning in character.  Her current labs are reassuring, she has a normal WBC count, her initial troponin is less than 2, currently pending delta troponin.  C-Met is also unremarkable except for potassium of 3.1, oral replacement has been ordered.  She also has a creatinine of 1.33 which is a little higher than her normal.  IV fluids have been ordered.  Pending CT scan at this time.  Patient discussed with Dr. Waverly Ferrari who assumes care.  Amount and/or Complexity of Data Reviewed Labs: ordered. Radiology: ordered.  Risk Prescription drug management.           Final Clinical Impression(s) / ED Diagnoses Final diagnoses:  None    Rx / DC Orders ED Discharge Orders     None         Landis Martins 12/09/22 2334    Orpah Greek, MD 12/10/22 [phone]

## 2022-12-10 ENCOUNTER — Other Ambulatory Visit (HOSPITAL_COMMUNITY): Payer: Self-pay | Admitting: Radiology

## 2022-12-10 ENCOUNTER — Emergency Department (HOSPITAL_COMMUNITY): Payer: BC Managed Care – PPO

## 2022-12-10 MED ORDER — GADOBUTROL 1 MMOL/ML IV SOLN
7.0000 mL | Freq: Once | INTRAVENOUS | Status: AC | PRN
  Administered 2022-12-10: 7 mL via INTRAVENOUS

## 2022-12-10 MED ORDER — PANTOPRAZOLE SODIUM 20 MG PO TBEC: 20.0000 mg | DELAYED_RELEASE_TABLET | Freq: Every day | ORAL | 0 refills | Status: DC

## 2022-12-10 MED ORDER — OXYCODONE-ACETAMINOPHEN 5-325 MG PO TABS: 1.0000 | ORAL_TABLET | Freq: Four times a day (QID) | ORAL | 0 refills | Status: DC | PRN

## 2022-12-10 MED ORDER — MIDAZOLAM HCL 5 MG/5ML IJ SOLN
2.0000 mg | Freq: Once | INTRAMUSCULAR | Status: AC | PRN
  Administered 2022-12-10: 2 mg via INTRAVENOUS
  Filled 2022-12-10: qty 5

## 2022-12-10 MED ORDER — KETOROLAC TROMETHAMINE 30 MG/ML IJ SOLN
15.0000 mg | Freq: Once | INTRAMUSCULAR | Status: AC
  Administered 2022-12-10: 15 mg via INTRAVENOUS
  Filled 2022-12-10: qty 1

## 2022-12-10 MED ORDER — LIDOCAINE VISCOUS HCL 2 % MT SOLN
15.0000 mL | Freq: Once | OROMUCOSAL | Status: AC
  Administered 2022-12-10: 15 mL via ORAL
  Filled 2022-12-10: qty 15

## 2022-12-10 MED ORDER — MORPHINE SULFATE (PF) 4 MG/ML IV SOLN
6.0000 mg | Freq: Once | INTRAVENOUS | Status: AC
  Administered 2022-12-10: 6 mg via INTRAVENOUS
  Filled 2022-12-10: qty 2

## 2022-12-10 MED ORDER — ALUM & MAG HYDROXIDE-SIMETH 200-200-20 MG/5ML PO SUSP
30.0000 mL | Freq: Once | ORAL | Status: AC
  Administered 2022-12-10: 30 mL via ORAL
  Filled 2022-12-10: qty 30

## 2022-12-10 NOTE — ED Notes (Signed)
Pt unhooked from monitoring equipment and ambulated to the bathroom with standby assist.

## 2022-12-10 NOTE — ED Provider Notes (Signed)
Signout from Dr. Waverly Ferrari.  57 year old female here with abdominal and chest pain.  Cardiac workup has been unremarkable.  CT showing enlarged bile duct.  She is pending MRCP.  If no acute findings plan is for discharge with gastritis treatment and outpatient follow-up. Physical Exam  BP (!) 154/89   Pulse 90   Temp 98.3 F (36.8 C) (Oral)   Resp 12   Ht 5' 6"$  (1.676 m)   Wt 79.4 kg   SpO2 100%   BMI 28.25 kg/m   Physical Exam  Procedures  Procedures  ED Course / MDM    Medical Decision Making Amount and/or Complexity of Data Reviewed Labs: ordered. Radiology: ordered.  Risk OTC drugs. Prescription drug management.   Reviewed the results of MRCP with patient.  She is still complaining of pain in her upper abdomen rating up into her chest and shoulders.  Have ordered a GI cocktail.  Patient still having pain intermittently in her upper abdomen through her back and up into her chest.  She has not been responsive to any typical treatment.  She is comfortable plan for discharge will prescribe her some pain medication which she says she is tolerated in the past although it is listed as an allergy.  Will start her on PPI.  Recommended close follow-up with PCP and return instructions discussed.      Hayden Rasmussen, MD 12/10/22 [phone]

## 2022-12-10 NOTE — ED Notes (Signed)
ED Provider at bedside. 

## 2022-12-10 NOTE — Discharge Instructions (Signed)
You were seen in the emergency department for pain in your upper abdomen radiating into your chest and back.  You had blood work chest x-ray CT abdomen and pelvis MRI abdomen without an obvious explanation for your symptoms.  We are prescribing some acid medication and pain medication.  Will be important for you to follow-up with your primary care doctor.  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  Your primary care doctor can help you with this.  Return to the emergency department if any worsening or concerning symptoms

## 2022-12-17 NOTE — Progress Notes (Unsigned)
Chief Complaint:   OBESITY Special is here to discuss her progress with her obesity treatment plan along with follow-up of her obesity related diagnoses. Jenna Chavez is on the Category 2 plan and states she is following her eating plan approximately 50% of the time. Jenna Chavez states she has not been exercising.  Today's visit was #: 21 Starting weight: 223 lbs Starting date: 07/03/21 Today's weight: 176 lbs Today's date: 12/03/22 Total lbs lost to date: 29 Total lbs lost since last in-office visit: -9  Interim History: She is down another 9 pounds since her last visit.  She states that she feels well.  Subjective:   1. Diabetes mellitus type 2 in obese ([place]) Taking Mounjaro.  Appetite low-normal.  Keeping carbohydrates to a minimum.  2. Primary hypertension Taking nebivolol, lisinopril. BP controlled.  Assessment/Plan:   1. Diabetes mellitus type 2 in obese ([place]) 1.  Continue Mounjaro. 2.  When eating out half the portion and bring home the rest.  2. Primary hypertension 1.  Continue medications. 2.  No added salt.  3. Generalized obesity     BMI 31.0-31.9,adult 1.  Meal planning 2.  Intentional eating  Jenna Chavez is currently in the action stage of change. As such, her goal is to continue with weight loss efforts. She has agreed to the Category 2 plan.  Exercise goals: All adults should avoid inactivity. Some physical activity is better than none, and adults who participate in any amount of physical activity gain some health benefits.  Behavioral modification strategies: increasing lean protein intake, decreasing simple carbohydrates, increasing vegetables, increasing water intake, decreasing eating out, no skipping meals, meal planning and cooking strategies, keeping healthy foods in the home, and planning for success.  Jenna Chavez has agreed to follow-up with our clinic in 4 weeks. She was informed of the importance of frequent follow-up visits to maximize her success with  intensive lifestyle modifications for her multiple health conditions.    Objective:   Blood pressure 124/74, pulse 85, temperature 98 F (36.7 C), height '5\' 3"'$  (1.6 m), weight 176 lb (79.8 kg), SpO2 99 %. Body mass index is 31.18 kg/m.  General: Cooperative, alert, well developed, in no acute distress. HEENT: Conjunctivae and lids unremarkable. Cardiovascular: Regular rhythm.  Lungs: Normal work of breathing. Neurologic: No focal deficits.   Lab Results  Component Value Date   CREATININE 1.33 (H) 12/09/2022   BUN 16 12/09/2022   NA 138 12/09/2022   K 3.1 (L) 12/09/2022   CL 106 12/09/2022   CO2 22 12/09/2022   Lab Results  Component Value Date   ALT 23 12/09/2022   AST 26 12/09/2022   ALKPHOS 44 12/09/2022   BILITOT 0.4 12/09/2022   Lab Results  Component Value Date   HGBA1C 5.9 (H) 09/02/2022   HGBA1C 6.2 (H) 12/31/2021   HGBA1C 6.9 (H) 07/03/2021   HGBA1C 6.1 (H) 02/07/2020   Lab Results  Component Value Date   INSULIN 18.1 09/02/2022   INSULIN 19.8 12/31/2021   INSULIN 27.2 (H) 07/03/2021   INSULIN 25.4 (H) 02/07/2020   Lab Results  Component Value Date   TSH 0.839 07/03/2021   Lab Results  Component Value Date   CHOL 183 09/02/2022   HDL 75 09/02/2022   LDLCALC 91 09/02/2022   TRIG 97 09/02/2022   Lab Results  Component Value Date   VD25OH 46.2 09/02/2022   VD25OH 45.1 12/31/2021   VD25OH 11.1 (L) 02/10/2020   Lab Results  Component Value Date  WBC 6.8 12/09/2022   HGB 13.2 12/09/2022   HCT 39.3 12/09/2022   MCV 87.1 12/09/2022   PLT 307 12/09/2022   No results found for: "IRON", "TIBC", "FERRITIN"  Attestation Statements:   Reviewed by clinician on day of visit: allergies, medications, problem list, medical history, surgical history, family history, social history, and previous encounter notes.  I, Dawn Whitmire, FNP-C, am acting as transcriptionist for Dr. Jearld Lesch.  I have reviewed the above documentation for accuracy and  completeness, and I agree with the above. Jearld Lesch, DO

## 2022-12-27 ENCOUNTER — Other Ambulatory Visit (HOSPITAL_COMMUNITY): Payer: Self-pay | Admitting: Obstetrics and Gynecology

## 2022-12-27 DIAGNOSIS — Z1231 Encounter for screening mammogram for malignant neoplasm of breast: Secondary | ICD-10-CM

## 2022-12-31 ENCOUNTER — Ambulatory Visit: Payer: BC Managed Care – PPO | Admitting: Bariatrics

## 2022-12-31 ENCOUNTER — Encounter: Payer: Self-pay | Admitting: Bariatrics

## 2022-12-31 VITALS — BP 147/77 | HR 68 | Temp 98.1°F | Ht 63.0 in | Wt 176.0 lb

## 2022-12-31 DIAGNOSIS — I10 Essential (primary) hypertension: Secondary | ICD-10-CM | POA: Diagnosis not present

## 2022-12-31 DIAGNOSIS — Z6831 Body mass index (BMI) 31.0-31.9, adult: Secondary | ICD-10-CM

## 2022-12-31 DIAGNOSIS — E669 Obesity, unspecified: Secondary | ICD-10-CM | POA: Diagnosis not present

## 2022-12-31 DIAGNOSIS — E1169 Type 2 diabetes mellitus with other specified complication: Secondary | ICD-10-CM | POA: Diagnosis not present

## 2022-12-31 NOTE — Progress Notes (Signed)
WEIGHT SUMMARY AND BIOMETRICS  Weight Lost Since Last Visit: 0   Vitals Temp: 98.1 F (36.7 C) BP: (!) 147/77 Pulse Rate: 68 SpO2: 100 %   Anthropometric Measurements Height: '5\' 3"'$  (1.6 m) Weight: 176 lb (79.8 kg) BMI (Calculated): 31.18 Weight at Last Visit: 176lb Weight Lost Since Last Visit: 0 Starting Weight: 223lb Total Weight Loss (lbs): 47 lb (21.3 kg)   Body Composition  Body Fat %: 43.6 % Fat Mass (lbs): 77 lbs Muscle Mass (lbs): 94.4 lbs Total Body Water (lbs): 67.6 lbs Visceral Fat Rating : 11   Other Clinical Data Fasting: yes Labs: no Today's Visit #: 22 Starting Date: 07/03/21    OBESITY Jenna Chavez is here to discuss her progress with her obesity treatment plan along with follow-up of her obesity related diagnoses.     Nutrition Plan: the Category 2 plan - 30% adherence.  Current exercise: none  Interim History:  Her weight remains the same as her previous visit.  She did stop the [facility] due to some abdominal pain and was evaluated and told to stop for the immediate future.  Her PCP and urologist/GI will do a workup in the immediate future to determine the cause of her abdominal pain.  She states that she has had similar pain when she had renal calculi and believes that that is the source of her pain at this time.  We will not resume Mounjaro until after her workup and the source of her pain has been determined.  Patient aware and agrees with plan.   Protein intake is as prescribed and Water intake is adequate.  Pharmacotherapy: Jenna Chavez was on Mounjaro 7.5 mg SQ weekly Adverse side effects:  abdominal pain Hunger is well controlled.  Cravings are moderately controlled.  Assessment/Plan:  1. Type II Diabetes HgbA1c is at goal. Last A1c was 5.9 Episodes of hypoglycemia: no Medication(s): None.  She had been taking Mounjaro as above.  Lab Results  Component Value Date   HGBA1C 5.9 (H) 09/02/2022   HGBA1C 6.2 (H) 12/31/2021    HGBA1C 6.9 (H) 07/03/2021   Lab Results  Component Value Date   LDLCALC 91 09/02/2022   CREATININE 1.33 (H) 12/09/2022    Plan: Discontinue Mounjaro 7.5 mg SQ weekly Continue all other medications.  2.   Will keep all carbohydrates low both sweets and starches.  3.   Will continue exercise regimen to 30 to 60 minutes on most days of the week.  4.   Aim for 7 to 9 hours of sleep nightly.  5.   Eat more low glycemic index foods.    2. Hypertension Hypertension stable.  Medication(s): Lisinopril and Bystolic  BP Readings from Last 3 Encounters:  12/31/22 (!) 147/77  12/10/22 (!) 164/93  12/03/22 124/74   Lab Results  Component Value Date   CREATININE 1.33 (H) 12/09/2022   CREATININE 1.09 (H) 09/02/2022   CREATININE 1.08 (H) 12/31/2021   Plan: 1.Continue all antihypertensives at current dosages. 2. No added salt.     Generalized Obesity: Current BMI 31 Pharmacotherapy Plan Discontinue  Mounjaro 7.5 mg SQ weekly  Jenna Chavez is currently in the action stage of change. As such, her goal is to continue with weight loss efforts.  She has agreed to the Category 2 plan.  Exercise goals: Exercise as tolerated and increase as desired. She will get back to walking.   Behavioral modification strategies: meal planning , increase water intake, and planning for success.  Jenna Chavez has agreed to follow-up with  our clinic in 4 weeks.     Objective:   VITALS: Per patient if applicable, see vitals. GENERAL: Alert and in no acute distress. CARDIOPULMONARY: No increased WOB. Speaking in clear sentences.  PSYCH: Pleasant and cooperative. Speech normal rate and rhythm. Affect is appropriate. Insight and judgement are appropriate. Attention is focused, linear, and appropriate.  NEURO: Oriented as arrived to appointment on time with no prompting.   Attestation Statements:   This was prepared with the assistance of Presenter, broadcasting.  Occasional wrong-word or sound-a-like  substitutions may have occurred due to the inherent limitations of voice recognition software.   Jenna Lesch, DO

## 2023-01-02 ENCOUNTER — Encounter: Payer: Self-pay | Admitting: Internal Medicine

## 2023-01-20 NOTE — H&P (View-Only) (Signed)
   GI Office Note    Referring Provider: Fusco, Lawrence, MD Primary Care Physician:  Fusco, Lawrence, MD  Primary Gastroenterologist: Michael Rourk, MD   Chief Complaint   No chief complaint on file.   History of Present Illness   Jenna Chavez is a 56 y.o. female presenting today at the request of Dr. Fusco for further evaluation of abdominal pain, pancreatic cyst.     Patient seen in the ED 11/2022 for epigastric pain. Completed both CT and MR Abd/MRCP as outlined below.   CT A/P with contrast 12/09/22: IMPRESSION: 1. No CT evidence for acute intra-abdominal or pelvic abnormality. 2. Status post cholecystectomy. Increased diameter of common bile duct compared to prior, measuring up to 12 mm on coronal images. This may be correlated with LFTs and follow-up MRCP as indicated. 3. Aortic atherosclerosis.  MR Abd/MRCP 11/2022: IMPRESSION: -Prior cholecystectomy. Borderline dilatation common bile duct, -without evidence of choledocholithiasis or other obstructing etiology. -Small benign hepatic hemangioma. 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 Recommend continued follow-up by MRI in 6 months. This recommendation follows ACR consensus guidelines: Management of Incidental Pancreatic Cysts: A White Paper of the ACR Incidental Findings Committee. J Am Coll Radiol 2017;14:911-923.  Colonoscopy 03/2022: -normal -repeat colonoscopy in five years  Medications   Current Outpatient Medications  Medication Sig Dispense Refill   BORIC ACID EX Place 600 mg vaginally daily as needed (ph balance).     Cholecalciferol (VITAMIN D3) 50 MCG (2000 UT) TABS Take 2,000 Units by mouth daily.     docusate sodium (COLACE) 100 MG capsule Take 100 mg by mouth daily as needed for moderate constipation.     Insulin Pen Needle 31G X 5 MM MISC Take as directed with  Ozempic 30 each 0   lidocaine (LIDODERM) 5 % Place 1 patch onto the skin daily. Remove & Discard patch within 12 hours or as directed by MD 30 patch 0   lisinopril (ZESTRIL) 5 MG tablet Take 1 tablet (5 mg total) by mouth daily. 90 tablet 3   methocarbamol (ROBAXIN) 500 MG tablet Take 1 tablet (500 mg total) by mouth 2 (two) times daily. 20 tablet 0   nebivolol (BYSTOLIC) 10 MG tablet TAKE 1 TABLET BY MOUTH EVERY DAY 90 tablet 3   nebivolol (BYSTOLIC) 5 MG tablet Take 5 mg by mouth daily.     oxyCODONE-acetaminophen (PERCOCET/ROXICET) 5-325 MG tablet Take 1 tablet by mouth every 6 (six) hours as needed for severe pain. 15 tablet 0   pantoprazole (PROTONIX) 20 MG tablet Take 1 tablet (20 mg total) by mouth daily. 30 tablet 0   pantoprazole (PROTONIX) 20 MG tablet Take 1 tablet (20 mg total) by mouth daily. 30 tablet 0   polyethylene glycol (MIRALAX / GLYCOLAX) 17 g packet Take 17 g by mouth daily as needed for moderate constipation.     pravastatin (PRAVACHOL) 40 MG tablet Take 40 mg by mouth daily.      No current facility-administered medications for this visit.    Allergies   Allergies as of 01/21/2023 - Review Complete 12/31/2022  Allergen Reaction Noted   Hydrocodone  12/12/2018   Dilaudid [hydromorphone hcl] Palpitations 12/12/2018     Past Medical History   Past Medical History:  Diagnosis Date   Anemia    Back pain    Constipation    Degenerative disc disease, cervical      Diabetes (HCC)    Elevated cholesterol    Gastric ulcer 1999   EGD per Dr. Rourk, negative for malignancy   GERD (gastroesophageal reflux disease)    Glucose intolerance (impaired glucose tolerance)    History of kidney stones    Hypertension    Palpitations    Pre-diabetes    Renal cyst, left    Sleep apnea    Stomach ulcer    Vitamin D deficiency     Past Surgical History   Past Surgical History:  Procedure Laterality Date   CHOLECYSTECTOMY     COLONOSCOPY  06/21/2011   RMR: normal  rectum, colon, TI, repeat Aug 2017   COLONOSCOPY N/A 04/10/2017   Surgeon: Rourk, Robert M, MD; Normal exam, repeat in 5 years.   COLONOSCOPY WITH PROPOFOL N/A 04/12/2022   Procedure: COLONOSCOPY WITH PROPOFOL;  Surgeon: Rourk, Robert M, MD;  [facility];  Service: Endoscopy;  Laterality: N/A;  7:30am, asa 2   ESOPHAGOGASTRODUODENOSCOPY  06/21/2011   Planned but not done at time of TCS, no need   OOPHORECTOMY     and fallopian tube on left side   PARTIAL HYSTERECTOMY     Right toe surgery     ROBOTIC ASSITED PARTIAL NEPHRECTOMY Left 12/10/2019   Procedure: XI ROBOTIC ASSITED PARTIAL NEPHRECTOMY;  Surgeon: Manny, Theodore, MD;  Location: [facility];  Service: Urology;  Laterality: Left;  3 HRS   WISDOM TOOTH EXTRACTION      Past Family History   Family History  Problem Relation Age of Onset   Heart attack Mother    Diabetes Mother    High blood pressure Mother    Sudden death Mother    Obesity Mother    Colon polyps Father        onset early age   Heart disease Father    High Cholesterol Father    Stroke Father    Obesity Father    Colon polyps Sister        age 59   Heart disease Sister        defibrillator after myocarditis   Colon polyps Brother        age 55   Colon cancer Neg Hx     Past Social History   Social History   Socioeconomic History   Marital status: Married    Spouse name: Timothy   Number of children: Not on file   Years of education: Not on file   Highest education level: Not on file  Occupational History   Occupation: CNA  Tobacco Use   Smoking status: Never   Smokeless tobacco: Never  Vaping Use   Vaping Use: Never used  Substance and Sexual Activity   Alcohol use: No   Drug use: No   Sexual activity: Not on file  Other Topics Concern   Not on file  Social History Narrative   Not on file   Social Determinants of Health   Financial Resource Strain: Not on file  Food Insecurity: Not on file  Transportation Needs: Not on  file  Physical Activity: Not on file  Stress: Not on file  Social Connections: Not on file  Intimate Partner Violence: Not on file    Review of Systems   General: Negative for anorexia, weight loss, fever, chills, fatigue, weakness. ENT: Negative for hoarseness, difficulty swallowing , nasal congestion. CV: Negative for chest pain, angina, palpitations, dyspnea on exertion, peripheral edema.  Respiratory: Negative for dyspnea at rest, dyspnea on exertion,   cough, sputum, wheezing.  GI: See history of present illness. GU:  Negative for dysuria, hematuria, urinary incontinence, urinary frequency, nocturnal urination.  Endo: Negative for unusual weight change.     Physical Exam   There were no vitals taken for this visit.   General: Well-nourished, well-developed in no acute distress.  Eyes: No icterus. Mouth: Oropharyngeal mucosa moist and pink , no lesions erythema or exudate. Lungs: Clear to auscultation bilaterally.  Heart: Regular rate and rhythm, no murmurs rubs or gallops.  Abdomen: Bowel sounds are normal, nontender, nondistended, no hepatosplenomegaly or masses,  no abdominal bruits or hernia , no rebound or guarding.  Rectal: ***  Extremities: No lower extremity edema. No clubbing or deformities. Neuro: Alert and oriented x 4   Skin: Warm and dry, no jaundice.   Psych: Alert and cooperative, normal mood and affect.  Labs      Lab Results  Component Value Date   CREATININE 1.33 (H) 12/09/2022   BUN 16 12/09/2022   NA 138 12/09/2022   K 3.1 (L) 12/09/2022   CL 106 12/09/2022   CO2 22 12/09/2022   Lab Results  Component Value Date   WBC 6.8 12/09/2022   HGB 13.2 12/09/2022   HCT 39.3 12/09/2022   MCV 87.1 12/09/2022   PLT 307 12/09/2022   Lab Results  Component Value Date   ALT 23 12/09/2022   AST 26 12/09/2022   ALKPHOS 44 12/09/2022   BILITOT 0.4 12/09/2022   Lab Results  Component Value Date   HGBA1C 5.9 (H) 09/02/2022     Imaging Studies    No results found.  Assessment       PLAN   ***   Kalab Camps S. Dulcey Riederer, MHS, PA-C [facility]  

## 2023-01-20 NOTE — Progress Notes (Unsigned)
GI Office Note    Referring Provider: Elfredia Nevins, MD Primary Care Physician:  Elfredia Nevins, MD  Primary Gastroenterologist: Roetta Sessions, MD   Chief Complaint   No chief complaint on file.   History of Present Illness   Jenna Chavez is a 57 y.o. female presenting today at the request of Dr. Sherwood Gambler for further evaluation of abdominal pain, pancreatic cyst.     Patient seen in the ED 11/2022 for epigastric pain. Completed both CT and MR Abd/MRCP as outlined below.   CT A/P with contrast 12/09/22: IMPRESSION: 1. No CT evidence for acute intra-abdominal or pelvic abnormality. 2. Status post cholecystectomy. Increased diameter of common bile duct compared to prior, measuring up to 12 mm on coronal images. This may be correlated with LFTs and follow-up MRCP as indicated. 3. Aortic atherosclerosis.  MR Abd/MRCP 11/2022: IMPRESSION: -Prior cholecystectomy. Borderline dilatation common bile duct, -without evidence of choledocholithiasis or other obstructing etiology. -Small benign hepatic hemangioma. 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 Recommend continued follow-up by MRI in 6 months. This recommendation follows ACR consensus guidelines: Management of Incidental Pancreatic Cysts: A White Paper of the ACR Incidental Findings Committee. J Am Coll Radiol 2017;14:911-923.  Colonoscopy 03/2022: -normal -repeat colonoscopy in five years  Medications   Current Outpatient Medications  Medication Sig Dispense Refill   BORIC ACID EX Place 600 mg vaginally daily as needed (ph balance).     Cholecalciferol (VITAMIN D3) 50 MCG (2000 UT) TABS Take 2,000 Units by mouth daily.     docusate sodium (COLACE) 100 MG capsule Take 100 mg by mouth daily as needed for moderate constipation.     Insulin Pen Needle 31G X 5 MM MISC Take as directed with  Ozempic 30 each 0   lidocaine (LIDODERM) 5 % Place 1 patch onto the skin daily. Remove & Discard patch within 12 hours or as directed by MD 30 patch 0   lisinopril (ZESTRIL) 5 MG tablet Take 1 tablet (5 mg total) by mouth daily. 90 tablet 3   methocarbamol (ROBAXIN) 500 MG tablet Take 1 tablet (500 mg total) by mouth 2 (two) times daily. 20 tablet 0   nebivolol (BYSTOLIC) 10 MG tablet TAKE 1 TABLET BY MOUTH EVERY DAY 90 tablet 3   nebivolol (BYSTOLIC) 5 MG tablet Take 5 mg by mouth daily.     oxyCODONE-acetaminophen (PERCOCET/ROXICET) 5-325 MG tablet Take 1 tablet by mouth every 6 (six) hours as needed for severe pain. 15 tablet 0   pantoprazole (PROTONIX) 20 MG tablet Take 1 tablet (20 mg total) by mouth daily. 30 tablet 0   pantoprazole (PROTONIX) 20 MG tablet Take 1 tablet (20 mg total) by mouth daily. 30 tablet 0   polyethylene glycol (MIRALAX / GLYCOLAX) 17 g packet Take 17 g by mouth daily as needed for moderate constipation.     pravastatin (PRAVACHOL) 40 MG tablet Take 40 mg by mouth daily.      No current facility-administered medications for this visit.    Allergies   Allergies as of 01/21/2023 - Review Complete 12/31/2022  Allergen Reaction Noted   Hydrocodone  12/12/2018   Dilaudid [hydromorphone hcl] Palpitations 12/12/2018     Past Medical History   Past Medical History:  Diagnosis Date   Anemia    Back pain    Constipation    Degenerative disc disease, cervical  Diabetes (HCC)    Elevated cholesterol    Gastric ulcer 1999   EGD per Dr. Jena Gauss, negative for malignancy   GERD (gastroesophageal reflux disease)    Glucose intolerance (impaired glucose tolerance)    History of kidney stones    Hypertension    Palpitations    Pre-diabetes    Renal cyst, left    Sleep apnea    Stomach ulcer    Vitamin D deficiency     Past Surgical History   Past Surgical History:  Procedure Laterality Date   CHOLECYSTECTOMY     COLONOSCOPY  06/21/2011   RMR: normal  rectum, colon, TI, repeat Aug 2017   COLONOSCOPY N/A 04/10/2017   Surgeon: Corbin Ade, MD; Normal exam, repeat in 5 years.   COLONOSCOPY WITH PROPOFOL N/A 04/12/2022   Procedure: COLONOSCOPY WITH PROPOFOL;  Surgeon: Corbin Ade, MD;  [facility];  Service: Endoscopy;  Laterality: N/A;  7:30am, asa 2   ESOPHAGOGASTRODUODENOSCOPY  06/21/2011   Planned but not done at time of TCS, no need   OOPHORECTOMY     and fallopian tube on left side   PARTIAL HYSTERECTOMY     Right toe surgery     ROBOTIC ASSITED PARTIAL NEPHRECTOMY Left 12/10/2019   Procedure: XI ROBOTIC ASSITED PARTIAL NEPHRECTOMY;  Surgeon: Sebastian Ache, MD;  Location: [facility];  Service: Urology;  Laterality: Left;  3 HRS   WISDOM TOOTH EXTRACTION      Past Family History   Family History  Problem Relation Age of Onset   Heart attack Mother    Diabetes Mother    High blood pressure Mother    Sudden death Mother    Obesity Mother    Colon polyps Father        onset early age   Heart disease Father    High Cholesterol Father    Stroke Father    Obesity Father    Colon polyps Sister        age 35   Heart disease Sister        defibrillator after myocarditis   Colon polyps Brother        age 53   Colon cancer Neg Hx     Past Social History   Social History   Socioeconomic History   Marital status: Married    Spouse name: Marcial Pacas   Number of children: Not on file   Years of education: Not on file   Highest education level: Not on file  Occupational History   Occupation: CNA  Tobacco Use   Smoking status: Never   Smokeless tobacco: Never  Vaping Use   Vaping Use: Never used  Substance and Sexual Activity   Alcohol use: No   Drug use: No   Sexual activity: Not on file  Other Topics Concern   Not on file  Social History Narrative   Not on file   Social Determinants of Health   Financial Resource Strain: Not on file  Food Insecurity: Not on file  Transportation Needs: Not on  file  Physical Activity: Not on file  Stress: Not on file  Social Connections: Not on file  Intimate Partner Violence: Not on file    Review of Systems   General: Negative for anorexia, weight loss, fever, chills, fatigue, weakness. ENT: Negative for hoarseness, difficulty swallowing , nasal congestion. CV: Negative for chest pain, angina, palpitations, dyspnea on exertion, peripheral edema.  Respiratory: Negative for dyspnea at rest, dyspnea on exertion,  cough, sputum, wheezing.  GI: See history of present illness. GU:  Negative for dysuria, hematuria, urinary incontinence, urinary frequency, nocturnal urination.  Endo: Negative for unusual weight change.     Physical Exam   There were no vitals taken for this visit.   General: Well-nourished, well-developed in no acute distress.  Eyes: No icterus. Mouth: Oropharyngeal mucosa moist and pink , no lesions erythema or exudate. Lungs: Clear to auscultation bilaterally.  Heart: Regular rate and rhythm, no murmurs rubs or gallops.  Abdomen: Bowel sounds are normal, nontender, nondistended, no hepatosplenomegaly or masses,  no abdominal bruits or hernia , no rebound or guarding.  Rectal: ***  Extremities: No lower extremity edema. No clubbing or deformities. Neuro: Alert and oriented x 4   Skin: Warm and dry, no jaundice.   Psych: Alert and cooperative, normal mood and affect.  Labs      Lab Results  Component Value Date   CREATININE 1.33 (H) 12/09/2022   BUN 16 12/09/2022   NA 138 12/09/2022   K 3.1 (L) 12/09/2022   CL 106 12/09/2022   CO2 22 12/09/2022   Lab Results  Component Value Date   WBC 6.8 12/09/2022   HGB 13.2 12/09/2022   HCT 39.3 12/09/2022   MCV 87.1 12/09/2022   PLT 307 12/09/2022   Lab Results  Component Value Date   ALT 23 12/09/2022   AST 26 12/09/2022   ALKPHOS 44 12/09/2022   BILITOT 0.4 12/09/2022   Lab Results  Component Value Date   HGBA1C 5.9 (H) 09/02/2022     Imaging Studies    No results found.  Assessment       PLAN   ***   Leanna Battles. Melvyn Neth, MHS, PA-C [facility]

## 2023-01-21 ENCOUNTER — Encounter: Payer: Self-pay | Admitting: *Deleted

## 2023-01-21 ENCOUNTER — Ambulatory Visit (INDEPENDENT_AMBULATORY_CARE_PROVIDER_SITE_OTHER): Payer: BC Managed Care – PPO | Admitting: Gastroenterology

## 2023-01-21 ENCOUNTER — Encounter: Payer: Self-pay | Admitting: Gastroenterology

## 2023-01-21 VITALS — BP 160/90 | HR 62 | Temp 97.6°F | Ht 66.5 in | Wt 182.2 lb

## 2023-01-21 DIAGNOSIS — R1013 Epigastric pain: Secondary | ICD-10-CM | POA: Diagnosis not present

## 2023-01-21 DIAGNOSIS — K862 Cyst of pancreas: Secondary | ICD-10-CM | POA: Diagnosis not present

## 2023-01-21 DIAGNOSIS — K5904 Chronic idiopathic constipation: Secondary | ICD-10-CM | POA: Diagnosis not present

## 2023-01-21 DIAGNOSIS — K838 Other specified diseases of biliary tract: Secondary | ICD-10-CM | POA: Diagnosis not present

## 2023-01-21 NOTE — Patient Instructions (Addendum)
Continue pantoprazole 20 mg daily before breakfast. Upper endoscopy to be scheduled. Plan to repeat MRI abdomen in 6 months to follow-up on bile duct dilation and pancreatic cyst.  We will be in touch closer to August. Continue Colace and MiraLAX, remember to take more regularly.  Goal of 4-5 soft stools per week. Recheck your blood pressures at home.  If remains over 140/90, contact your PCP.  It was a pleasure to see you today. I want to create trusting relationships with patients and provide genuine, compassionate, and quality care. I truly value your feedback, so please be on the lookout for a survey regarding your visit with me today. I appreciate your time in completing this!

## 2023-01-27 ENCOUNTER — Other Ambulatory Visit: Payer: Self-pay

## 2023-01-27 ENCOUNTER — Encounter (HOSPITAL_COMMUNITY): Payer: Self-pay

## 2023-01-27 ENCOUNTER — Encounter (HOSPITAL_COMMUNITY)
Admission: RE | Admit: 2023-01-27 | Discharge: 2023-01-27 | Disposition: A | Discharge status: Home / Self Care | Payer: BC Managed Care – PPO | Source: Ambulatory Visit | Attending: Internal Medicine | Admitting: Internal Medicine

## 2023-01-27 HISTORY — DX: Other cervical disc degeneration, unspecified cervical region: M50.30

## 2023-01-28 ENCOUNTER — Ambulatory Visit: Payer: BC Managed Care – PPO | Admitting: Bariatrics

## 2023-01-28 ENCOUNTER — Encounter: Payer: Self-pay | Admitting: Bariatrics

## 2023-01-28 VITALS — BP 139/76 | HR 60 | Temp 97.8°F | Ht 63.0 in | Wt 178.0 lb

## 2023-01-28 DIAGNOSIS — I10 Essential (primary) hypertension: Secondary | ICD-10-CM | POA: Diagnosis not present

## 2023-01-28 DIAGNOSIS — E669 Obesity, unspecified: Secondary | ICD-10-CM | POA: Diagnosis not present

## 2023-01-28 DIAGNOSIS — Z6831 Body mass index (BMI) 31.0-31.9, adult: Secondary | ICD-10-CM

## 2023-01-28 NOTE — Progress Notes (Signed)
   WEIGHT SUMMARY AND BIOMETRICS  Weight Gained Since Last Visit: 2lb   Vitals Temp: 97.8 F (36.6 C) BP: 139/76 Pulse Rate: 60 SpO2: 100 %   Anthropometric Measurements Height: 5\' 3"  (1.6 m) Weight: 178 lb (80.7 kg) BMI (Calculated): 31.54 Weight at Last Visit: 176lb Weight Lost Since Last Visit: 0 Weight Gained Since Last Visit: 2lb Starting Weight: 223lb Total Weight Loss (lbs): 45 lb (20.4 kg)   Body Composition  Body Fat %: 43.8 % Fat Mass (lbs): 78.2 lbs Muscle Mass (lbs): 95.2 lbs Total Body Water (lbs): 67.6 lbs Visceral Fat Rating : 11   Other Clinical Data Fasting: yes Labs: yes Today's Visit #: 23 Starting Date: 07/03/21    OBESITY Jenna Chavez is here to discuss her progress with her obesity treatment plan along with follow-up of her obesity related diagnoses.   Nutrition Plan: the Category 2 plan - 50% adherence.  Current exercise: none  Interim History:  She is up 2 lbs since her last visit. She is anxious about her endoscopy ( ulcers? ) on Thursday. We may going back on the [facility] in the future.  Eating all of the food on the plan., Protein intake is as prescribed, and Is not skipping meals  Pharmacotherapy: Jenna Chavez is on None. She had been on Mounjaro in the past.  Hunger is moderately controlled.  Cravings are moderately controlled.  Assessment/Plan:   Hypertension Hypertension stable.  Medication(s): lisinopril 5 mg.and Bystolic 5 mg  BP Readings from Last 3 Encounters:  01/28/23 139/76  01/21/23 (!) 160/90  12/31/22 (!) 147/77   Lab Results  Component Value Date   CREATININE 1.33 (H) 12/09/2022   CREATININE 1.09 (H) 09/02/2022   CREATININE 1.08 (H) 12/31/2021   No results found for: "GFR"  Plan: Continue all antihypertensives at current dosages. No added salt.  Keep salt intake to 1,500 mg of sodium     Generalized Obesity: Current BMI BMI (Calculated): 31.54  Will get back to her plan at 85 to 95 %.  She will  keep protein and water intake high.   Jenna Chavez is not currently in the action stage of change. As such, her goal is to continue with weight loss efforts.  She has agreed to the Category 2 plan.  Exercise goals: For substantial health benefits, adults should do at least 150 minutes (2 hours and 30 minutes) a week of moderate-intensity, or 75 minutes (1 hour and 15 minutes) a week of vigorous-intensity aerobic physical activity, or an equivalent combination of moderate- and vigorous-intensity aerobic activity. Aerobic activity should be performed in episodes of at least 10 minutes, and preferably, it should be spread throughout the week.  Behavioral modification strategies: meal planning  and increase water intake.  Jenna Chavez has agreed to follow-up with our clinic in 4 weeks.      Objective:   VITALS: Per patient if applicable, see vitals. GENERAL: Alert and in no acute distress. CARDIOPULMONARY: No increased WOB. Speaking in clear sentences.  PSYCH: Pleasant and cooperative. Speech normal rate and rhythm. Affect is appropriate. Insight and judgement are appropriate. Attention is focused, linear, and appropriate.  NEURO: Oriented as arrived to appointment on time with no prompting.   Attestation Statements:   This was prepared with the assistance of Engineer, civil (consulting).  Occasional wrong-word or sound-a-like substitutions may have occurred due to the inherent limitations of voice recognition software.   Corinna Capra, DO

## 2023-01-30 ENCOUNTER — Ambulatory Visit (HOSPITAL_COMMUNITY): Payer: BC Managed Care – PPO | Admitting: Anesthesiology

## 2023-01-30 ENCOUNTER — Encounter (HOSPITAL_COMMUNITY)
Admission: RE | Disposition: A | Discharge status: Home / Self Care | Payer: Self-pay | Source: Home / Self Care | Attending: Internal Medicine

## 2023-01-30 ENCOUNTER — Encounter (HOSPITAL_COMMUNITY): Payer: Self-pay | Admitting: Internal Medicine

## 2023-01-30 ENCOUNTER — Ambulatory Visit (HOSPITAL_COMMUNITY)
Admission: RE | Admit: 2023-01-30 | Discharge: 2023-01-30 | Disposition: A | Discharge status: Home / Self Care | Payer: BC Managed Care – PPO | Attending: Internal Medicine | Admitting: Internal Medicine

## 2023-01-30 DIAGNOSIS — K317 Polyp of stomach and duodenum: Secondary | ICD-10-CM | POA: Diagnosis not present

## 2023-01-30 DIAGNOSIS — R1013 Epigastric pain: Secondary | ICD-10-CM | POA: Diagnosis present

## 2023-01-30 DIAGNOSIS — Z833 Family history of diabetes mellitus: Secondary | ICD-10-CM | POA: Insufficient documentation

## 2023-01-30 DIAGNOSIS — Z79899 Other long term (current) drug therapy: Secondary | ICD-10-CM | POA: Diagnosis not present

## 2023-01-30 DIAGNOSIS — K439 Ventral hernia without obstruction or gangrene: Secondary | ICD-10-CM | POA: Insufficient documentation

## 2023-01-30 DIAGNOSIS — I10 Essential (primary) hypertension: Secondary | ICD-10-CM | POA: Diagnosis not present

## 2023-01-30 DIAGNOSIS — K5909 Other constipation: Secondary | ICD-10-CM | POA: Insufficient documentation

## 2023-01-30 DIAGNOSIS — K862 Cyst of pancreas: Secondary | ICD-10-CM | POA: Insufficient documentation

## 2023-01-30 DIAGNOSIS — Z8711 Personal history of peptic ulcer disease: Secondary | ICD-10-CM | POA: Insufficient documentation

## 2023-01-30 DIAGNOSIS — K4090 Unilateral inguinal hernia, without obstruction or gangrene, not specified as recurrent: Secondary | ICD-10-CM | POA: Diagnosis not present

## 2023-01-30 DIAGNOSIS — G4730 Sleep apnea, unspecified: Secondary | ICD-10-CM | POA: Diagnosis not present

## 2023-01-30 DIAGNOSIS — K838 Other specified diseases of biliary tract: Secondary | ICD-10-CM | POA: Insufficient documentation

## 2023-01-30 DIAGNOSIS — Z905 Acquired absence of kidney: Secondary | ICD-10-CM | POA: Insufficient documentation

## 2023-01-30 DIAGNOSIS — Z8249 Family history of ischemic heart disease and other diseases of the circulatory system: Secondary | ICD-10-CM | POA: Insufficient documentation

## 2023-01-30 DIAGNOSIS — K219 Gastro-esophageal reflux disease without esophagitis: Secondary | ICD-10-CM | POA: Insufficient documentation

## 2023-01-30 DIAGNOSIS — N289 Disorder of kidney and ureter, unspecified: Secondary | ICD-10-CM | POA: Insufficient documentation

## 2023-01-30 DIAGNOSIS — E119 Type 2 diabetes mellitus without complications: Secondary | ICD-10-CM | POA: Insufficient documentation

## 2023-01-30 DIAGNOSIS — G8929 Other chronic pain: Secondary | ICD-10-CM | POA: Diagnosis not present

## 2023-01-30 DIAGNOSIS — K429 Umbilical hernia without obstruction or gangrene: Secondary | ICD-10-CM | POA: Diagnosis not present

## 2023-01-30 HISTORY — DX: Family history of other specified conditions: Z84.89

## 2023-01-30 HISTORY — PX: BIOPSY: SHX5522

## 2023-01-30 HISTORY — PX: ESOPHAGOGASTRODUODENOSCOPY (EGD) WITH PROPOFOL: SHX5813

## 2023-01-30 SURGERY — ESOPHAGOGASTRODUODENOSCOPY (EGD) WITH PROPOFOL
Anesthesia: General

## 2023-01-30 MED ORDER — LACTATED RINGERS IV SOLN: INTRAVENOUS | Status: DC | PRN

## 2023-01-30 MED ORDER — PROPOFOL 10 MG/ML IV BOLUS
INTRAVENOUS | Status: DC | PRN
  Administered 2023-01-30: 50 mg via INTRAVENOUS
  Administered 2023-01-30: 30 mg via INTRAVENOUS
  Administered 2023-01-30: 90 mg via INTRAVENOUS
  Administered 2023-01-30: 60 mg via INTRAVENOUS

## 2023-01-30 MED ORDER — LIDOCAINE HCL (CARDIAC) PF 100 MG/5ML IV SOSY
PREFILLED_SYRINGE | INTRAVENOUS | Status: DC | PRN
  Administered 2023-01-30: 50 mg via INTRATRACHEAL

## 2023-01-30 NOTE — Interval H&P Note (Signed)
History and Physical Interval Note:  01/30/2023 1:44 PM  Jenna Chavez  has presented today for surgery, with the diagnosis of epigastric pain.  The various methods of treatment have been discussed with the patient and family. After consideration of risks, benefits and other options for treatment, the patient has consented to  Procedure(s) with comments: ESOPHAGOGASTRODUODENOSCOPY (EGD) WITH PROPOFOL (N/A) - 1:00 pm, ASA 2 as a surgical intervention.  The patient's history has been reviewed, patient examined, no change in status, stable for surgery.  I have reviewed the patient's chart and labs.  Questions were answered to the patient's satisfaction.     Jenna Chavez    No change.  Diagnostic EGD per plan. The risks, benefits, limitations, alternatives and imponderables have been reviewed with the patient. Potential for esophageal dilation, biopsy, etc. have also been reviewed.  Questions have been answered. All parties agreeable.

## 2023-01-30 NOTE — Anesthesia Preprocedure Evaluation (Signed)
Anesthesia Evaluation  Patient identified by MRN, date of birth, ID band Patient awake    Reviewed: Allergy & Precautions, H&P , NPO status , Patient's Chart, lab work & pertinent test results, reviewed documented beta blocker date and time   History of Anesthesia Complications (+) Family history of anesthesia reaction  Airway Mallampati: II  TM Distance: >3 FB Neck ROM: full    Dental no notable dental hx.    Pulmonary neg pulmonary ROS, sleep apnea    Pulmonary exam normal breath sounds clear to auscultation       Cardiovascular Exercise Tolerance: Good hypertension, negative cardio ROS  Rhythm:regular Rate:Normal     Neuro/Psych negative neurological ROS  negative psych ROS   GI/Hepatic negative GI ROS, Neg liver ROS, PUD,GERD  ,,  Endo/Other  negative endocrine ROSdiabetes    Renal/GU Renal diseasenegative Renal ROS  negative genitourinary   Musculoskeletal   Abdominal   Peds  Hematology negative hematology ROS (+) Blood dyscrasia, anemia   Anesthesia Other Findings   Reproductive/Obstetrics negative OB ROS                             Anesthesia Physical Anesthesia Plan  ASA: 3  Anesthesia Plan: General   Post-op Pain Management:    Induction:   PONV Risk Score and Plan: Propofol infusion  Airway Management Planned:   Additional Equipment:   Intra-op Plan:   Post-operative Plan:   Informed Consent: I have reviewed the patients History and Physical, chart, labs and discussed the procedure including the risks, benefits and alternatives for the proposed anesthesia with the patient or authorized representative who has indicated his/her understanding and acceptance.     Dental Advisory Given  Plan Discussed with: CRNA  Anesthesia Plan Comments:        Anesthesia Quick Evaluation

## 2023-01-30 NOTE — Discharge Instructions (Signed)
EGD Discharge instructions Please read the instructions outlined below and refer to this sheet in the next few weeks. These discharge instructions provide you with general information on caring for yourself after you leave the hospital. Your doctor may also give you specific instructions. While your treatment has been planned according to the most current medical practices available, unavoidable complications occasionally occur. If you have any problems or questions after discharge, please call your doctor. ACTIVITY You may resume your regular activity but move at a slower pace for the next 24 hours.  Take frequent rest periods for the next 24 hours.  Walking will help expel (get rid of) the air and reduce the bloated feeling in your abdomen.  No driving for 24 hours (because of the anesthesia (medicine) used during the test).  You may shower.  Do not sign any important legal documents or operate any machinery for 24 hours (because of the anesthesia used during the test).  NUTRITION Drink plenty of fluids.  You may resume your normal diet.  Begin with a light meal and progress to your normal diet.  Avoid alcoholic beverages for 24 hours or as instructed by your caregiver.  MEDICATIONS You may resume your normal medications unless your caregiver tells you otherwise.  WHAT YOU CAN EXPECT TODAY You may experience abdominal discomfort such as a feeling of fullness or "gas" pains.  FOLLOW-UP Your doctor will discuss the results of your test with you.  SEEK IMMEDIATE MEDICAL ATTENTION IF ANY OF THE FOLLOWING OCCUR: Excessive nausea (feeling sick to your stomach) and/or vomiting.  Severe abdominal pain and distention (swelling).  Trouble swallowing.  Temperature over 101 F (37.8 C).  Rectal bleeding or vomiting of blood.      Your upper GI tract look good.  A small polyp was removed.  Findings would not explain your pain.  Further recommendations to follow pending review of pathology  report  Office visit with Tana Coast in 6 weeks   at patient request, I called Timothy Mayers at [phone]-  reviewed findings and recommendations

## 2023-01-30 NOTE — Op Note (Signed)
[facility] Patient Name: Jenna Chavez Procedure Date: 01/30/2023 1:17 PM MRN: [id number] Date of Birth: 12-Oct-1966 Attending MD: Gennette Pac , MD, [id number] CSN: [id number] Age: 57 Admit Type: Outpatient Procedure:                Upper GI endoscopy Indications:              Epigastric abdominal pain Providers:                Gennette Pac, MD, Jannett Celestine, RN, Dyann Ruddle Referring MD:              Medicines:                Propofol per Anesthesia Complications:            No immediate complications. Estimated Blood Loss:     Estimated blood loss was minimal. Procedure:                Pre-Anesthesia Assessment:                           - Prior to the procedure, a History and Physical                            was performed, and patient medications and                            allergies were reviewed. The patient's tolerance of                            previous anesthesia was also reviewed. The risks                            and benefits of the procedure and the sedation                            options and risks were discussed with the patient.                            All questions were answered, and informed consent                            was obtained. Prior Anticoagulants: The patient has                            taken no anticoagulant or antiplatelet agents. ASA                            Grade Assessment: III - A patient with severe                            systemic disease. After reviewing the risks and  benefits, the patient was deemed in satisfactory                            condition to undergo the procedure.                           After obtaining informed consent, the endoscope was                            passed under direct vision. Throughout the                            procedure, the patient's blood pressure, pulse, and                            oxygen  saturations were monitored continuously. The                            [id number]-[id number] ([id number] scope was introduced through the                            mouth, and advanced to the third part of duodenum.                            The upper GI endoscopy was accomplished without                            difficulty. The patient tolerated the procedure                            well. Scope In: 1:53:54 PM Scope Out: 1:58:11 PM Total Procedure Duration: 0 hours 4 minutes 17 seconds  Findings:      The examined esophagus was normal.      Gastric cavity empty. Normal-appearing gastric mucosa except for       scattered 1 to 3 mm hyperplastic appearing polyps. No ulcer or       infiltrating process. Pylorus patent.      The duodenal bulb, second portion of the duodenum and third portion of       the duodenum were normal. Finally, one of the gastric polyps was removed       with cold biopsy forceps Impression:               - Normal esophagus. Gastric polyps?"few. Status post                            biopsy                           - Normal duodenal bulb, second portion of the                            duodenum and third portion of the duodenum.                           - Moderate Sedation:  Moderate (conscious) sedation was personally administered by an       anesthesia professional. The following parameters were monitored: oxygen       saturation, heart rate, blood pressure, respiratory rate, EKG, adequacy       of pulmonary ventilation, and response to care. Recommendation:           - Patient has a contact number available for                            emergencies. The signs and symptoms of potential                            delayed complications were discussed with the                            patient. Return to normal activities tomorrow.                            Written discharge instructions were provided to the                            patient.                            - Resume previous diet.                           - Continue present medications.                           - Await pathology results.                           - Return to my office in 6 weeks. Procedure Code(s):        --- Professional ---                           [id number], Esophagogastroduodenoscopy, flexible,                            transoral; diagnostic, including collection of                            specimen(s) by brushing or washing, when performed                            (separate procedure) Diagnosis Code(s):        --- Professional ---                           R10.13, Epigastric pain CPT copyright 2022 American Medical Association. All rights reserved. The codes documented in this report are preliminary and upon coder review may  be revised to meet current compliance requirements. Gerrit Friends. Dao Memmott, MD Gennette Pac, MD 01/30/2023 2:09:29 PM This report has been signed electronically. Number of Addenda: 0

## 2023-01-30 NOTE — Transfer of Care (Signed)
Immediate Anesthesia Transfer of Care Note  Patient: Jenna Chavez  Procedure(s) Performed: ESOPHAGOGASTRODUODENOSCOPY (EGD) WITH PROPOFOL BIOPSY  Patient Location: PACU  Anesthesia Type:General  Level of Consciousness: awake, alert , oriented, and patient cooperative  Airway & Oxygen Therapy: Patient Spontanous Breathing  Post-op Assessment: Report given to RN, Post -op Vital signs reviewed and stable, and Patient moving all extremities  Post vital signs: Reviewed  Last Vitals:  Vitals Value Taken Time  BP    Temp    Pulse    Resp    SpO2      Last Pain:  Vitals:   01/30/23 1220  TempSrc: Oral  PainSc: 0-No pain         Complications: No notable events documented.

## 2023-01-31 ENCOUNTER — Encounter (HOSPITAL_COMMUNITY): Payer: Self-pay

## 2023-01-31 ENCOUNTER — Ambulatory Visit (HOSPITAL_COMMUNITY)
Admission: RE | Admit: 2023-01-31 | Discharge: 2023-01-31 | Disposition: A | Discharge status: Home / Self Care | Payer: BC Managed Care – PPO | Source: Ambulatory Visit | Attending: Obstetrics and Gynecology | Admitting: Obstetrics and Gynecology

## 2023-01-31 ENCOUNTER — Other Ambulatory Visit (HOSPITAL_COMMUNITY): Payer: Self-pay | Admitting: Obstetrics and Gynecology

## 2023-01-31 DIAGNOSIS — Z1231 Encounter for screening mammogram for malignant neoplasm of breast: Secondary | ICD-10-CM

## 2023-01-31 NOTE — Anesthesia Postprocedure Evaluation (Signed)
Anesthesia Post Note  Patient: Jenna Chavez  Procedure(s) Performed: ESOPHAGOGASTRODUODENOSCOPY (EGD) WITH PROPOFOL BIOPSY  Patient location during evaluation: Phase II Anesthesia Type: General Level of consciousness: awake Pain management: pain level controlled Vital Signs Assessment: post-procedure vital signs reviewed and stable Respiratory status: spontaneous breathing and respiratory function stable Cardiovascular status: blood pressure returned to baseline and stable Postop Assessment: no headache and no apparent nausea or vomiting Anesthetic complications: no Comments: Late entry   No notable events documented.   Last Vitals:  Vitals:   01/30/23 1220 01/30/23 1403  BP: (!) 150/84 110/68  Pulse: 64 73  Resp: 12 18  Temp: 36.8 C (!) 36.4 C  SpO2: 100% 99%    Last Pain:  Vitals:   01/31/23 1431  TempSrc:   PainSc: 0-No pain                 Windell Norfolk

## 2023-02-03 ENCOUNTER — Ambulatory Visit (HOSPITAL_COMMUNITY): Payer: BC Managed Care – PPO

## 2023-02-03 LAB — SURGICAL PATHOLOGY

## 2023-02-04 ENCOUNTER — Other Ambulatory Visit: Payer: Self-pay | Admitting: Bariatrics

## 2023-02-04 DIAGNOSIS — E1169 Type 2 diabetes mellitus with other specified complication: Secondary | ICD-10-CM

## 2023-02-06 ENCOUNTER — Encounter (HOSPITAL_COMMUNITY): Payer: Self-pay | Admitting: Internal Medicine

## 2023-02-11 ENCOUNTER — Encounter: Payer: Self-pay | Admitting: Internal Medicine

## 2023-02-25 ENCOUNTER — Ambulatory Visit: Payer: BC Managed Care – PPO | Admitting: Bariatrics

## 2023-03-11 ENCOUNTER — Encounter: Payer: Self-pay | Admitting: Gastroenterology

## 2023-03-11 ENCOUNTER — Ambulatory Visit (INDEPENDENT_AMBULATORY_CARE_PROVIDER_SITE_OTHER): Payer: BC Managed Care – PPO | Admitting: Gastroenterology

## 2023-03-11 VITALS — BP 131/77 | HR 70 | Temp 97.7°F | Ht 66.5 in | Wt 177.8 lb

## 2023-03-11 DIAGNOSIS — K838 Other specified diseases of biliary tract: Secondary | ICD-10-CM

## 2023-03-11 DIAGNOSIS — R1030 Lower abdominal pain, unspecified: Secondary | ICD-10-CM

## 2023-03-11 DIAGNOSIS — K862 Cyst of pancreas: Secondary | ICD-10-CM

## 2023-03-11 NOTE — Patient Instructions (Signed)
We will reach out closer to August when you are due next MRI of your abdomen/pancreas. Call if you have any questions or concerns.

## 2023-03-11 NOTE — Progress Notes (Signed)
GI Office Note    Referring Provider: Elfredia Nevins, MD Primary Care Physician:  Elfredia Nevins, MD  Primary Gastroenterologist: Roetta Sessions, MD   Chief Complaint   Chief Complaint  Patient presents with   Follow-up    Follow up. No problems     History of Present Illness   Jenna Chavez is a 57 y.o. female presenting today for follow-up.  Last seen in April.  History of worsening abdominal pain at that time, new diagnosis of pancreatic cyst.  Screening colonoscopy completed in June 2023, normal study, due to family history of colon polyps she will require 5-year surveillance.    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 She has history of umbilical hernia, two right ventral hernias, left inguinal hernias noted on prior imagine. She was previously evaluated at [facility] in 2014 for chronic abdominal pain and suspected to have abdominal wall pain. History of cholecystectomy.    Today: completed EGD as outlined below. doing well. No hb, abd pain, dysphagia, n/v. BMs ok. No melena, brbpr. Wants to restart Mounjaro. Previously started it for obesity and prediabetes. She was on 7.5mg  when she stopped in Feb.   CT A/P with contrast 12/09/22: IMPRESSION: 1. No CT evidence for acute intra-abdominal or pelvic abnormality. 2. Status post cholecystectomy. Increased diameter of common bile duct compared to prior, measuring up to 12 mm on coronal images. This may be correlated with LFTs and follow-up MRCP as indicated. 3. Aortic atherosclerosis.   MR Abd/MRCP 11/2022: IMPRESSION: -Prior cholecystectomy. Borderline dilatation common bile duct, without evidence of choledocholithiasis or other obstructing etiology. -Small benign hepatic hemangioma. 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 Recommend continued follow-up by MRI in 6 months. This recommendation follows ACR consensus guidelines: Management of Incidental Pancreatic Cysts: A White Paper of the ACR Incidental Findings Committee. J Am Coll Radiol 2017;14:911-923.   Egd 01/2023: few gastric polyps, fundic gland polyps. No h.pylori.  Colonoscopy 03/2022: -normal -repeat colonoscopy in five years   Medications   Current Outpatient Medications  Medication Sig Dispense Refill   Cholecalciferol (VITAMIN D3) 50 MCG (2000 UT) TABS Take 2,000 Units by mouth daily.     lisinopril (ZESTRIL) 5 MG tablet Take 1 tablet (5 mg total) by mouth daily. 90 tablet 3   nebivolol (BYSTOLIC) 10 MG tablet TAKE 1 TABLET BY MOUTH EVERY DAY 90 tablet 3   nebivolol (BYSTOLIC) 5 MG tablet Take 5 mg by mouth daily.     pantoprazole (PROTONIX) 20 MG tablet Take 1 tablet (20 mg total) by mouth daily. 30 tablet 0   polyethylene glycol (MIRALAX / GLYCOLAX) 17 g packet Take 17 g by mouth daily as needed for moderate constipation.     pravastatin (PRAVACHOL) 40 MG tablet Take 40 mg by mouth daily.      docusate sodium (COLACE) 100 MG capsule Take 100 mg by mouth daily as needed for moderate constipation.     No current facility-administered medications for this visit.    Allergies   Allergies as of 03/11/2023 - Review Complete 01/30/2023  Allergen Reaction Noted   Hydrocodone  12/12/2018   Dilaudid [hydromorphone hcl] Palpitations 12/12/2018    Review of Systems   General: Negative for anorexia, weight loss, fever, chills,  fatigue, weakness. ENT: Negative for hoarseness, difficulty swallowing , nasal congestion. CV: Negative for chest pain, angina, palpitations, dyspnea on exertion, peripheral edema.  Respiratory: Negative for dyspnea at rest, dyspnea on exertion, cough, sputum, wheezing.  GI: See history of present illness. GU:  Negative for dysuria,  hematuria, urinary incontinence, urinary frequency, nocturnal urination.  Endo: Negative for unusual weight change.     Physical Exam   BP 131/77 (BP Location: Right Arm, Patient Position: Sitting, Cuff Size: Large)   Pulse 70   Temp 97.7 F (36.5 C) (Temporal)   Ht 5' 6.5" (1.689 m)   Wt 177 lb 12.8 oz (80.6 kg)   SpO2 100%   BMI 28.27 kg/m    General: Well-nourished, well-developed in no acute distress.  Eyes: No icterus. Mouth: Oropharyngeal mucosa moist and pink   Abdomen: Bowel sounds are normal, nontender, nondistended, no hepatosplenomegaly or masses,  no abdominal bruits or hernia , no rebound or guarding.  Rectal: not performed Extremities: No lower extremity edema. No clubbing or deformities. Neuro: Alert and oriented x 4   Skin: Warm and dry, no jaundice.   Psych: Alert and cooperative, normal mood and affect.  Labs   Lab Results  Component Value Date   CREATININE 1.33 (H) 12/09/2022   BUN 16 12/09/2022   NA 138 12/09/2022   K 3.1 (L) 12/09/2022   CL 106 12/09/2022   CO2 22 12/09/2022   Lab Results  Component Value Date   WBC 6.8 12/09/2022   HGB 13.2 12/09/2022   HCT 39.3 12/09/2022   MCV 87.1 12/09/2022   PLT 307 12/09/2022   Lab Results  Component Value Date   ALT 23 12/09/2022   AST 26 12/09/2022   ALKPHOS 44 12/09/2022   BILITOT 0.4 12/09/2022    Imaging Studies   No results found.  Assessment   Constipation: doing well on miralax and colace as needed.  Dilated CBD: LFTs normal. No abdominal pain. No significant findings on MRCP. Can follow with MRI planned for pancreatic cyst surveillance.   Pancreatic cyst: due MRI abdomen/MRCP August. Patient needs imaging center on [address] for insurance purposes.   PLAN   Continue pantoprazole, colace, miralax. MRI abd/MRCP in 05/2023. Call with questions or concerns.  Leanna Battles. Melvyn Neth, MHS, PA-C [facility]

## 2023-03-21 ENCOUNTER — Encounter: Payer: Self-pay | Admitting: Gastroenterology

## 2023-03-24 ENCOUNTER — Ambulatory Visit: Payer: BC Managed Care – PPO | Admitting: Bariatrics

## 2023-03-24 ENCOUNTER — Encounter: Payer: Self-pay | Admitting: Bariatrics

## 2023-03-24 VITALS — BP 150/76 | HR 66 | Temp 97.6°F | Ht 63.0 in | Wt 176.0 lb

## 2023-03-24 DIAGNOSIS — Z6831 Body mass index (BMI) 31.0-31.9, adult: Secondary | ICD-10-CM

## 2023-03-24 DIAGNOSIS — E669 Obesity, unspecified: Secondary | ICD-10-CM

## 2023-03-24 DIAGNOSIS — R632 Polyphagia: Secondary | ICD-10-CM | POA: Diagnosis not present

## 2023-03-24 DIAGNOSIS — I10 Essential (primary) hypertension: Secondary | ICD-10-CM | POA: Diagnosis not present

## 2023-03-24 NOTE — Progress Notes (Unsigned)
Chief Complaint:   OBESITY Jenna Chavez is here to discuss her progress with her obesity treatment plan along with follow-up of her obesity related diagnoses. Jenna Chavez is on the Category 2 Plan and states she is following her eating plan approximately 50% of the time. Jenna Chavez states she is doing 0 minutes 0 times per week.  Today's visit was #: 24 Starting weight: 223 lbs Starting date: 07/03/2021 Today's weight: 176 lbs Today's date: 03/24/2023 Total lbs lost to date: 47 Total lbs lost since last in-office visit: 2  Interim History: Patient is down another 2 pounds since her last visit and she is doing well overall.  She has been off the [facility] for 3 months.  She has Ozempic at home at 0.5 mg for 3 weeks and Mounjaro 7.5 mg (11 doses).   Subjective:   1. Essential hypertension Patient is taking Bystolic, and her blood pressure is slightly elevated today.  2. Polyphagia Patient notes GLP-1 helps with food noise.  Assessment/Plan:   1. Essential hypertension Patient will continue Bystolic, and work on eliminating added salt.  2. Polyphagia Patient will begin Ozempic 0.5 mg once weekly (has 3-week supply) and then Mounjaro 7.5 mg once weekly (has 11-week supply).  3. Generalized obesity  4. BMI 31.0-31.9,adult Jenna Chavez is currently in the action stage of change. As such, her goal is to continue with weight loss efforts. She has agreed to the Category 2 Plan.   Meal planning was discussed.  Exercise goals: No exercise has been prescribed at this time.  Behavioral modification strategies: increasing lean protein intake, decreasing simple carbohydrates, increasing vegetables, increasing water intake, decreasing eating out, no skipping meals, meal planning and cooking strategies, keeping healthy foods in the home, and planning for success.  Jenna Chavez has agreed to follow-up with our clinic in 4 weeks. She was informed of the importance of frequent follow-up visits to maximize her success  with intensive lifestyle modifications for her multiple health conditions.   Objective:   Blood pressure (!) 150/76, pulse 66, temperature 97.6 F (36.4 C), height 5\' 3"  (1.6 m), weight 176 lb (79.8 kg), SpO2 100 %. Body mass index is 31.18 kg/m.  General: Cooperative, alert, well developed, in no acute distress. HEENT: Conjunctivae and lids unremarkable. Cardiovascular: Regular rhythm.  Lungs: Normal work of breathing. Neurologic: No focal deficits.   Lab Results  Component Value Date   CREATININE 1.33 (H) 12/09/2022   BUN 16 12/09/2022   NA 138 12/09/2022   K 3.1 (L) 12/09/2022   CL 106 12/09/2022   CO2 22 12/09/2022   Lab Results  Component Value Date   ALT 23 12/09/2022   AST 26 12/09/2022   ALKPHOS 44 12/09/2022   BILITOT 0.4 12/09/2022   Lab Results  Component Value Date   HGBA1C 5.9 (H) 09/02/2022   HGBA1C 6.2 (H) 12/31/2021   HGBA1C 6.9 (H) 07/03/2021   HGBA1C 6.1 (H) 02/07/2020   Lab Results  Component Value Date   INSULIN 18.1 09/02/2022   INSULIN 19.8 12/31/2021   INSULIN 27.2 (H) 07/03/2021   INSULIN 25.4 (H) 02/07/2020   Lab Results  Component Value Date   TSH 0.839 07/03/2021   Lab Results  Component Value Date   CHOL 183 09/02/2022   HDL 75 09/02/2022   LDLCALC 91 09/02/2022   TRIG 97 09/02/2022   Lab Results  Component Value Date   VD25OH 46.2 09/02/2022   VD25OH 45.1 12/31/2021   VD25OH 11.1 (L) 02/10/2020   Lab Results  Component Value Date   WBC 6.8 12/09/2022   HGB 13.2 12/09/2022   HCT 39.3 12/09/2022   MCV 87.1 12/09/2022   PLT 307 12/09/2022   No results found for: "IRON", "TIBC", "FERRITIN"  Attestation Statements:   Reviewed by clinician on day of visit: allergies, medications, problem list, medical history, surgical history, family history, social history, and previous encounter notes.   Trude Mcburney, am acting as Energy manager for [employer], DO.  I have reviewed the above documentation for accuracy and  completeness, and I agree with the above. Corinna Capra, DO

## 2023-03-27 ENCOUNTER — Encounter: Payer: Self-pay | Admitting: Neurology

## 2023-04-03 ENCOUNTER — Encounter: Payer: Self-pay | Admitting: Internal Medicine

## 2023-04-15 ENCOUNTER — Ambulatory Visit (INDEPENDENT_AMBULATORY_CARE_PROVIDER_SITE_OTHER): Payer: BC Managed Care – PPO | Admitting: Neurology

## 2023-04-15 ENCOUNTER — Encounter: Payer: Self-pay | Admitting: Neurology

## 2023-04-15 VITALS — BP 144/98 | HR 74 | Resp 20 | Ht 66.5 in | Wt 178.0 lb

## 2023-04-15 DIAGNOSIS — G5731 Lesion of lateral popliteal nerve, right lower limb: Secondary | ICD-10-CM

## 2023-04-15 NOTE — Progress Notes (Signed)
[facility] Clinic Note - Initial Visit   Date: 04/15/2023   Jenna Chavez MRN: [id number] DOB: [birth year]   Dear Dr. Sherwood Gambler:  Thank you for your kind referral of Jenna Chavez for consultation of right foot numbness. Although her history is well known to you, please allow [place] to reiterate it for the purpose of our medical record. The patient was accompanied to the clinic by self.    Jenna Chavez is a 57 y.o. right-handed female with GERD, hypertension, well-controlled diabetes mellitus, and hyperlipidemia presenting for evaluation of right foot numbness.   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  She has mild weakness with right foot dorsiflexion and eversion and mild diminished sensation over the lateral leg and dorsum of the foot.  Unlikely L5 radiculopathy given normal inversion.  Diagnosis discussed and patient educate dto avoid crossing the legs.  NCS/EMG and formal PT declined.  Start home foot strengthening exercises.  Return to clinic if no improvement in 3 months  ------------------------------------------------------------- History of present illness: In March, she recalls crossing her legs and when she got up her right foot was weak and slapping on the floor when walking.  The weakness lasted about 3-4 days before it started to improve.  She continues to have numbness/tingling of the right leg and foot. She has noticed that if she tries to cross her legs again, she can developed shooting pain down the lateral leg.  Symptoms of numbness/tingling or pain are not constant.    Out-side paper records, electronic medical record, and images have been reviewed where available and summarized as:  Lab Results  Component Value Date   HGBA1C 5.9 (H) 09/02/2022   Lab Results  Component Value Date   VITAMINB12 556 02/07/2020   Lab Results  Component Value Date   TSH 0.839 07/03/2021   No  results found for: "ESRSEDRATE", "POCTSEDRATE"  Past Medical History:  Diagnosis Date   Anemia    Back pain    Constipation    DDD (degenerative disc disease), cervical    Degenerative disc disease, cervical    Diabetes (HCC)    Elevated cholesterol    Family history of adverse reaction to anesthesia    father-nausea and vomiting   Gastric ulcer 1999   EGD per Dr. Jena Gauss, negative for malignancy   GERD (gastroesophageal reflux disease)    Glucose intolerance (impaired glucose tolerance)    History of kidney stones    Hypertension    Palpitations    Pre-diabetes    Renal cyst, left    Sleep apnea    Stomach ulcer    Vitamin D deficiency     Past Surgical History:  Procedure Laterality Date   BIOPSY  01/30/2023   Procedure: BIOPSY;  Surgeon: Corbin Ade, MD;  Location: [facility];  Service: Endoscopy;;   BREAST BIOPSY Right    benign   CHOLECYSTECTOMY     COLONOSCOPY  06/21/2011   RMR: normal rectum, colon, TI, repeat Aug 2017   COLONOSCOPY N/A 04/10/2017   Surgeon: Corbin Ade, MD; Normal exam, repeat in 5 years.   COLONOSCOPY WITH PROPOFOL N/A 04/12/2022   Procedure: COLONOSCOPY WITH PROPOFOL;  Surgeon: Corbin Ade, MD;  Location: [facility];  Service: Endoscopy;  Laterality: N/A;  7:30am, asa 2   ESOPHAGOGASTRODUODENOSCOPY  06/21/2011   Planned but not done at time of TCS, no need   ESOPHAGOGASTRODUODENOSCOPY (EGD) WITH PROPOFOL N/A 01/30/2023  Procedure: ESOPHAGOGASTRODUODENOSCOPY (EGD) WITH PROPOFOL;  Surgeon: Corbin Ade, MD;  Location: [facility];  Service: Endoscopy;  Laterality: N/A;  1:00 pm, ASA 2   KNEE ARTHROSCOPY Right    1997   OOPHORECTOMY     and fallopian tube on left side   PARTIAL HYSTERECTOMY     Right toe surgery     hammer toe   ROBOTIC ASSITED PARTIAL NEPHRECTOMY Left 12/10/2019   Procedure: XI ROBOTIC ASSITED PARTIAL NEPHRECTOMY;  Surgeon: Sebastian Ache, MD;  Location: [facility];  Service: Urology;  Laterality: Left;  3  HRS   WISDOM TOOTH EXTRACTION       Medications:  Outpatient Encounter Medications as of 04/15/2023  Medication Sig Note   Cholecalciferol (VITAMIN D3) 50 MCG (2000 UT) TABS Take 2,000 Units by mouth daily.    docusate sodium (COLACE) 100 MG capsule Take 100 mg by mouth daily as needed for moderate constipation.    lisinopril (ZESTRIL) 5 MG tablet Take 1 tablet (5 mg total) by mouth daily.    nebivolol (BYSTOLIC) 10 MG tablet TAKE 1 TABLET BY MOUTH EVERY DAY 04/09/2022: Pt takes 5 mg and 10 mg together for a daily dose of 15 mg   nebivolol (BYSTOLIC) 5 MG tablet Take 5 mg by mouth daily. 04/09/2022: Pt takes 5 mg and 10 mg together for a daily dose of 15 mg   pantoprazole (PROTONIX) 20 MG tablet Take 1 tablet (20 mg total) by mouth daily.    polyethylene glycol (MIRALAX / GLYCOLAX) 17 g packet Take 17 g by mouth daily as needed for moderate constipation.    pravastatin (PRAVACHOL) 40 MG tablet Take 40 mg by mouth daily.     No facility-administered encounter medications on file as of 04/15/2023.    Allergies:  Allergies  Allergen Reactions   Hydrocodone     Hallucinations    Dilaudid [Hydromorphone Hcl] Palpitations    Family History: Family History  Problem Relation Age of Onset   Heart attack Mother    Diabetes Mother    High blood pressure Mother    Sudden death Mother    Obesity Mother    Colon polyps Father        onset early age   Heart disease Father    High Cholesterol Father    Stroke Father    Obesity Father    Colon polyps Sister        age 4   Heart disease Sister        defibrillator after myocarditis   Colon polyps Brother        age 53   Colon cancer Neg Hx     Social History: Social History   Tobacco Use   Smoking status: Never   Smokeless tobacco: Never  Vaping Use   Vaping Use: Never used  Substance Use Topics   Alcohol use: No   Drug use: No   Social History   Social History Narrative   Right handed   Drinks prn caffeine   One floor  home   Lives with herself and her husband and brother and son   Currently not employed    Vital Signs:  BP (!) 144/98   Pulse 74   Resp 20   Ht 5' 6.5" (1.689 m)   Wt 178 lb (80.7 kg)   SpO2 98%   BMI 28.30 kg/m    Neurological Exam: MENTAL STATUS including orientation to time, place, person, recent and remote memory, attention span and  concentration, language, and fund of knowledge is normal.  Speech is not dysarthric.  CRANIAL NERVES: II:  No visual field defects.     III-IV-VI: Pupils equal round and reactive to light.  Normal conjugate, extra-ocular eye movements in all directions of gaze.  No nystagmus.  No ptosis.   V:  Normal facial sensation.    VII:  Normal facial symmetry and movements.   VIII:  Normal hearing and vestibular function.   IX-X:  Normal palatal movement.   XI:  Normal shoulder shrug and head rotation.   XII:  Normal tongue strength and range of motion, no deviation or fasciculation.  MOTOR:  No atrophy, fasciculations or abnormal movements.  No pronator drift.   Upper Extremity:  Right  Left  Deltoid  5/5   5/5   Biceps  5/5   5/5   Triceps  5/5   5/5   Infraspinatus 5/5  5/5  Medial pectoralis 5/5  5/5  Wrist extensors  5/5   5/5   Wrist flexors  5/5   5/5   Finger extensors  5/5   5/5   Finger flexors  5/5   5/5   Dorsal interossei  5/5   5/5   Abductor pollicis  5/5   5/5   Tone (Ashworth scale)  0  0   Lower Extremity:  Right  Left  Hip flexors  5/5   5/5   Hip extensors  5/5   5/5   Adductor 5/5  5/5  Abductor 5/5  5/5  Knee flexors  5/5   5/5   Knee extensors  5/5   5/5   Eversion 4/5  5/5  Inversion 5/5  5/5  Dorsiflexors  4/5   5/5   Plantarflexors  5/5   5/5   Toe extensors  4/5   5/5   Toe flexors  5/5   5/5   Tone (Ashworth scale)  0  0   MSRs:                                           Right        Left brachioradialis 2+  2+  biceps 2+  2+  triceps 2+  2+  patellar 3+  3+  ankle jerk 2+  2+  Hoffman no  no   plantar response down  down   SENSORY: Diminished pin prick over the right superficial peroneal nerve distribution.  Vibration and temperature is intact.   Sensation is normal in the left leg and upper extremities.   COORDINATION/GAIT: Normal finger-to- nose-finger.  Intact rapid alternating movements bilaterally.  Able to rise from a chair without using arms.  Gait narrow based and stable. Tandem and stressed gait intact.    Thank you for allowing me to participate in patient's care.  If I can answer any additional questions, I would be pleased to do so.    Sincerely,    Aluel Schwarz K. Allena Katz, DO

## 2023-04-15 NOTE — Patient Instructions (Signed)
Avoid crossing your legs  Try to do exercises to bring you ankle and toes up towards you

## 2023-04-28 ENCOUNTER — Ambulatory Visit: Payer: BC Managed Care – PPO | Admitting: Bariatrics

## 2023-04-28 ENCOUNTER — Encounter: Payer: Self-pay | Admitting: Bariatrics

## 2023-04-28 VITALS — BP 135/85 | HR 76 | Temp 97.8°F | Ht 63.0 in | Wt 173.0 lb

## 2023-04-28 DIAGNOSIS — I10 Essential (primary) hypertension: Secondary | ICD-10-CM

## 2023-04-28 DIAGNOSIS — Z6830 Body mass index (BMI) 30.0-30.9, adult: Secondary | ICD-10-CM

## 2023-04-28 DIAGNOSIS — E669 Obesity, unspecified: Secondary | ICD-10-CM

## 2023-04-28 NOTE — Progress Notes (Signed)
   WEIGHT SUMMARY AND BIOMETRICS  Weight Lost Since Last Visit: 3lb   Vitals Temp: 97.8 F (36.6 C) BP: 135/85 Pulse Rate: 76 SpO2: 100 %   Anthropometric Measurements Height: 5\' 3"  (1.6 m) Weight: 173 lb (78.5 kg) BMI (Calculated): 30.65 Weight at Last Visit: 176lb Weight Lost Since Last Visit: 3lb Starting Weight: 223lb Total Weight Loss (lbs): 50 lb (22.7 kg)   Body Composition  Body Fat %: 41 % Fat Mass (lbs): 71.2 lbs Muscle Mass (lbs): 97.4 lbs Total Body Water (lbs): 64.8 lbs Visceral Fat Rating : 10   Other Clinical Data Fasting: yes Labs: no Today's Visit #: 25 Starting Date: 07/03/21    OBESITY Jenna Chavez is here to discuss her progress with her obesity treatment plan along with follow-up of her obesity related diagnoses.     Nutrition Plan: the Category 2 plan - 40% adherence.  Current exercise: walking  Interim History:  She is down 3 lbs since the last visit.  Eating all of the food on the plan., Protein intake is as prescribed, Is not skipping meals, Meeting calorie goals., and Water intake is adequate.  Hunger is well controlled.  Cravings are moderately controlled.  Assessment/Plan:   Hypertension Hypertension stable.  Medication(s): Bystolic 5 mg and Lisinopril 5 mg  BP Readings from Last 3 Encounters:  04/28/23 135/85  04/15/23 (!) 144/98  03/24/23 (!) 150/76   Lab Results  Component Value Date   CREATININE 1.33 (H) 12/09/2022   CREATININE 1.09 (H) 09/02/2022   CREATININE 1.08 (H) 12/31/2021   Plan: Continue all antihypertensives at current dosages. No added salt. Will keep sodium content to 1,500 mg or less per day.   Will limit any eating out.  Will increase her walking.    Generalized Obesity: Current BMI BMI (Calculated): 30.65    Jenna Chavez is currently in the action stage of change. As such, her goal is to continue with weight loss efforts.  She has agreed to the Category 2 plan.  Exercise goals: For  substantial health benefits, adults should do at least 150 minutes (2 hours and 30 minutes) a week of moderate-intensity, or 75 minutes (1 hour and 15 minutes) a week of vigorous-intensity aerobic physical activity, or an equivalent combination of moderate- and vigorous-intensity aerobic activity. Aerobic activity should be performed in episodes of at least 10 minutes, and preferably, it should be spread throughout the week.  Behavioral modification strategies: increasing lean protein intake, meal planning , increase water intake, and keep healthy foods in the home.  Jenna Chavez has agreed to follow-up with our clinic in 4 weeks.       Objective:   VITALS: Per patient if applicable, see vitals. GENERAL: Alert and in no acute distress. CARDIOPULMONARY: No increased WOB. Speaking in clear sentences.  PSYCH: Pleasant and cooperative. Speech normal rate and rhythm. Affect is appropriate. Insight and judgement are appropriate. Attention is focused, linear, and appropriate.  NEURO: Oriented as arrived to appointment on time with no prompting.   Attestation Statements:    This was prepared with the assistance of Engineer, civil (consulting).  Occasional wrong-word or sound-a-like substitutions may have occurred due to the inherent limitations of voice recognition software.   Jenna Capra, DO

## 2023-05-06 ENCOUNTER — Ambulatory Visit: Payer: BC Managed Care – PPO | Admitting: Neurology

## 2023-05-07 ENCOUNTER — Ambulatory Visit: Payer: BC Managed Care – PPO | Admitting: Gastroenterology

## 2023-05-07 ENCOUNTER — Telehealth: Payer: Self-pay

## 2023-05-07 DIAGNOSIS — K862 Cyst of pancreas: Secondary | ICD-10-CM

## 2023-05-07 NOTE — Telephone Encounter (Signed)
Pt returned with the fax number needed. I made a copy of the letter in your box

## 2023-05-07 NOTE — Telephone Encounter (Signed)
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   I will forward the written information patient provided.  Orders need to be faxed to [phone]. She reports that if you go through this process with [facility]) that it will be paid 100%. They arrange for actually scheduling the MR.   Please call patient and make arrangements. Thanks!

## 2023-05-07 NOTE — Progress Notes (Deleted)
GI Office Note    Referring Provider: Elfredia Nevins, MD Primary Care Physician:  Elfredia Nevins, MD  Primary Gastroenterologist: Roetta Sessions, MD   Chief Complaint   No chief complaint on file.   History of Present Illness   Jenna Chavez is a 57 y.o. female presenting today for follow up. Last seen 02/2022. H/o pancreatic cyst, chronic abdominal pain, GERD, constipation, thought to have component of pelvic floor dyssynergia, two right ventral and left inguinal hernias. Previously evaluated at [facility] 2014 for chronic abd pain and suspected abd wall pain.  Screening colonoscopy completed in June 2023, normal study, due to family history of colon polyps she will require 5-year surveillance.      CT A/P with contrast 12/09/22: IMPRESSION: 1. No CT evidence for acute intra-abdominal or pelvic abnormality. 2. Status post cholecystectomy. Increased diameter of common bile duct compared to prior, measuring up to 12 mm on coronal images. This may be correlated with LFTs and follow-up MRCP as indicated. 3. Aortic atherosclerosis.   MR Abd/MRCP 11/2022: IMPRESSION: -Prior cholecystectomy. Borderline dilatation common bile duct, without evidence of choledocholithiasis or other obstructing etiology. -Small benign hepatic hemangioma. 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 Recommend continued follow-up by MRI in 6 months. This recommendation follows ACR consensus guidelines: Management of Incidental Pancreatic Cysts: A White Paper of the ACR Incidental Findings Committee. J Am Coll Radiol 2017;14:911-923.   Egd 01/2023: few gastric polyps, fundic gland polyps. No h.pylori.   Colonoscopy 03/2022: -normal -repeat colonoscopy in five years    Medications   Current Outpatient Medications  Medication Sig Dispense Refill   Cholecalciferol (VITAMIN D3)  50 MCG (2000 UT) TABS Take 2,000 Units by mouth daily.     docusate sodium (COLACE) 100 MG capsule Take 100 mg by mouth daily as needed for moderate constipation.     lisinopril (ZESTRIL) 5 MG tablet Take 1 tablet (5 mg total) by mouth daily. 90 tablet 3   nebivolol (BYSTOLIC) 10 MG tablet TAKE 1 TABLET BY MOUTH EVERY DAY 90 tablet 3   nebivolol (BYSTOLIC) 5 MG tablet Take 5 mg by mouth daily.     pantoprazole (PROTONIX) 20 MG tablet Take 1 tablet (20 mg total) by mouth daily. 30 tablet 0   polyethylene glycol (MIRALAX / GLYCOLAX) 17 g packet Take 17 g by mouth daily as needed for moderate constipation.     pravastatin (PRAVACHOL) 40 MG tablet Take 40 mg by mouth daily.      No current facility-administered medications for this visit.    Allergies   Allergies as of 05/07/2023 - Review Complete 04/28/2023  Allergen Reaction Noted   Hydrocodone  12/12/2018   Dilaudid [hydromorphone hcl] Palpitations 12/12/2018     Past Medical History   Past Medical History:  Diagnosis Date   Anemia    Back pain    Constipation    DDD (degenerative disc disease), cervical    Degenerative disc disease, cervical    Diabetes (HCC)    Elevated cholesterol    Family history of adverse reaction to anesthesia    father-nausea and vomiting   Gastric ulcer 1999   EGD per Dr. Jena Gauss, negative for malignancy   GERD (gastroesophageal reflux disease)    Glucose intolerance (impaired glucose tolerance)    History of kidney stones    Hypertension    Palpitations    Pre-diabetes  Renal cyst, left    Sleep apnea    Stomach ulcer    Vitamin D deficiency     Past Surgical History   Past Surgical History:  Procedure Laterality Date   BIOPSY  01/30/2023   Procedure: BIOPSY;  Surgeon: Corbin Ade, MD;  Location: [facility];  Service: Endoscopy;;   BREAST BIOPSY Right    benign   CHOLECYSTECTOMY     COLONOSCOPY  06/21/2011   RMR: normal rectum, colon, TI, repeat Aug 2017   COLONOSCOPY N/A  04/10/2017   Surgeon: Corbin Ade, MD; Normal exam, repeat in 5 years.   COLONOSCOPY WITH PROPOFOL N/A 04/12/2022   Procedure: COLONOSCOPY WITH PROPOFOL;  Surgeon: Corbin Ade, MD;  Location: [facility];  Service: Endoscopy;  Laterality: N/A;  7:30am, asa 2   ESOPHAGOGASTRODUODENOSCOPY  06/21/2011   Planned but not done at time of TCS, no need   ESOPHAGOGASTRODUODENOSCOPY (EGD) WITH PROPOFOL N/A 01/30/2023   Procedure: ESOPHAGOGASTRODUODENOSCOPY (EGD) WITH PROPOFOL;  Surgeon: Corbin Ade, MD;  Location: [facility];  Service: Endoscopy;  Laterality: N/A;  1:00 pm, ASA 2   KNEE ARTHROSCOPY Right    1997   OOPHORECTOMY     and fallopian tube on left side   PARTIAL HYSTERECTOMY     Right toe surgery     hammer toe   ROBOTIC ASSITED PARTIAL NEPHRECTOMY Left 12/10/2019   Procedure: XI ROBOTIC ASSITED PARTIAL NEPHRECTOMY;  Surgeon: Sebastian Ache, MD;  Location: [facility];  Service: Urology;  Laterality: Left;  3 HRS   WISDOM TOOTH EXTRACTION      Past Family History   Family History  Problem Relation Age of Onset   Heart attack Mother    Diabetes Mother    High blood pressure Mother    Sudden death Mother    Obesity Mother    Colon polyps Father        onset early age   Heart disease Father    High Cholesterol Father    Stroke Father    Obesity Father    Colon polyps Sister        age 2   Heart disease Sister        defibrillator after myocarditis   Colon polyps Brother        age 71   Colon cancer Neg Hx     Past Social History   Social History   Socioeconomic History   Marital status: Married    Spouse name: Marcial Pacas   Number of children: Not on file   Years of education: Not on file   Highest education level: Not on file  Occupational History   Occupation: CNA  Tobacco Use   Smoking status: Never   Smokeless tobacco: Never  Vaping Use   Vaping status: Never Used  Substance and Sexual Activity   Alcohol use: No   Drug use: No   Sexual  activity: Yes  Other Topics Concern   Not on file  Social History Narrative   Right handed   Drinks prn caffeine   One floor home   Lives with herself and her husband and brother and son   Currently not employed   Social Determinants of Health   Financial Resource Strain: Not on file  Food Insecurity: Not on file  Transportation Needs: Not on file  Physical Activity: Not on file  Stress: Not on file  Social Connections: Unknown (03/04/2022)   [facility] from [facility]   Social  Network    Social Network: Not on file  Intimate Partner Violence: Unknown (01/24/2022)   Received from [facility]   HITS    Physically Hurt: Not on file    Insult or Talk Down To: Not on file    Threaten Physical Harm: Not on file    Scream or Curse: Not on file    Review of Systems   General: Negative for anorexia, weight loss, fever, chills, fatigue, weakness. ENT: Negative for hoarseness, difficulty swallowing , nasal congestion. CV: Negative for chest pain, angina, palpitations, dyspnea on exertion, peripheral edema.  Respiratory: Negative for dyspnea at rest, dyspnea on exertion, cough, sputum, wheezing.  GI: See history of present illness. GU:  Negative for dysuria, hematuria, urinary incontinence, urinary frequency, nocturnal urination.  Endo: Negative for unusual weight change.     Physical Exam   There were no vitals taken for this visit.   General: Well-nourished, well-developed in no acute distress.  Eyes: No icterus. Mouth: Oropharyngeal mucosa moist and pink , no lesions erythema or exudate. Lungs: Clear to auscultation bilaterally.  Heart: Regular rate and rhythm, no murmurs rubs or gallops.  Abdomen: Bowel sounds are normal, nontender, nondistended, no hepatosplenomegaly or masses,  no abdominal bruits or hernia , no rebound or guarding.  Rectal: ***  Extremities: No lower extremity edema. No clubbing or deformities. Neuro: Alert and oriented x 4   Skin: Warm and dry, no  jaundice.   Psych: Alert and cooperative, normal mood and affect.  Labs   Lab Results  Component Value Date   NA 138 12/09/2022   CL 106 12/09/2022   K 3.1 (L) 12/09/2022   CO2 22 12/09/2022   BUN 16 12/09/2022   CREATININE 1.33 (H) 12/09/2022   GFRNONAA 48 (L) 12/09/2022   CALCIUM 9.8 12/09/2022   ALBUMIN 4.0 12/09/2022   GLUCOSE 170 (H) 12/09/2022   Lab Results  Component Value Date   ALT 23 12/09/2022   AST 26 12/09/2022   ALKPHOS 44 12/09/2022   BILITOT 0.4 12/09/2022   Lab Results  Component Value Date   WBC 6.8 12/09/2022   HGB 13.2 12/09/2022   HCT 39.3 12/09/2022   MCV 87.1 12/09/2022   PLT 307 12/09/2022    Imaging Studies   No results found.  Assessment       PLAN   ***   Leanna Battles. Melvyn Neth, MHS, PA-C [facility]

## 2023-05-08 NOTE — Telephone Encounter (Signed)
I have faxed order to [phone]. Fax on my desk stated she is already scheduled for 8/24.

## 2023-05-08 NOTE — Addendum Note (Signed)
Addended by: Armstead Peaks on: 05/08/2023 11:51 AM   Modules accepted: Orders

## 2023-05-13 ENCOUNTER — Ambulatory Visit: Payer: BC Managed Care – PPO | Admitting: Gastroenterology

## 2023-05-26 ENCOUNTER — Telehealth: Payer: Self-pay

## 2023-05-26 ENCOUNTER — Ambulatory Visit: Payer: BC Managed Care – PPO | Admitting: Bariatrics

## 2023-05-26 NOTE — Telephone Encounter (Signed)
MRI/MRCP results scanned under media for review.

## 2023-05-27 ENCOUNTER — Ambulatory Visit: Payer: BC Managed Care – PPO | Admitting: Physician Assistant

## 2023-05-28 NOTE — Telephone Encounter (Signed)
Please let pt know that overall MRI looks better.   The pancreatic cyst is smaller in size (previously 1.7 X 1.3cm and now 0.6 X 0.5 cm) and may have been pseudocyst from prior focal pancreatitis. She has normal variant anatomy called pancreas divisum which can lead to recurrent pancreatitis in some patients. She has what is suspected to be benign liver lesion right liver lobe. No bile duct dilation by MRI.   I would offer her repeat MRI Abd/MRCP with and without contrast in one year and if pancreatic cyst unchanged and liver lesion unchanged, then likely will not need additional imaging of these findings.   She should follow up at any time she has change in symptoms.

## 2023-05-29 NOTE — Telephone Encounter (Signed)
Pt was made aware and verbalized understanding.  

## 2023-06-18 NOTE — Progress Notes (Unsigned)
    Cardiology Office Note  Date: 06/19/2023   ID: Keyahna Harten Klemp, DOB 1966/01/26, MRN [id number]  History of Present Illness: Jenna Chavez is a 57 y.o. female last seen in August 2023.  She is here for a routine visit.  She reports occasional palpitations, no progressive symptoms or associated syncope.  States that she has been walking for exercise, lost about 30 pounds using Ozempic previously.  She has been able to maintain a stable weight.  I reviewed her medications which are otherwise unchanged.  She continues to follow with Dr. Sherwood Gambler.  She had an ECG done back in February with lab work as noted below.  Physical Exam: VS:  BP 138/84 (BP Location: Left Arm, Patient Position: Sitting, Cuff Size: Normal)   Pulse 78   Ht 5' 6.5" (1.689 m)   Wt 178 lb 12.8 oz (81.1 kg)   SpO2 99%   BMI 28.43 kg/m , BMI Body mass index is 28.43 kg/m.  Wt Readings from Last 3 Encounters:  06/19/23 178 lb 12.8 oz (81.1 kg)  04/28/23 173 lb (78.5 kg)  04/15/23 178 lb (80.7 kg)    General: Patient appears comfortable at rest. HEENT: Conjunctiva and lids normal. Neck: Supple, no elevated JVP or carotid bruits. Lungs: Clear to auscultation, nonlabored breathing at rest. Cardiac: Regular rate and rhythm, no S3 or significant systolic murmur. Extremities: No pitting edema.  ECG:  An ECG dated 12/09/2022 was personally reviewed today and demonstrated:  Sinus rhythm.  Labwork: 12/09/2022: ALT 23; AST 26; BUN 16; Creatinine, Ser 1.33; Hemoglobin 13.2; Platelets 307; Potassium 3.1; Sodium 138     Component Value Date/Time   CHOL 183 09/02/2022 1050   TRIG 97 09/02/2022 1050   HDL 75 09/02/2022 1050   LDLCALC 91 09/02/2022 1050   Other Studies Reviewed Today:  No interval cardiac testing for review today.  Assessment and Plan:  1.  Palpitations.  No progressive symptoms and tolerating Bystolic well.  ECG in February was normal.  Continue observation.  2.  Essential hypertension.  She remains  on Bystolic and lisinopril, no changes made today.  Encouraged regular walking plan for exercise.  3.  Mixed hyperlipidemia.  Continue Pravachol with follow-up lab work by Dr. Sherwood Gambler.  Disposition:  Follow up  1 year.  Signed, Jonelle Sidle, M.D., F.A.C.C. [facility]

## 2023-06-19 ENCOUNTER — Ambulatory Visit: Payer: BC Managed Care – PPO | Attending: Cardiology | Admitting: Cardiology

## 2023-06-19 ENCOUNTER — Encounter: Payer: Self-pay | Admitting: Cardiology

## 2023-06-19 VITALS — BP 138/84 | HR 78 | Ht 66.5 in | Wt 178.8 lb

## 2023-06-19 DIAGNOSIS — R002 Palpitations: Secondary | ICD-10-CM

## 2023-06-19 DIAGNOSIS — I10 Essential (primary) hypertension: Secondary | ICD-10-CM | POA: Diagnosis not present

## 2023-06-19 DIAGNOSIS — E782 Mixed hyperlipidemia: Secondary | ICD-10-CM

## 2023-06-19 NOTE — Patient Instructions (Signed)
Medication Instructions:  Your physician recommends that you continue on your current medications as directed. Please refer to the Current Medication list given to you today.   Labwork: None today  Testing/Procedures: None today  Follow-Up: 1 year Dr.McDowell  Any Other Special Instructions Will Be Listed Below (If Applicable).  If you need a refill on your cardiac medications before your next appointment, please call your pharmacy.

## 2023-06-30 ENCOUNTER — Ambulatory Visit: Payer: BC Managed Care – PPO | Admitting: Bariatrics

## 2023-06-30 ENCOUNTER — Encounter: Payer: Self-pay | Admitting: Bariatrics

## 2023-06-30 ENCOUNTER — Ambulatory Visit: Payer: BC Managed Care – PPO | Admitting: Physician Assistant

## 2023-06-30 VITALS — BP 117/71 | HR 78 | Temp 98.1°F | Ht 63.0 in | Wt 173.0 lb

## 2023-06-30 DIAGNOSIS — E669 Obesity, unspecified: Secondary | ICD-10-CM

## 2023-06-30 DIAGNOSIS — Z6830 Body mass index (BMI) 30.0-30.9, adult: Secondary | ICD-10-CM

## 2023-06-30 DIAGNOSIS — E7849 Other hyperlipidemia: Secondary | ICD-10-CM

## 2023-06-30 DIAGNOSIS — E785 Hyperlipidemia, unspecified: Secondary | ICD-10-CM

## 2023-06-30 NOTE — Progress Notes (Signed)
WEIGHT SUMMARY AND BIOMETRICS  Weight Lost Since Last Visit: 0  Weight Gained Since Last Visit: 0   Vitals Temp: 98.1 F (36.7 C) BP: 117/71 Pulse Rate: 78 SpO2: 100 %   Anthropometric Measurements Height: 5\' 3"  (1.6 m) Weight: 173 lb (78.5 kg) BMI (Calculated): 30.65 Weight at Last Visit: 173lb Weight Lost Since Last Visit: 0 Weight Gained Since Last Visit: 0 Starting Weight: 223lb Total Weight Loss (lbs): 50 lb (22.7 kg)   Body Composition  Body Fat %: 40.7 % Fat Mass (lbs): 70.6 lbs Muscle Mass (lbs): 97.6 lbs Total Body Water (lbs): 65.4 lbs Visceral Fat Rating : 10   Other Clinical Data Fasting: no Labs: no Today's Visit #: 26 Starting Date: 07/03/21    OBESITY [place] is here to discuss her progress with her obesity treatment plan along with follow-up of her obesity related diagnoses.     Nutrition Plan: the Category 2 plan - 40% adherence.  Current exercise: none  Interim History:  Her weight remains the same. She had a procedure and had been off the Mounjaro 7.5 mg,but has now resumed it. She denies issues with the medication.  Eating all of the food on the plan., Protein intake is as prescribed, Is not skipping meals, Water intake is adequate., and Denies polyphagia  Pharmacotherapy: Joyful is on Mounjaro 7.5 mg SQ weekly Adverse side effects: None Hunger is moderately controlled.  Cravings are moderately controlled.  Assessment/Plan:   Hyperlipidemia LDL is not at goal. Medication(s): Bystolic, Zestril Cardiovascular risk factors: dyslipidemia, hypertension, obesity (BMI >= 30 kg/m2), and sedentary lifestyle  Lab Results  Component Value Date   CHOL 183 09/02/2022   HDL 75 09/02/2022   LDLCALC 91 09/02/2022   TRIG 97 09/02/2022   Lab Results  Component Value Date   ALT 23 12/09/2022   AST 26 12/09/2022   ALKPHOS 44 12/09/2022   BILITOT 0.4 12/09/2022   The 10-year ASCVD risk score (Arnett DK, et al., 2019) is:  5.2%   Values used to calculate the score:     Age: 24 years     Sex: Female     Is Non-Hispanic African American: Yes     Diabetic: Yes     Tobacco smoker: No     Systolic Blood Pressure: 117 mmHg     Is BP treated: Yes     HDL Cholesterol: 75 mg/dL     Total Cholesterol: 183 mg/dL  Plan:  Continue statin.  Will avoid all trans fats.  Will read labels Will minimize saturated fats except the following: low fat meats in moderation, diary, and limited dark chocolate.  Will pick healthy snacks.      Generalized Obesity: Current BMI BMI (Calculated): 30.65   Pharmacotherapy Plan Continue  Mounjaro 7.5 mg SQ weekly  Tauheedah is currently in the action stage of change. As such, her goal is to continue with weight loss efforts.  She has agreed to the Category 2 plan. She will increase healthy protein and vegetables.   Exercise goals: All adults should avoid inactivity. Some physical activity is better than none, and adults who participate in any amount of physical activity gain some health benefits.  Behavioral modification strategies: better snacking choices, increasing vegetables, increasing fiber rich foods, and keep healthy foods in the home.  Felissa has agreed to follow-up with our clinic in 4 weeks.      Objective:   VITALS: Per patient if applicable, see vitals. GENERAL: Alert and in no acute distress.  CARDIOPULMONARY: No increased WOB. Speaking in clear sentences.  PSYCH: Pleasant and cooperative. Speech normal rate and rhythm. Affect is appropriate. Insight and judgement are appropriate. Attention is focused, linear, and appropriate.  NEURO: Oriented as arrived to appointment on time with no prompting.   Attestation Statements:   This was prepared with the assistance of Engineer, civil (consulting).  Occasional wrong-word or sound-a-like substitutions may have occurred due to the inherent limitations of voice recognition software.   Corinna Capra, DO

## 2023-07-28 ENCOUNTER — Encounter: Payer: Self-pay | Admitting: Bariatrics

## 2023-07-28 ENCOUNTER — Ambulatory Visit: Payer: BC Managed Care – PPO | Admitting: Bariatrics

## 2023-07-28 VITALS — BP 130/83 | HR 77 | Temp 97.9°F | Ht 63.0 in | Wt 164.0 lb

## 2023-07-28 DIAGNOSIS — E7849 Other hyperlipidemia: Secondary | ICD-10-CM

## 2023-07-28 DIAGNOSIS — Z6829 Body mass index (BMI) 29.0-29.9, adult: Secondary | ICD-10-CM | POA: Insufficient documentation

## 2023-07-28 DIAGNOSIS — E669 Obesity, unspecified: Secondary | ICD-10-CM

## 2023-07-29 NOTE — Progress Notes (Unsigned)
Chief Complaint:   OBESITY Jenna Chavez is here to discuss her progress with her obesity treatment plan along with follow-up of her obesity related diagnoses. Jenna Chavez is on the Category 2 Plan and states she is following her eating plan approximately 60% of the time. Jenna Chavez states she is walking for 15 minutes 3 times per week.  Today's visit was #: 26 Starting weight: 223 lbs Starting date: 07/03/2021 Today's weight: 164 lbs Today's date: 07/28/2023 Total lbs lost to date: 59 Total lbs lost since last in-office visit: 9  Interim History: Patient is down 9 lbs. She is now out of the obesity category  and into the overweight group. She notes she is sick of water.   Subjective:   1. Other hyperlipidemia Patient is taking Pravachol.   Assessment/Plan:   1. Other hyperlipidemia Patient will continue her medications, and will work on minimizing saturated fats.   2. Generalized obesity  3. BMI 29.0-29.9,adult Jenna Chavez is currently in the action stage of change. As such, her goal is to continue with weight loss efforts. She has agreed to the Category 2 Plan.   Congratulations was offered. She will continue her plan and exercise at 80-90%. Increase water.   Exercise goals: As is.   Behavioral modification strategies: increasing lean protein intake, decreasing simple carbohydrates, increasing vegetables, increasing water intake, decreasing eating out, no skipping meals, meal planning and cooking strategies, keeping healthy foods in the home, and planning for success.  Jenna Chavez has agreed to follow-up with our clinic in 6 weeks. She was informed of the importance of frequent follow-up visits to maximize her success with intensive lifestyle modifications for her multiple health conditions.   Objective:   Blood pressure 130/83, pulse 77, temperature 97.9 F (36.6 C), height 5\' 3"  (1.6 m), weight 164 lb (74.4 kg), SpO2 100%. Body mass index is 29.05 kg/m.  General: Cooperative, alert, well  developed, in no acute distress. HEENT: Conjunctivae and lids unremarkable. Cardiovascular: Regular rhythm.  Lungs: Normal work of breathing. Neurologic: No focal deficits.   Lab Results  Component Value Date   CREATININE 1.33 (H) 12/09/2022   BUN 16 12/09/2022   NA 138 12/09/2022   K 3.1 (L) 12/09/2022   CL 106 12/09/2022   CO2 22 12/09/2022   Lab Results  Component Value Date   ALT 23 12/09/2022   AST 26 12/09/2022   ALKPHOS 44 12/09/2022   BILITOT 0.4 12/09/2022   Lab Results  Component Value Date   HGBA1C 5.9 (H) 09/02/2022   HGBA1C 6.2 (H) 12/31/2021   HGBA1C 6.9 (H) 07/03/2021   HGBA1C 6.1 (H) 02/07/2020   Lab Results  Component Value Date   INSULIN 18.1 09/02/2022   INSULIN 19.8 12/31/2021   INSULIN 27.2 (H) 07/03/2021   INSULIN 25.4 (H) 02/07/2020   Lab Results  Component Value Date   TSH 0.839 07/03/2021   Lab Results  Component Value Date   CHOL 183 09/02/2022   HDL 75 09/02/2022   LDLCALC 91 09/02/2022   TRIG 97 09/02/2022   Lab Results  Component Value Date   VD25OH 46.2 09/02/2022   VD25OH 45.1 12/31/2021   VD25OH 11.1 (L) 02/10/2020   Lab Results  Component Value Date   WBC 6.8 12/09/2022   HGB 13.2 12/09/2022   HCT 39.3 12/09/2022   MCV 87.1 12/09/2022   PLT 307 12/09/2022   No results found for: "IRON", "TIBC", "FERRITIN"  Attestation Statements:   Reviewed by clinician on day of visit: allergies, medications,  problem list, medical history, surgical history, family history, social history, and previous encounter notes.   Trude Mcburney, am acting as Energy manager for [employer], DO.  I have reviewed the above documentation for accuracy and completeness, and I agree with the above. Corinna Capra, DO

## 2023-07-30 ENCOUNTER — Ambulatory Visit: Payer: BC Managed Care – PPO | Admitting: Cardiology

## 2023-07-30 ENCOUNTER — Encounter: Payer: Self-pay | Admitting: Bariatrics

## 2023-08-05 ENCOUNTER — Other Ambulatory Visit: Payer: Self-pay | Admitting: Bariatrics

## 2023-08-05 DIAGNOSIS — I10 Essential (primary) hypertension: Secondary | ICD-10-CM

## 2023-08-07 ENCOUNTER — Other Ambulatory Visit: Payer: Self-pay | Admitting: Bariatrics

## 2023-08-07 DIAGNOSIS — I10 Essential (primary) hypertension: Secondary | ICD-10-CM

## 2023-09-08 ENCOUNTER — Encounter: Payer: Self-pay | Admitting: Bariatrics

## 2023-09-08 ENCOUNTER — Ambulatory Visit: Payer: BC Managed Care – PPO | Admitting: Bariatrics

## 2023-09-08 VITALS — BP 107/70 | HR 81 | Temp 97.8°F | Ht 66.0 in | Wt 154.0 lb

## 2023-09-08 DIAGNOSIS — Z6824 Body mass index (BMI) 24.0-24.9, adult: Secondary | ICD-10-CM | POA: Diagnosis not present

## 2023-09-08 DIAGNOSIS — E66812 Obesity, class 2: Secondary | ICD-10-CM

## 2023-09-08 DIAGNOSIS — E785 Hyperlipidemia, unspecified: Secondary | ICD-10-CM | POA: Diagnosis not present

## 2023-09-08 DIAGNOSIS — E669 Obesity, unspecified: Secondary | ICD-10-CM | POA: Diagnosis not present

## 2023-09-08 DIAGNOSIS — E7849 Other hyperlipidemia: Secondary | ICD-10-CM

## 2023-09-08 DIAGNOSIS — R632 Polyphagia: Secondary | ICD-10-CM | POA: Diagnosis not present

## 2023-09-08 MED ORDER — TIRZEPATIDE 5 MG/0.5ML ~~LOC~~ SOAJ
5.0000 mg | SUBCUTANEOUS | 0 refills | Status: DC
Start: 2023-09-08 — End: 2023-09-24

## 2023-09-08 NOTE — Progress Notes (Signed)
WEIGHT SUMMARY AND BIOMETRICS  Weight Lost Since Last Visit: 10lb  Weight Gained Since Last Visit: 0   Vitals Temp: 97.8 F (36.6 C) BP: 107/70 Pulse Rate: 81 SpO2: 100 %   Anthropometric Measurements Height: 5\' 6"  (1.676 m) Weight: 154 lb (69.9 kg) BMI (Calculated): 24.87 Weight at Last Visit: 164lb Weight Lost Since Last Visit: 10lb Weight Gained Since Last Visit: 0 Starting Weight: 223lb Total Weight Loss (lbs): 69 lb (31.3 kg)   Body Composition  Body Fat %: 39.6 % Fat Mass (lbs): 61.2 lbs Muscle Mass (lbs): 88.6 lbs Total Body Water (lbs): 60.6 lbs Visceral Fat Rating : 9   Other Clinical Data Fasting: no Labs: no Today's Visit #: 28 Starting Date: 07/03/21    OBESITY Kenny is here to discuss her progress with her obesity treatment plan along with follow-up of her obesity related diagnoses.    Nutrition Plan: the Category 2 plan - 70% adherence.  Current exercise: none  Interim History:  She is down 10 lbs since her last visit.  Eating all of the food on the plan., Protein intake is as prescribed, Is skipping meals, and Water intake is adequate.  Hunger is moderately controlled.  Cravings are moderately controlled.  Assessment/Plan:   Hyperlipidemia LDL is not at goal. Medication(s): Pravachol Cardiovascular risk factors: dyslipidemia, obesity (BMI >= 30 kg/m2), and sedentary lifestyle  Lab Results  Component Value Date   CHOL 183 09/02/2022   HDL 75 09/02/2022   LDLCALC 91 09/02/2022   TRIG 97 09/02/2022   Lab Results  Component Value Date   ALT 23 12/09/2022   AST 26 12/09/2022   ALKPHOS 44 12/09/2022   BILITOT 0.4 12/09/2022   The 10-year ASCVD risk score (Arnett DK, et al., 2019) is: 3.8%   Values used to calculate the score:     Age: 73 years     Sex: Female     Is Non-Hispanic African American: Yes      Diabetic: Yes     Tobacco smoker: No     Systolic Blood Pressure: 107 mmHg     Is BP treated: Yes     HDL Cholesterol: 75 mg/dL     Total Cholesterol: 183 mg/dL  Plan:  Continue statin.  Will avoid all trans fats.  Will read labels Will minimize saturated fats except the following: low fat meats in moderation, diary, and limited dark chocolate.   Polyphagia Charish endorses minimal excessive hunger.  Medication(s): Mounjaro  Effects of medication:  moderately controlled. Cravings are moderately controlled.   Plan: Medication(s): Mounjaro 5.0 mg SQ weekly (decrease from Mounjaro 7.5 mg to 5.0 mg).  Will increase water, protein and fiber to help assuage hunger.  Will minimize foods that have a high glucose index/load to minimize reactive hypoglycemia.  She will have a protein shake if she does not eat.  Generalized Obesity: Current BMI BMI (Calculated): 24.87    Dezlynn is currently in the action stage of change. As such, her goal is to continue with weight loss efforts.  She has agreed to the Category 2 plan.  Exercise goals: All adults should avoid inactivity. Some physical activity is better than none, and adults who participate in any amount of physical activity gain some health benefits.  Behavioral modification strategies: increasing lean protein intake, no meal skipping, meal planning , increase water intake, planning for success, increasing vegetables, and increasing fiber rich foods.  Janiel has agreed to follow-up with our clinic in 4 weeks.       Objective:   VITALS: Per patient if applicable, see vitals. GENERAL: Alert and in no acute distress. CARDIOPULMONARY: No increased WOB. Speaking in clear sentences.  PSYCH: Pleasant and cooperative. Speech normal rate and rhythm. Affect is appropriate. Insight and judgement are appropriate. Attention is focused, linear, and appropriate.  NEURO: Oriented as arrived to appointment on time with no prompting.    Attestation Statements:    This was prepared with the assistance of Engineer, civil (consulting).  Occasional wrong-word or sound-a-like substitutions may have occurred due to the inherent limitations of voice recognition   Corinna Capra, DO

## 2023-09-24 ENCOUNTER — Telehealth: Payer: Self-pay

## 2023-09-24 ENCOUNTER — Encounter: Payer: Self-pay | Admitting: Bariatrics

## 2023-09-24 ENCOUNTER — Other Ambulatory Visit: Payer: Self-pay | Admitting: Bariatrics

## 2023-09-24 DIAGNOSIS — E66812 Obesity, class 2: Secondary | ICD-10-CM

## 2023-09-24 DIAGNOSIS — R632 Polyphagia: Secondary | ICD-10-CM

## 2023-09-24 MED ORDER — TIRZEPATIDE 5 MG/0.5ML ~~LOC~~ SOAJ
5.0000 mg | SUBCUTANEOUS | 0 refills | Status: DC
Start: 2023-09-24 — End: 2023-12-15

## 2023-09-24 NOTE — Telephone Encounter (Signed)
Patient stated that she needs a 90 day prescription of her Mounjaro in order for insurance to pay for medication.  Notified patient I will inform Dr. Manson Passey.  Patient verbalized understanding.

## 2023-09-24 NOTE — Telephone Encounter (Signed)
PA started [facility] via covermymeds

## 2023-09-24 NOTE — Telephone Encounter (Signed)
F/u   Patient calling back with additional information for prior authorization  tirzepatide [facility]) 5 MG/0.5ML Pen needs to be enter as a 90 day supply.     Phone # [phone]. Needs to be started within 48 hours.

## 2023-09-24 NOTE — Telephone Encounter (Signed)
Mounjaro 5mg  approved 09/24/2023 to 09/23/2026.

## 2023-10-06 ENCOUNTER — Ambulatory Visit: Payer: BC Managed Care – PPO | Admitting: Bariatrics

## 2023-10-21 ENCOUNTER — Encounter: Payer: Self-pay | Admitting: Bariatrics

## 2023-10-21 ENCOUNTER — Ambulatory Visit: Payer: BC Managed Care – PPO | Admitting: Bariatrics

## 2023-10-21 VITALS — BP 125/75 | HR 76 | Temp 97.8°F | Ht 66.0 in | Wt 149.0 lb

## 2023-10-21 DIAGNOSIS — Z6824 Body mass index (BMI) 24.0-24.9, adult: Secondary | ICD-10-CM

## 2023-10-21 DIAGNOSIS — E669 Obesity, unspecified: Secondary | ICD-10-CM

## 2023-10-21 DIAGNOSIS — I10 Essential (primary) hypertension: Secondary | ICD-10-CM

## 2023-10-21 NOTE — Progress Notes (Signed)
 WEIGHT SUMMARY AND BIOMETRICS  Weight Lost Since Last Visit: 5lb  Weight Gained Since Last Visit: 0   Vitals Temp: 97.8 F (36.6 C) BP: 125/75 Pulse Rate: 76 SpO2: 99 %   Anthropometric Measurements Height: 5' 6 (1.676 m) Weight: 149 lb (67.6 kg) BMI (Calculated): 24.06 Weight at Last Visit: 154lb Weight Lost Since Last Visit: 5lb Weight Gained Since Last Visit: 0 Starting Weight: 223lb Total Weight Loss (lbs): 74 lb (33.6 kg)   Body Composition  Body Fat %: 35.3 % Fat Mass (lbs): 52.8 lbs Muscle Mass (lbs): 91.6 lbs Total Body Water  (lbs): 61.4 lbs Visceral Fat Rating : 7   Other Clinical Data Fasting: yes Labs: no Today's Visit #: 60 Starting Date: 07/03/21    OBESITY Jenna Chavez is here to discuss her progress with her obesity treatment plan along with follow-up of her obesity related diagnoses.    Nutrition Plan: the Category 2 plan - 50% adherence.  Current exercise: none  Interim History:  She is down another 5 lbs since her last visit.  Eating all of the food on the plan., Protein intake is as prescribed, Is not skipping meals, and Water  intake is adequate.   Pharmacotherapy: Jenna Chavez is on Mounjaro  5.0 mg SQ weekly Adverse side effects: None Hunger is well controlled.  Cravings are moderately controlled.  Assessment/Plan:   Hypertension Hypertension stable.  Medication(s): Bystolic  total 15 mg daily   BP Readings from Last 3 Encounters:  10/21/23 125/75  09/08/23 107/70  07/28/23 130/83   Lab Results  Component Value Date   CREATININE 1.33 (H) 12/09/2022   CREATININE 1.09 (H) 09/02/2022   CREATININE 1.08 (H) 12/31/2021    Plan: Continue all antihypertensives at current dosages. No added salt. Will keep sodium content to 1,500 mg or less per day.      Generalized Obesity: Current BMI BMI (Calculated): 24.06    Pharmacotherapy Plan Continue  Mounjaro  5.0 mg SQ weekly  Jenna Chavez is currently in the action stage of change. As such, her goal is to maintain weight for now.  She has agreed to the Category 2 plan.  Exercise goals: For substantial health benefits, adults should do at least 150 minutes (2 hours and 30 minutes) a week of moderate-intensity, or 75 minutes (1 hour and 15 minutes) a week of vigorous-intensity aerobic physical activity, or an equivalent combination of moderate- and vigorous-intensity aerobic activity. Aerobic activity should be performed in episodes of at least 10 minutes, and preferably, it should be spread throughout the week.  Behavioral modification strategies: increasing lean protein intake, no meal skipping, meal planning , increase water  intake, better snacking choices, planning for success, get rid of junk food in the home, and avoiding temptations.  Jenna Chavez has agreed to follow-up with our clinic in 4 weeks.       Objective:   VITALS: Per patient if applicable, see vitals.  GENERAL: Alert and in no acute distress. CARDIOPULMONARY: No increased WOB. Speaking in clear sentences.  PSYCH: Pleasant and cooperative. Speech normal rate and rhythm. Affect is appropriate. Insight and judgement are appropriate. Attention is focused, linear, and appropriate.  NEURO: Oriented as arrived to appointment on time with no prompting.   Attestation Statements:   This was prepared with the assistance of Engineer, Civil (consulting).  Occasional wrong-word or sound-a-like substitutions may have occurred due to the inherent limitations of voice recognition    Clayborne Daring, DO

## 2023-10-30 ENCOUNTER — Other Ambulatory Visit: Payer: Self-pay | Admitting: Bariatrics

## 2023-10-30 ENCOUNTER — Encounter: Payer: Self-pay | Admitting: Bariatrics

## 2023-10-30 ENCOUNTER — Telehealth: Payer: Self-pay | Admitting: Bariatrics

## 2023-10-30 DIAGNOSIS — I10 Essential (primary) hypertension: Secondary | ICD-10-CM

## 2023-10-30 MED ORDER — LISINOPRIL 5 MG PO TABS: 5.0000 mg | ORAL_TABLET | Freq: Every day | ORAL | 3 refills | Status: DC

## 2023-10-30 NOTE — Telephone Encounter (Signed)
 Pt need a Rx for lisinopril a 90 Day supply sen to [facility].

## 2023-11-17 ENCOUNTER — Encounter: Payer: Self-pay | Admitting: Bariatrics

## 2023-11-17 ENCOUNTER — Ambulatory Visit: Payer: BC Managed Care – PPO | Admitting: Bariatrics

## 2023-11-17 DIAGNOSIS — E669 Obesity, unspecified: Secondary | ICD-10-CM | POA: Diagnosis not present

## 2023-11-17 DIAGNOSIS — R632 Polyphagia: Secondary | ICD-10-CM

## 2023-11-17 DIAGNOSIS — Z6823 Body mass index (BMI) 23.0-23.9, adult: Secondary | ICD-10-CM

## 2023-11-17 NOTE — Progress Notes (Signed)
WEIGHT SUMMARY AND BIOMETRICS  Weight Lost Since Last Visit: 3lb  Weight Gained Since Last Visit: 0   Vitals Temp: 97.8 F (36.6 C) BP: 130/76 Pulse Rate: 77 SpO2: 99 %   Anthropometric Measurements Height: 5\' 6"  (1.676 m) Weight: 146 lb (66.2 kg) BMI (Calculated): 23.58 Weight at Last Visit: 149lb Weight Lost Since Last Visit: 3lb Weight Gained Since Last Visit: 0 Starting Weight: 223lb Total Weight Loss (lbs): 77 lb (34.9 kg)   Body Composition  Body Fat %: 35.9 % Fat Mass (lbs): 52.4 lbs Muscle Mass (lbs): 89 lbs Total Body Water (lbs): 62.4 lbs Visceral Fat Rating : 7   Other Clinical Data Fasting: yes Labs: no Today's Visit #: 30 Starting Date: 07/03/21    OBESITY Jenna Chavez is here to discuss her progress with her obesity treatment plan along with follow-up of her obesity related diagnoses.    Nutrition Plan: the Category 2 plan - 70% adherence.  Current exercise: none  Interim History:  She is down 3 lbs since her last visit.  Eating all of the food on the plan., Protein intake is as prescribed, Is not skipping meals, Water intake is inadequate., and Denies polyphagia   Pharmacotherapy: Jenna Chavez is on Mounjaro 5.0 mg SQ weekly Adverse side effects: None Hunger is moderately controlled.  Cravings are moderately controlled.  Assessment/Plan:   1. Polyphagia Jenna Chavez endorses excessive hunger.  She states that she is getting better listening to her body and knows when she is full. Medication(s): Mounjaro Effects of medication:  moderately controlled. Cravings are moderately controlled.   Plan: Medication(s): Mounjaro 5.0 mg SQ weekly Will increase water, protein and fiber to help assuage hunger.  Will minimize foods that have a high glucose index/load to minimize reactive hypoglycemia.  She will pay attention to her body cues and she  will stop eating when she is full.  She will increase her water intake and states that she can best do this by using flavor packets.    Generalized Obesity: Current BMI BMI (Calculated): 23.58   Pharmacotherapy Plan Continue  Mounjaro 5.0 mg SQ weekly  Jenna Chavez is currently in the action stage of change. As such, her goal is to maintain weight for now.  She has agreed to the Category 2 plan.  Exercise goals: For substantial health benefits, adults should do at least 150 minutes (2 hours and 30 minutes) a week of moderate-intensity, or 75 minutes (1 hour and 15 minutes) a week of vigorous-intensity aerobic physical activity, or an equivalent combination of moderate- and vigorous-intensity aerobic activity. Aerobic activity should be performed in episodes of at least 10 minutes, and preferably, it should be spread throughout the week. She will start at the gym in the near future.   Behavioral modification strategies: increasing lean protein intake, no meal skipping, meal planning , better snacking choices, planning for success, increasing vegetables, increasing  fiber rich foods, decrease snacking , avoiding temptations, keep healthy foods in the home, and mindful eating.  Jenna Chavez has agreed to follow-up with our clinic in 4 weeks.     Objective:   VITALS: Per patient if applicable, see vitals. GENERAL: Alert and in no acute distress. CARDIOPULMONARY: No increased WOB. Speaking in clear sentences.  PSYCH: Pleasant and cooperative. Speech normal rate and rhythm. Affect is appropriate. Insight and judgement are appropriate. Attention is focused, linear, and appropriate.  NEURO: Oriented as arrived to appointment on time with no prompting.   Attestation Statements:   This was prepared with the assistance of Engineer, civil (consulting).  Occasional wrong-word or sound-a-like substitutions may have occurred due to the inherent limitations of voice recognition   Corinna Capra, DO

## 2023-12-15 ENCOUNTER — Encounter: Payer: Self-pay | Admitting: Bariatrics

## 2023-12-15 ENCOUNTER — Ambulatory Visit: Payer: BC Managed Care – PPO | Admitting: Bariatrics

## 2023-12-15 VITALS — BP 128/76 | HR 75 | Temp 97.9°F | Ht 66.0 in | Wt 145.0 lb

## 2023-12-15 DIAGNOSIS — R7309 Other abnormal glucose: Secondary | ICD-10-CM

## 2023-12-15 DIAGNOSIS — I10 Essential (primary) hypertension: Secondary | ICD-10-CM | POA: Diagnosis not present

## 2023-12-15 DIAGNOSIS — E559 Vitamin D deficiency, unspecified: Secondary | ICD-10-CM | POA: Diagnosis not present

## 2023-12-15 DIAGNOSIS — R632 Polyphagia: Secondary | ICD-10-CM

## 2023-12-15 DIAGNOSIS — Z6823 Body mass index (BMI) 23.0-23.9, adult: Secondary | ICD-10-CM

## 2023-12-15 DIAGNOSIS — E785 Hyperlipidemia, unspecified: Secondary | ICD-10-CM | POA: Diagnosis not present

## 2023-12-15 DIAGNOSIS — E669 Obesity, unspecified: Secondary | ICD-10-CM

## 2023-12-15 DIAGNOSIS — E7849 Other hyperlipidemia: Secondary | ICD-10-CM

## 2023-12-15 LAB — BASIC METABOLIC PANEL WITH GFR
BUN: 15 (ref 4–21)
CO2: 23 — AB (ref 13–22)
Chloride: 106 (ref 99–108)
Creatinine: 1.1 (ref 0.5–1.1)
Glucose: 67
Potassium: 3.9 meq/L (ref 3.5–5.1)
Sodium: 139 (ref 137–147)

## 2023-12-15 LAB — HEMOGLOBIN A1C: Hemoglobin A1C: 5.1

## 2023-12-15 LAB — COMPREHENSIVE METABOLIC PANEL WITH GFR
Albumin: 4.3 (ref 3.5–5.0)
Calcium: 9.9 (ref 8.7–10.7)
Globulin: 2.3

## 2023-12-15 LAB — VITAMIN D 25 HYDROXY (VIT D DEFICIENCY, FRACTURES): Vit D, 25-Hydroxy: 59

## 2023-12-15 LAB — HEPATIC FUNCTION PANEL
Alkaline Phosphatase: 50 (ref 25–125)
Bilirubin, Total: 0.6

## 2023-12-15 LAB — LIPID PANEL
Cholesterol: 202 — AB (ref 0–200)
HDL: 78 — AB (ref 35–70)
LDL Cholesterol: 109
Triglycerides: 67 (ref 40–160)

## 2023-12-15 MED ORDER — TIRZEPATIDE 5 MG/0.5ML ~~LOC~~ SOAJ
5.0000 mg | SUBCUTANEOUS | 0 refills | Status: DC
Start: 2023-12-15 — End: 2023-12-22

## 2023-12-15 NOTE — Progress Notes (Signed)
 WEIGHT SUMMARY AND BIOMETRICS  Weight Lost Since Last Visit: 1lb  Weight Gained Since Last Visit: 0lb   Vitals Temp: 97.9 F (36.6 C) BP: 128/76 Pulse Rate: 75 SpO2: 98 %   Anthropometric Measurements Height: 5\' 6"  (1.676 m) Weight: 145 lb (65.8 kg) BMI (Calculated): 23.41 Weight at Last Visit: 146lb Weight Lost Since Last Visit: 1lb Weight Gained Since Last Visit: 0lb Starting Weight: 223lb Total Weight Loss (lbs): 78 lb (35.4 kg)   Body Composition  Body Fat %: 34.7 % Fat Mass (lbs): 50.6 lbs Muscle Mass (lbs): 90.4 lbs Total Body Water (lbs): 61.2 lbs Visceral Fat Rating : 7   Other Clinical Data Fasting: Yes Labs: Yes Today's Visit #: 31 Starting Date: 07/03/21    OBESITY Jenna Chavez is here to discuss her progress with her obesity treatment plan along with follow-up of her obesity related diagnoses.    Nutrition Plan: the Category 2 plan - 50% adherence.  Current exercise: none  Interim History:  She is down 1 lb since her last visit. She is at her goal weight.  Eating all of the food on the plan., Protein intake is as prescribed, Is skipping meals, Water intake is adequate., and Denies excessive cravings.   Pharmacotherapy: Jenna Chavez is on Mounjaro 5.0 mg SQ weekly Adverse side effects: None Hunger is well controlled.  Cravings are well controlled.  Assessment/Plan:    Hypertension:   Taking medications as directed. Controlled  Plan: Continue medications as directed. No added salt.    Hyperlipidemia:   Taking medications as directed.   Plan: Will check lipids. Continue medications. Continue diet and exercise.    Vitamin D deficiency:   Taking vitamin D OTC  Plan: Continue vitamin D. Will check vitamin D.   Polyphagia Jenna Chavez endorses excessive hunger.  Medication(s): Mounjaro Effects of medication:  well controlled.  Cravings are well controlled.   Plan: Medication(s): Mounjaro 5.0 mg SQ weekly Will increase water, protein and fiber to help assuage hunger.  Will minimize foods that have a high glucose index/load to minimize reactive hypoglycemia.   Elevated glucose:   She has a history of elevated glucose.   Plan: Will check HgbA1c and insulin.   Labs done today (CMP, Lipids, HgbA1c, insulin, vitamin D).    Generalized Obesity: Current BMI BMI (Calculated): 23.41   Pharmacotherapy Plan Continue and refill  Mounjaro 5.0 mg SQ weekly  Jenna Chavez is currently in the action stage of change. As such, her goal is to maintain her weight loss.  She has agreed to the Category 2 plan.  Exercise goals: For additional and more extensive health benefits, adults should increase their aerobic physical activity to 300 minutes (5 hours) a week of moderate-intensity, or 150 minutes a week of vigorous-intensity aerobic physical activity, or an equivalent combination of moderate- and vigorous-intensity activity. Additional health benefits are gained by engaging in physical activity  beyond this amount.   Behavioral modification strategies: increasing lean protein intake, no meal skipping, meal planning , increase water intake, better snacking choices, planning for success, avoiding temptations, and mindful eating.  Jenna Chavez has agreed to follow-up with our clinic in 6 weeks.       Objective:   VITALS: Per patient if applicable, see vitals. GENERAL: Alert and in no acute distress. CARDIOPULMONARY: No increased WOB. Speaking in clear sentences.  PSYCH: Pleasant and cooperative. Speech normal rate and rhythm. Affect is appropriate. Insight and judgement are appropriate. Attention is focused, linear, and appropriate.  NEURO: Oriented as arrived to appointment on time with no prompting.   Attestation Statements:   This was prepared with the assistance of Engineer, civil (consulting).  Occasional wrong-word or sound-a-like  substitutions may have occurred due to the inherent limitations of voice recognition   Jenna Capra, DO

## 2023-12-22 ENCOUNTER — Telehealth: Payer: Self-pay | Admitting: Bariatrics

## 2023-12-22 ENCOUNTER — Encounter: Payer: Self-pay | Admitting: Bariatrics

## 2023-12-22 ENCOUNTER — Other Ambulatory Visit: Payer: Self-pay | Admitting: Bariatrics

## 2023-12-22 DIAGNOSIS — R632 Polyphagia: Secondary | ICD-10-CM

## 2023-12-22 MED ORDER — TIRZEPATIDE 5 MG/0.5ML ~~LOC~~ SOAJ
5.0000 mg | SUBCUTANEOUS | 0 refills | Status: DC
Start: 2023-12-22 — End: 2024-03-08

## 2023-12-22 NOTE — Telephone Encounter (Signed)
 Patient stated the pharmacy told her she needed a 90 day supply, to not have to pay out of pocket. She stated stephanie sent in a 30 day supply.Greggory Keen)

## 2023-12-31 ENCOUNTER — Other Ambulatory Visit (HOSPITAL_COMMUNITY): Payer: Self-pay | Admitting: Obstetrics and Gynecology

## 2023-12-31 DIAGNOSIS — Z1231 Encounter for screening mammogram for malignant neoplasm of breast: Secondary | ICD-10-CM

## 2024-01-26 ENCOUNTER — Ambulatory Visit: Payer: BC Managed Care – PPO | Admitting: Bariatrics

## 2024-01-26 ENCOUNTER — Encounter: Payer: Self-pay | Admitting: Bariatrics

## 2024-01-26 VITALS — BP 130/82 | HR 75 | Temp 97.9°F | Ht 66.0 in | Wt 141.0 lb

## 2024-01-26 DIAGNOSIS — I10 Essential (primary) hypertension: Secondary | ICD-10-CM | POA: Diagnosis not present

## 2024-01-26 DIAGNOSIS — E785 Hyperlipidemia, unspecified: Secondary | ICD-10-CM | POA: Diagnosis not present

## 2024-01-26 DIAGNOSIS — E1169 Type 2 diabetes mellitus with other specified complication: Secondary | ICD-10-CM

## 2024-01-26 DIAGNOSIS — Z6822 Body mass index (BMI) 22.0-22.9, adult: Secondary | ICD-10-CM | POA: Diagnosis not present

## 2024-01-26 DIAGNOSIS — E669 Obesity, unspecified: Secondary | ICD-10-CM | POA: Diagnosis not present

## 2024-01-26 MED ORDER — LISINOPRIL 5 MG PO TABS: 5.0000 mg | ORAL_TABLET | Freq: Every day | ORAL | 3 refills | Status: DC

## 2024-01-26 NOTE — Progress Notes (Addendum)
 WEIGHT SUMMARY AND BIOMETRICS  Weight Lost Since Last Visit: 4lb  Weight Gained Since Last Visit: 0   Vitals Temp: 97.9 F (36.6 C) BP: 130/82 Pulse Rate: 75 SpO2: 100 %   Anthropometric Measurements Height: 5\' 6"  (1.676 m) Weight: 141 lb (64 kg) BMI (Calculated): 22.77 Weight at Last Visit: 145lb Weight Lost Since Last Visit: 4lb Weight Gained Since Last Visit: 0 Starting Weight: 223 Total Weight Loss (lbs): 82 lb (37.2 kg)   Body Composition  Body Fat %: 34.1 % Fat Mass (lbs): 48.2 lbs Muscle Mass (lbs): 88.2 lbs Total Body Water (lbs): 61 lbs Visceral Fat Rating : 7   Other Clinical Data Fasting: yes Labs: no Today's Visit #: 32 Starting Date: 07/03/21    OBESITY Jenna Chavez is here to discuss her progress with her obesity treatment plan along with follow-up of her obesity related diagnoses.    Nutrition Plan: the Category 2 plan - 60% adherence.  Current exercise: None.  Interim History:  She is down another 4 lbs since her last visit. She is not currently trying to lose weight.  Eating all of the food on the plan., Protein intake is as prescribed, Is not skipping meals, and Water intake is adequate.   Pharmacotherapy: Jenna Chavez is on Mounjaro 5.0 mg SQ weekly Adverse side effects: None Hunger is well controlled.  Cravings are well controlled.  Assessment/Plan:   Hypertension Hypertension stable.  Medication(s): Lisinopril 5 mg  BP Readings from Last 3 Encounters:  01/26/24 130/82  12/15/23 128/76  11/17/23 130/76   Lab Results  Component Value Date   CREATININE 1.33 (H) 12/09/2022   CREATININE 1.09 (H) 09/02/2022   CREATININE 1.08 (H) 12/31/2021   No results found for: "GFR"  Plan: Continue all antihypertensives at current dosages. No added salt. Will keep sodium content to 1,500 mg or less per day.    Hyperlipidemia LDL is  at goal. Medication(s): Pravastatin  Cardiovascular risk factors: diabetes mellitus, dyslipidemia, hypertension, obesity (BMI >= 30 kg/m2), and sedentary lifestyle  Lab Results  Component Value Date   CHOL 183 09/02/2022   HDL 75 09/02/2022   LDLCALC 91 09/02/2022   TRIG 97 09/02/2022   Lab Results  Component Value Date   ALT 23 12/09/2022   AST 26 12/09/2022   ALKPHOS 44 12/09/2022   BILITOT 0.4 12/09/2022   The 10-year ASCVD risk score (Arnett DK, et al., 2019) is: 8.3%   Values used to calculate the score:     Age: 58 years     Sex: Female     Is Non-Hispanic African American: Yes     Diabetic: Yes     Tobacco smoker: No     Systolic Blood Pressure: 130 mmHg     Is BP treated: Yes     HDL Cholesterol: 75 mg/dL     Total Cholesterol: 183 mg/dL  Plan:  Continue statin.  Will increase her cardiovascular exercise.  Information sheet on healthy vs unhealthy fats.  Will avoid all trans fats.  Will read labels Will minimize saturated fats except the following: low fat meats in moderation, diary, and limited dark chocolate.   Labs reviewed today (CMP, Lipids, HgbA1c, insulin).    Generalized Obesity: Current BMI BMI (Calculated): 22.77   Pharmacotherapy Plan Continue  Mounjaro 5.0 mg SQ weekly  Jenna Chavez is currently in the action stage of change. As such, her goal is to maintain weight for now.  She has agreed to the Category 2 plan.  Exercise goals: All adults should avoid inactivity. Some physical activity is better than none, and adults who participate in any amount of physical activity gain some health benefits. We discussed more walking and will do some weights.   Behavioral modification strategies: increasing lean protein intake, decreasing simple carbohydrates , no meal skipping, meal planning , and planning for success.  Jenna Chavez has agreed to follow-up with our clinic in 4 weeks.    Objective:   VITALS: Per patient if applicable, see vitals. GENERAL: Alert  and in no acute distress. CARDIOPULMONARY: No increased WOB. Speaking in clear sentences.  PSYCH: Pleasant and cooperative. Speech normal rate and rhythm. Affect is appropriate. Insight and judgement are appropriate. Attention is focused, linear, and appropriate.  NEURO: Oriented as arrived to appointment on time with no prompting.   Attestation Statements:   This was prepared with the assistance of Engineer, civil (consulting).  Occasional wrong-word or sound-a-like substitutions may have occurred due to the inherent limitations of voice recognition   Corinna Capra, DO

## 2024-01-27 ENCOUNTER — Other Ambulatory Visit: Payer: Self-pay

## 2024-02-02 ENCOUNTER — Ambulatory Visit (HOSPITAL_COMMUNITY)
Admission: RE | Admit: 2024-02-02 | Discharge: 2024-02-02 | Disposition: A | Discharge status: Home / Self Care | Source: Ambulatory Visit | Attending: Obstetrics and Gynecology | Admitting: Obstetrics and Gynecology

## 2024-02-02 DIAGNOSIS — Z1231 Encounter for screening mammogram for malignant neoplasm of breast: Secondary | ICD-10-CM | POA: Insufficient documentation

## 2024-03-08 ENCOUNTER — Encounter: Payer: Self-pay | Admitting: Bariatrics

## 2024-03-08 ENCOUNTER — Ambulatory Visit: Admitting: Bariatrics

## 2024-03-08 VITALS — BP 144/79 | HR 64 | Temp 97.8°F | Ht 66.0 in | Wt 140.0 lb

## 2024-03-08 DIAGNOSIS — E785 Hyperlipidemia, unspecified: Secondary | ICD-10-CM | POA: Diagnosis not present

## 2024-03-08 DIAGNOSIS — E1169 Type 2 diabetes mellitus with other specified complication: Secondary | ICD-10-CM

## 2024-03-08 DIAGNOSIS — E669 Obesity, unspecified: Secondary | ICD-10-CM | POA: Diagnosis not present

## 2024-03-08 DIAGNOSIS — E66812 Obesity, class 2: Secondary | ICD-10-CM

## 2024-03-08 DIAGNOSIS — R632 Polyphagia: Secondary | ICD-10-CM

## 2024-03-08 DIAGNOSIS — K219 Gastro-esophageal reflux disease without esophagitis: Secondary | ICD-10-CM

## 2024-03-08 DIAGNOSIS — Z6822 Body mass index (BMI) 22.0-22.9, adult: Secondary | ICD-10-CM

## 2024-03-08 MED ORDER — TIRZEPATIDE 5 MG/0.5ML ~~LOC~~ SOAJ
5.0000 mg | SUBCUTANEOUS | 0 refills | Status: AC
Start: 2024-03-08 — End: ?

## 2024-03-08 MED ORDER — PANTOPRAZOLE SODIUM 20 MG PO TBEC: 20.0000 mg | DELAYED_RELEASE_TABLET | Freq: Every day | ORAL | 0 refills | Status: DC

## 2024-03-08 MED ORDER — PRAVASTATIN SODIUM 40 MG PO TABS: 40.0000 mg | ORAL_TABLET | Freq: Every day | ORAL | 0 refills | Status: DC

## 2024-03-08 NOTE — Progress Notes (Signed)
 WEIGHT SUMMARY AND BIOMETRICS  Weight Lost Since Last Visit: 1lb  Weight Gained Since Last Visit: 0   Vitals Temp: 97.8 F (36.6 C) BP: (!) 144/79 Pulse Rate: 64 SpO2: 100 %   Anthropometric Measurements Height: 5\' 6"  (1.676 m) Weight: 140 lb (63.5 kg) BMI (Calculated): 22.61 Weight at Last Visit: 141lb Weight Lost Since Last Visit: 1lb Weight Gained Since Last Visit: 0 Starting Weight: 223lb Total Weight Loss (lbs): 83 lb (37.6 kg)   Body Composition  Body Fat %: 34.1 % Fat Mass (lbs): 48 lbs Muscle Mass (lbs): 88 lbs Total Body Water  (lbs): 62.2 lbs Visceral Fat Rating : 6   Other Clinical Data Fasting: yes Labs: no Today's Visit #: 35 Starting Date: 07/03/21    OBESITY Porschea is here to discuss her progress with her obesity treatment plan along with follow-up of her obesity related diagnoses.    Nutrition Plan: the Category 2 plan - 50% adherence.  Current exercise: walking  Interim History:  She is down 1 lb since her last visit.  Eating all of the food on the plan., Protein intake is as prescribed, Is skipping meals, and Water  intake is inadequate.   Pharmacotherapy: Kyomi is on Mounjaro  5.0 mg SQ weekly Adverse side effects: None Hunger is well controlled.  Cravings are well controlled.  Assessment/Plan:   Amana Bouska endorses excessive hunger.  Medication(s): Mounjaro  5 mg Effects of medication (appetite):  well controlled. Cravings are well controlled.   Plan: Medication(s): Mounjaro  5.0 mg SQ weekly Will increase water , protein and fiber to help assuage hunger.  Will minimize foods that have a high glucose index/load to minimize reactive hypoglycemia.   Gastroesophageal reflux disease without esophagitis Symptoms are well-controlled. Medication: Protonix    Plan: Rx: Protonix  20 mg 1 daily #90 with no  refills Use antacids such as Tums or Mylanta as needed. May use OTC Pepcid  along with PPI if needed. Avoid trigger foods (spicy foods, fatty/fried foods, acidic foods, coffee, alcohol). Avoid eating within 3 hours of bedtime. Continue to maintain a good healthy weight.  Hyperlipidemia LDL is not at goal. Medication(s): Pravastatin   Cardiovascular risk factors: dyslipidemia, obesity (BMI >= 30 kg/m2), and sedentary lifestyle  Lab Results  Component Value Date   CHOL 202 (A) 12/15/2023   HDL 78 (A) 12/15/2023   LDLCALC 109 12/15/2023   TRIG 67 12/15/2023   Lab Results  Component Value Date   ALT 23 12/09/2022   AST 26 12/09/2022   ALKPHOS 50 12/15/2023   BILITOT 0.4 12/09/2022   The 10-year ASCVD risk score (Arnett DK, et al., 2019) is: 12.3%*   Values used to calculate the score:     Age: 67 years     Sex: Female     Is Non-Hispanic African American: Yes     Diabetic: Yes     Tobacco smoker: No  Systolic Blood Pressure: 144 mmHg     Is BP treated: Yes     HDL Cholesterol: 78 mg/dL*     Total Cholesterol: 202 mg/dL*     * - Cholesterol units were assumed for this score calculation  Plan:  Continue statin.  Rx: Pravachol  40 mg 1 daily #90 with no refills. Discussed  healthy vs unhealthy fats.  Will avoid all trans fats.  Will read labels Will minimize saturated fats except the following: low fat meats in moderation, diary, and limited dark chocolate.    Generalized Obesity: Current BMI BMI (Calculated): 22.61   Pharmacotherapy Plan Continue and refill  Mounjaro  5.0 mg SQ weekly  Addalee is currently in the action stage of change. As such, her goal is to maintain weight for now.  She has agreed to the Category 2 plan.  Exercise goals: All adults should avoid inactivity. Some physical activity is better than none, and adults who participate in any amount of physical activity gain some health benefits.  Behavioral modification strategies: increasing lean protein  intake, decreasing simple carbohydrates , no meal skipping, decrease eating out, meal planning , increase water  intake, better snacking choices, and planning for success.  Murray has agreed to follow-up with our clinic in 4 weeks.       Objective:   VITALS: Per patient if applicable, see vitals. GENERAL: Alert and in no acute distress. CARDIOPULMONARY: No increased WOB. Speaking in clear sentences.  PSYCH: Pleasant and cooperative. Speech normal rate and rhythm. Affect is appropriate. Insight and judgement are appropriate. Attention is focused, linear, and appropriate.  NEURO: Oriented as arrived to appointment on time with no prompting.   Attestation Statements:   This was prepared with the assistance of Engineer, civil (consulting).  Occasional wrong-word or sound-a-like substitutions may have occurred due to the inherent limitations of voice recognition   Kirk Peper, DO

## 2024-04-05 ENCOUNTER — Ambulatory Visit: Admitting: Bariatrics

## 2024-04-05 ENCOUNTER — Encounter: Payer: Self-pay | Admitting: Bariatrics

## 2024-04-05 VITALS — BP 127/77 | HR 66 | Temp 97.7°F | Ht 66.0 in | Wt 137.0 lb

## 2024-04-05 DIAGNOSIS — R632 Polyphagia: Secondary | ICD-10-CM

## 2024-04-05 DIAGNOSIS — Z6822 Body mass index (BMI) 22.0-22.9, adult: Secondary | ICD-10-CM | POA: Diagnosis not present

## 2024-04-05 DIAGNOSIS — I10 Essential (primary) hypertension: Secondary | ICD-10-CM

## 2024-04-05 DIAGNOSIS — E669 Obesity, unspecified: Secondary | ICD-10-CM

## 2024-04-05 MED ORDER — TIRZEPATIDE 5 MG/0.5ML ~~LOC~~ SOAJ: 5.0000 mg | SUBCUTANEOUS | 0 refills | Status: DC

## 2024-04-05 NOTE — Progress Notes (Signed)
 WEIGHT SUMMARY AND BIOMETRICS  Weight Lost Since Last Visit: 3lb  Weight Gained Since Last Visit: 0   Vitals Temp: 97.7 F (36.5 C) BP: 127/77 Pulse Rate: 66 SpO2: 100 %   Anthropometric Measurements Height: 5' 6 (1.676 m) Weight: 137 lb (62.1 kg) BMI (Calculated): 22.12 Weight at Last Visit: 140lb Weight Lost Since Last Visit: 3lb Weight Gained Since Last Visit: 0 Starting Weight: 223lb Total Weight Loss (lbs): 86 lb (39 kg)   Body Composition  Body Fat %: 33.2 % Fat Mass (lbs): 45.8 lbs Muscle Mass (lbs): 87.4 lbs Total Body Water  (lbs): 61.6 lbs Visceral Fat Rating : 6   Other Clinical Data Fasting: yes Labs: no Today's Visit #: 52 Starting Date: 07/03/21    OBESITY Leaira is here to discuss her progress with her obesity treatment plan along with follow-up of her obesity related diagnoses.    Nutrition Plan: the Category 2 plan - 80% adherence.  Current exercise: none  Interim History: She is down an additional 5 lbs since her last visit.  She has 2 family members that have been sick and she has been back and forth to the hospital as well as of her visits and has not been eating as consistently.  She wants to get back on track.  Eating all of the food on the plan., Protein intake is as prescribed, Is skipping meals, and Water  intake is adequate.   Pharmacotherapy: Imagene is on Mounjaro  5.0 mg SQ weekly Adverse side effects: None Hunger is moderately controlled.  Cravings are moderately controlled.  Assessment/Plan:   Mehek Grega endorses excessive hunger.  Medication(s): Mounjaro  Effects of medication:  moderately controlled. Cravings are moderately controlled.   Plan: Medication(s): Mounjaro  5.0 mg SQ weekly Will increase water , protein and fiber to help assuage hunger.  She will increase her protein from 80 to 100/120  g/day.  She will have a protein shake if she is not able to eat all of her protein. Will minimize foods that have a high glucose index/load to minimize reactive hypoglycemia.   Hypertension Hypertension well controlled.  Medication(s): Bystolic  5 mg and Lisinopril  5 mg  BP Readings from Last 3 Encounters:  04/05/24 127/77  03/08/24 (!) 144/79  01/26/24 130/82   Lab Results  Component Value Date   CREATININE 1.1 12/15/2023   CREATININE 1.33 (H) 12/09/2022   CREATININE 1.09 (H) 09/02/2022   No results found for: GFR  Plan: Continue all antihypertensives at current dosages. No added salt. Will keep sodium content to 1,500 mg or less per day.   She will let select foods that are less processed.     Generalized Obesity: Current BMI BMI (Calculated): 22.12   Pharmacotherapy Plan Continue and refill  Mounjaro  5.0 mg SQ weekly  Eyla is currently in the action stage of change. As such, her goal is to maintain her weight. She  will get back on track with her meal plan.  She has agreed to the Category 2 plan.  Exercise goals: For substantial health benefits, adults should do at least 150 minutes (2 hours and 30 minutes) a week of moderate-intensity, or 75 minutes (1 hour and 15 minutes) a week of vigorous-intensity aerobic physical activity, or an equivalent combination of moderate- and vigorous-intensity aerobic activity. Aerobic activity should be performed in episodes of at least 10 minutes, and preferably, it should be spread throughout the week.  Behavioral modification strategies: better snacking choices, planning for success, emotional eating strategies, keep healthy foods in the home, travel eating strategies, increase frequency of journaling, weigh protein portions, measure portion sizes, and mindful eating.  Jayah has agreed to follow-up with our clinic in 4 weeks.      Objective:   VITALS: Per patient if applicable, see vitals. GENERAL: Alert and in no acute  distress. CARDIOPULMONARY: No increased WOB. Speaking in clear sentences.  PSYCH: Pleasant and cooperative. Speech normal rate and rhythm. Affect is appropriate. Insight and judgement are appropriate. Attention is focused, linear, and appropriate.  NEURO: Oriented as arrived to appointment on time with no prompting.   Attestation Statements:   This was prepared with the assistance of Engineer, civil (consulting).  Occasional wrong-word or sound-a-like substitutions may have occurred due to the inherent limitations of voice recognition   Kirk Peper, DO

## 2024-05-03 ENCOUNTER — Telehealth: Payer: Self-pay | Admitting: Gastroenterology

## 2024-05-03 ENCOUNTER — Other Ambulatory Visit: Payer: Self-pay | Admitting: *Deleted

## 2024-05-03 DIAGNOSIS — K769 Liver disease, unspecified: Secondary | ICD-10-CM

## 2024-05-03 DIAGNOSIS — K862 Cyst of pancreas: Secondary | ICD-10-CM

## 2024-05-03 NOTE — Telephone Encounter (Signed)
 Orders faxed to [phone] [facility] for MRI/MRCP with and without contrast for August. They will contact pt to schedule.

## 2024-05-03 NOTE — Telephone Encounter (Signed)
 Received notification of expiring MRI. Upon review of chart, she is due surveillance MRI Abd/MRCP with and without contrast for pancreatic cyst/liver lesion in 05/2024. I do not see a recall.   Let's go ahead and get scheduled for her mri.

## 2024-05-03 NOTE — Telephone Encounter (Signed)
 noted

## 2024-05-04 NOTE — Telephone Encounter (Signed)
 Pt states she has been scheduled for MRI/MRCP on 05/18/24.

## 2024-05-07 NOTE — Telephone Encounter (Signed)
 noted

## 2024-05-17 ENCOUNTER — Encounter: Payer: Self-pay | Admitting: Bariatrics

## 2024-05-17 ENCOUNTER — Ambulatory Visit: Admitting: Bariatrics

## 2024-05-17 VITALS — BP 134/81 | HR 67 | Temp 97.6°F | Ht 66.0 in | Wt 136.0 lb

## 2024-05-17 DIAGNOSIS — E785 Hyperlipidemia, unspecified: Secondary | ICD-10-CM

## 2024-05-17 DIAGNOSIS — E669 Obesity, unspecified: Secondary | ICD-10-CM

## 2024-05-17 DIAGNOSIS — I10 Essential (primary) hypertension: Secondary | ICD-10-CM | POA: Diagnosis not present

## 2024-05-17 DIAGNOSIS — K219 Gastro-esophageal reflux disease without esophagitis: Secondary | ICD-10-CM | POA: Diagnosis not present

## 2024-05-17 DIAGNOSIS — R632 Polyphagia: Secondary | ICD-10-CM | POA: Diagnosis not present

## 2024-05-17 DIAGNOSIS — E1169 Type 2 diabetes mellitus with other specified complication: Secondary | ICD-10-CM

## 2024-05-17 DIAGNOSIS — Z6821 Body mass index (BMI) 21.0-21.9, adult: Secondary | ICD-10-CM

## 2024-05-17 MED ORDER — PANTOPRAZOLE SODIUM 20 MG PO TBEC: 20.0000 mg | DELAYED_RELEASE_TABLET | Freq: Every day | ORAL | 0 refills | Status: DC

## 2024-05-17 MED ORDER — TIRZEPATIDE 5 MG/0.5ML ~~LOC~~ SOAJ: 5.0000 mg | SUBCUTANEOUS | 0 refills | Status: DC

## 2024-05-17 MED ORDER — PRAVASTATIN SODIUM 40 MG PO TABS: 40.0000 mg | ORAL_TABLET | Freq: Every day | ORAL | 0 refills | Status: DC

## 2024-05-17 MED ORDER — LISINOPRIL 5 MG PO TABS: 5.0000 mg | ORAL_TABLET | Freq: Every day | ORAL | 0 refills | Status: DC

## 2024-05-17 NOTE — Progress Notes (Signed)
 WEIGHT SUMMARY AND BIOMETRICS  Weight Lost Since Last Visit: 1lb  Weight Gained Since Last Visit: 0   Vitals Temp: 97.6 F (36.4 C) BP: 134/81 Pulse Rate: 67 SpO2: 100 %   Anthropometric Measurements Height: 5' 6 (1.676 m) Weight: 136 lb (61.7 kg) BMI (Calculated): 21.96 Weight at Last Visit: 137lb Weight Lost Since Last Visit: 1lb Weight Gained Since Last Visit: 0 Starting Weight: 223lb Total Weight Loss (lbs): 87 lb (39.5 kg)   Body Composition  Body Fat %: 32.1 % Fat Mass (lbs): 43.8 lbs Muscle Mass (lbs): 87.6 lbs Total Body Water  (lbs): 60.8 lbs Visceral Fat Rating : 6   Other Clinical Data Fasting: yes Labs: no Today's Visit #: 35 Starting Date: 07/03/21    OBESITY Jenna Chavez is here to discuss her progress with her obesity treatment plan along with follow-up of her obesity related diagnoses.    Nutrition Plan: the Category 2 plan - 70% adherence.  Current exercise: Walking  Interim History:  She is 1 lb since her last visit.  Eating all of the food on the plan., Protein intake is as prescribed, Water  intake is adequate., and Denies polyphagia   Pharmacotherapy: Jenna Chavez is on Mounjaro  5.0 mg SQ weekly Adverse side effects: None Hunger is moderately controlled.  Cravings are moderately controlled.  Assessment/Plan:   Jenna Chavez endorses excessive hunger.  Medication(s): Mounjaro  5 mg Effects of medication:  moderately controlled. Cravings are moderately controlled.   Plan: Medication(s): Mounjaro  5.0 mg SQ weekly Will increase water , protein and fiber to help assuage hunger.  Will minimize foods that have a high glucose index/load to minimize reactive hypoglycemia.   Hypertension Hypertension stable.  Medication(s): Lisinopril  5 mg  BP Readings from Last 3 Encounters:  05/17/24 134/81  04/05/24 127/77  03/08/24 (!)  144/79   Lab Results  Component Value Date   CREATININE 1.1 12/15/2023   CREATININE 1.33 (H) 12/09/2022   CREATININE 1.09 (H) 09/02/2022   No results found for: GFR  Plan: Continue all antihypertensives at current dosages. No added salt. Will keep sodium content to 1,500 mg or less per day.   Rx: Lisinopril  5 mg 1 p.o. daily 90 with no refills.  Gastroesophageal reflux disease without esophagitis Symptoms are well-controlled. Medication: Pravachol   Plan: Continue PPI. Use antacids such as Tums or Mylanta as needed. May use OTC Pepcid  along with PPI if needed. Avoid trigger foods (spicy foods, fatty/fried foods, acidic foods, coffee, alcohol). Avoid eating within 3 hours of bedtime. Rx: Pantoprazole  20 mg 1 p.o. daily #90 with no refills.  Hyperlipidemia LDL is not at goal. Medication(s): Pravachol  40 mg Cardiovascular risk factors: diabetes mellitus, dyslipidemia, hypertension, and sedentary lifestyle  Lab Results  Component Value Date   CHOL 202 (A) 12/15/2023   HDL 78 (A) 12/15/2023   LDLCALC 109 12/15/2023   TRIG 67 12/15/2023   Lab Results  Component Value Date   ALT 23 12/09/2022   AST 26 12/09/2022   ALKPHOS 50 12/15/2023   BILITOT 0.4 12/09/2022   The 10-year ASCVD risk score (Arnett DK, et al., 2019) is: 9.7%*   Values used to calculate the score:     Age: 58 years     Clincally relevant sex: Female     Is Non-Hispanic African American: Yes     Diabetic: Yes     Tobacco smoker: No     Systolic Blood Pressure: 134 mmHg     Is BP treated: Yes     HDL Cholesterol: 78 mg/dL*     Total Cholesterol: 202 mg/dL*     * - Cholesterol units were assumed for this score calculation  Plan:  Rx: Pravachol  40 mg 1 p.o. daily #90 with no refills. Information sheet on healthy vs unhealthy fats.  Will avoid all trans fats.  Will read labels Will minimize saturated fats except the following: low fat meats in moderation, diary, and limited dark chocolate.   Increase Omega 3 in foods, and consider an Omega 3 supplement.    Generalized Obesity: Current BMI BMI (Calculated): 21.96   Pharmacotherapy Plan Continue and refill  Mounjaro  5.0 mg SQ weekly  Jenna Chavez is currently in the action stage of change.  She will work on maintaining her weight.  She does not need to lose any additional weight at this time. She has agreed to the Category 2 plan.  Exercise goals: For substantial health benefits, adults should do at least 150 minutes (2 hours and 30 minutes) a week of moderate-intensity, or 75 minutes (1 hour and 15 minutes) a week of vigorous-intensity aerobic physical activity, or an equivalent combination of moderate- and vigorous-intensity aerobic activity. Aerobic activity should be performed in episodes of at least 10 minutes, and preferably, it should be spread throughout the week.  Behavioral modification strategies: increasing lean protein intake, no meal skipping, decrease eating out, meal planning , increase water  intake, better snacking choices, increasing vegetables, avoiding temptations, keep healthy foods in the home, and measure portion sizes.  Jenna Chavez has agreed to follow-up with our clinic in 4 weeks.       Objective:   VITALS: Per patient if applicable, see vitals. GENERAL: Alert and in no acute distress. CARDIOPULMONARY: No increased WOB. Speaking in clear sentences.  PSYCH: Pleasant and cooperative. Speech normal rate and rhythm. Affect is appropriate. Insight and judgement are appropriate. Attention is focused, linear, and appropriate.  NEURO: Oriented as arrived to appointment on time with no prompting.   Attestation Statements:   This was prepared with the assistance of Engineer, civil (consulting).  Occasional wrong-word or sound-a-like substitutions may have occurred due to the inherent limitations of voice recognition   Clayborne Daring, DO

## 2024-05-26 ENCOUNTER — Telehealth: Payer: Self-pay | Admitting: Gastroenterology

## 2024-05-26 NOTE — Telephone Encounter (Signed)
 Patient called to say that she saw the results of her test and wanted to know should she go ahead and make an appointment or would someone call with the results.  There are no available appointments until the middle of September with Sonny and I didn't know if she would need to wait that long to speak with someone about the results.

## 2024-05-26 NOTE — Telephone Encounter (Signed)
 Spoke with pt and made her aware that Sonny Kerns PA-C is out of the office until Monday 05/31/2024 and that she will be reviewing those results upon her return and that I will contact her with Leslie's recommendations. Pt verbalized understanding.   SonnyBETHA LIPS results are scanned under meda for review.

## 2024-06-02 ENCOUNTER — Other Ambulatory Visit: Payer: Self-pay

## 2024-06-02 DIAGNOSIS — K862 Cyst of pancreas: Secondary | ICD-10-CM

## 2024-06-02 NOTE — Telephone Encounter (Signed)
 I have reviewed MRI pancreas. Radiology is advising further imaging possible endoscopic u/s which is not done locally.   I have not seen her seen 02/2023. Can we please get her into the office within the next one week so I can reassess her and discuss EUS. We will then make referral. You may use an urgent spot or a new patient spot but not a double book spot.   Please have her complete the following labs in the next 1-2 days so we can have them back before her ov.  CBC, CMET, lipase, CA 19-9.

## 2024-06-02 NOTE — Telephone Encounter (Signed)
 Pt was made aware and verbalized understanding. Labs have been ordered, pt has been advised to have done no later than Friday. F/u appt was made as well.

## 2024-06-03 LAB — COMPREHENSIVE METABOLIC PANEL WITH GFR
AG Ratio: 1.9 (calc) (ref 1.0–2.5)
ALT: 12 U/L (ref 6–29)
AST: 13 U/L (ref 10–35)
Albumin: 4.2 g/dL (ref 3.6–5.1)
Alkaline phosphatase (APISO): 41 U/L (ref 37–153)
BUN: 16 mg/dL (ref 7–25)
CO2: 25 mmol/L (ref 20–32)
Calcium: 9.9 mg/dL (ref 8.6–10.4)
Chloride: 106 mmol/L (ref 98–110)
Creat: 0.98 mg/dL (ref 0.50–1.03)
Globulin: 2.2 g/dL (ref 1.9–3.7)
Glucose, Bld: 82 mg/dL (ref 65–99)
Potassium: 3.8 mmol/L (ref 3.5–5.3)
Sodium: 139 mmol/L (ref 135–146)
Total Bilirubin: 0.8 mg/dL (ref 0.2–1.2)
Total Protein: 6.4 g/dL (ref 6.1–8.1)
eGFR: 68 mL/min/1.73m2 (ref >=60)

## 2024-06-03 LAB — CANCER ANTIGEN 19-9: CA 19-9: 35 U/mL — ABNORMAL HIGH (ref <34)

## 2024-06-03 LAB — CBC WITH DIFFERENTIAL/PLATELET
Absolute Lymphocytes: 3469 {cells}/uL (ref 850–3900)
Absolute Monocytes: 339 {cells}/uL (ref 200–950)
Basophils Absolute: 51 {cells}/uL (ref 0–200)
Basophils Relative: 0.8 %
Eosinophils Absolute: 90 {cells}/uL (ref 15–500)
Eosinophils Relative: 1.4 %
HCT: 37.7 % (ref 35.0–45.0)
Hemoglobin: 12.4 g/dL (ref 11.7–15.5)
MCH: 29.9 pg (ref 27.0–33.0)
MCHC: 32.9 g/dL (ref 32.0–36.0)
MCV: 90.8 fL (ref 80.0–100.0)
MPV: 11.7 fL (ref 7.5–12.5)
Monocytes Relative: 5.3 %
Neutro Abs: 2451 {cells}/uL (ref 1500–7800)
Neutrophils Relative %: 38.3 %
Platelets: 303 Thousand/uL (ref 140–400)
RBC: 4.15 Million/uL (ref 3.80–5.10)
RDW: 13.9 % (ref 11.0–15.0)
Total Lymphocyte: 54.2 %
WBC: 6.4 Thousand/uL (ref 3.8–10.8)

## 2024-06-03 LAB — LIPASE: Lipase: 33 U/L (ref 7–60)

## 2024-06-08 ENCOUNTER — Ambulatory Visit: Admitting: Gastroenterology

## 2024-06-08 ENCOUNTER — Encounter: Payer: Self-pay | Admitting: Gastroenterology

## 2024-06-08 VITALS — BP 143/84 | HR 71 | Temp 99.0°F | Ht 66.5 in | Wt 141.4 lb

## 2024-06-08 DIAGNOSIS — K869 Disease of pancreas, unspecified: Secondary | ICD-10-CM | POA: Diagnosis not present

## 2024-06-08 DIAGNOSIS — K5909 Other constipation: Secondary | ICD-10-CM

## 2024-06-08 DIAGNOSIS — K5904 Chronic idiopathic constipation: Secondary | ICD-10-CM

## 2024-06-08 NOTE — Progress Notes (Signed)
 GI Office Note    Referring Provider: Bertell Satterfield, MD Primary Care Physician:  Bertell Satterfield, MD  Primary Gastroenterologist: Ozell Hollingshead, MD   Chief Complaint   Chief Complaint  Patient presents with   Follow-up    History of Present Illness   Jenna Chavez is a 58 y.o. female presenting today for follow-up.  I last saw her in May 2024.  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 Initial MRI showing simple appearing cystic lesion measuring 1.6 X 1.1 cm, not see on prior CT in 2020. 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 MRI six months advised.  Completed MRI abdomen/MRCP in August 2024.  This study was done at [facility] (see Care Everywhere).  Pancreatic lesion decreased in size from 1.7 X 1.3 cm to 0.6 X 0.5 cm and may have been resolving area of focal pancreatitis or resolving pseudocyst. She has normal variant anatomy, pancreas divisum. She has what is suspected to be benign liver lesion right liver lobe. No bile duct dilation by MRI.  We recommended a 1 year follow-up MRI.  She completed MRI/MRCP 05/18/24, done at [facility] (see Care Everywhere).  Images were compared to her MRI 1 year prior as well as the February 2024 MRI.  She had similar mild enlargement of the proximal common bile duct, no choledocholithiasis or obstructing lesion.  Pancreas divisum noted.  Lesion in the pancreatic body measuring 1.3 x 1.8 cm. Features not consistent with a cyst.  Could reflect focal pancreatitis given fluctuating appearance over previous MRI studies though mass could give similar appearance.  EUS/FNA recommended.  Recent labs completed at our request.  CBC, CMET, lipase all normal.  CA 19 9 slightly elevated at 35 (less than 34 normal).  Discussed the use of AI scribe  software for clinical note transcription with the patient, who gave verbal consent to proceed.   She has been monitored for a pancreatic cyst since early 2024. Initial imaging showed a reduction in size, but it has since returned to its original dimensions.  Despite having pancreas divisum, she has not been diagnosed with pancreatitis.  She experiences intermittent abdominal pain on both sides of her upper abdomen and sometimes into the back pain, which she is unsure if it is related to her spine. The pain is not constant and does not worsen with eating. She has had similar pain for years and has pain most days. No N/V. No heartburn. She has a long-standing history of constipation for decades. Has bowel movements approximately twice a week. She occasionally uses Miralax  but not consistently. No melena, brbpr.   She is currently taking Mounjaro  5 mg weekly for weight management, which she started in November 2024. Prior to that she was on ozempic . At her heaviest she was around 226 pounds in 2022. She has been in the 140 range for 8 months. Uses Mounjaro  every 1-2 weeks. No nausea, vomiting, or pain exacerbated by eating or Mounjaro .     Prior Data    CT A/P with contrast 12/09/22: IMPRESSION: 1. No CT evidence for acute intra-abdominal or pelvic abnormality. 2. Status post cholecystectomy. Increased diameter of common bile duct compared to prior, measuring up to 12 mm on coronal images. This may be correlated with LFTs and follow-up MRCP as indicated. 3. Aortic  atherosclerosis.   MR Abd/MRCP 12/10/2022: IMPRESSION: -Prior cholecystectomy. Borderline dilatation common bile duct, without evidence of choledocholithiasis or other obstructing etiology. -Small benign hepatic hemangioma. 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 Recommend continued follow-up by MRI in 6  months. This recommendation follows ACR consensus guidelines: Management of Incidental Pancreatic Cysts: A White Paper of the ACR Incidental Findings Committee. JINNY [employer] Coll Radiol 2017;14:911-923.  05/2023: MRI Abd MRCP WO W contrast: IMPRESSION:  1. Previously seen lesion in the pancreatic body has decreased significantly in size which would suggest a benign etiology such as a resolving area of focal pancreatitis or resolving pseudocyst.  2.  Stable small area of enhancement in the dome of the liver is suggestive of a small hemangioma or possibly vascular shunt. Subcentimeter enhancing lesion laterally in the right hepatic lobe is also stable and likely benign.  3.  Pancreas divisum which is a normal variant.   04/2024: MRI Abd MRCP WO W contrast IMPRESSION:  Focal signal abnormality of the pancreatic body as above. Features not consistent with cyst. This may reflect focal pancreatitis given the fluctuating appearance over previous MRI studies, though mass could appear similar. Recommend EUS/FNA for further evaluation.    Egd 01/2023: few gastric polyps, fundic gland polyps. No h.pylori.   Colonoscopy 03/2022: -normal -repeat colonoscopy in five years     Medications   Current Outpatient Medications  Medication Sig Dispense Refill   Cholecalciferol (VITAMIN D3) 50 MCG (2000 UT) TABS Take 2,000 Units by mouth daily.     docusate sodium  (COLACE) 100 MG capsule Take 100 mg by mouth daily as needed for moderate constipation.     lisinopril  (ZESTRIL ) 5 MG tablet Take 1 tablet (5 mg total) by mouth daily. 90 tablet 0   nebivolol  (BYSTOLIC ) 10 MG tablet TAKE 1 TABLET BY MOUTH EVERY DAY 90 tablet 3   nebivolol  (BYSTOLIC ) 5 MG tablet Take 5 mg by mouth daily.     pantoprazole  (PROTONIX ) 20 MG tablet Take 1 tablet (20 mg total) by mouth daily. 90 tablet 0   polyethylene glycol (MIRALAX  / GLYCOLAX ) 17 g packet Take 17 g by mouth daily as needed for moderate constipation.     pravastatin   (PRAVACHOL ) 40 MG tablet Take 1 tablet (40 mg total) by mouth daily. 90 tablet 0   tirzepatide  (MOUNJARO ) 5 MG/0.5ML Pen Inject 5 mg into the skin once a week. 6 mL 0   No current facility-administered medications for this visit.    Allergies   Allergies as of 06/08/2024 - Review Complete 06/08/2024  Allergen Reaction Noted   Hydrocodone   12/12/2018   Dilaudid  [hydromorphone  hcl] Palpitations 12/12/2018      Review of Systems   General: Negative for anorexia, weight loss, fever, chills, fatigue, weakness. ENT: Negative for hoarseness, difficulty swallowing , nasal congestion. CV: Negative for chest pain, angina, palpitations, dyspnea on exertion, peripheral edema.  Respiratory: Negative for dyspnea at rest, dyspnea on exertion, cough, sputum, wheezing.  GI: See history of present illness. GU:  Negative for dysuria, hematuria, urinary incontinence, urinary frequency, nocturnal urination.  Endo: Negative for unusual weight change.     Physical Exam   BP (!) 143/84 (BP Location: Right Arm, Patient Position: Sitting, Cuff Size: Normal)   Pulse 71   Temp 99 F (37.2 C) (Oral)   Ht 5' 6.5 (1.689 m)   Wt 141 lb 6.4 oz (64.1  kg)   SpO2 99%   BMI 22.48 kg/m    General: Well-nourished, well-developed in no acute distress.  Eyes: No icterus. Mouth: Oropharyngeal mucosa moist and pink   Lungs: Clear to auscultation bilaterally.  Heart: Regular rate and rhythm, no murmurs rubs or gallops.  Abdomen: Bowel sounds are normal, nontender, nondistended, no hepatosplenomegaly or masses,  no abdominal bruits or hernia , no rebound or guarding.  Rectal: not performed Extremities: No lower extremity edema. No clubbing or deformities. Neuro: Alert and oriented x 4   Skin: Warm and dry, no jaundice.   Psych: Alert and cooperative, normal mood and affect.  Labs   See hpi  Imaging Studies   No results found.  Assessment/Plan:        Pancreatic cyst: The pancreatic cyst has  remained overall stable in size since February 2024, measuring approximately 1.3 by 1.8 cm. Its nature is uncertain, appearing less cyst-like and possibly representing focal inflammation, although mass can give similar appearance. CA 19-9 minimally elevated. - will reach out to Dr. Wilhelmenia regarding possible endoscopic ultrasound (EUS) to further evaluate the pancreatic lesion.   Chronic constipation: Chronic constipation poorly controlled. Current management with Miralax  is inconsistent. - Instruct to take Miralax  17 grams twice daily until achieving a good soft stool, then reduce to once daily.          Sonny RAMAN. Ezzard, MHS, PA-C [facility]

## 2024-06-08 NOTE — Patient Instructions (Signed)
 I will reach out to Dr. Wilhelmenia at [facility] to discuss pancreatic findings. If he advises for an endoscopic ultrasound, his office will contact you directly.   Start back on miralax  17 grams two capfuls daily until soft stool, then decrease to one capful daily to maintain regular stools.  Hold off on Mounjaro  until we find out if you need the endoscopic ultrasound.   Please let me know if you have any change in symptoms.

## 2024-06-13 ENCOUNTER — Ambulatory Visit: Payer: Self-pay | Admitting: Gastroenterology

## 2024-06-13 DIAGNOSIS — K869 Disease of pancreas, unspecified: Secondary | ICD-10-CM | POA: Insufficient documentation

## 2024-06-14 ENCOUNTER — Ambulatory Visit: Admitting: Bariatrics

## 2024-06-28 ENCOUNTER — Encounter: Payer: Self-pay | Admitting: Cardiology

## 2024-06-28 ENCOUNTER — Ambulatory Visit: Attending: Cardiology | Admitting: Cardiology

## 2024-06-28 ENCOUNTER — Telehealth: Payer: Self-pay

## 2024-06-28 VITALS — BP 130/80 | HR 79 | Ht 66.5 in | Wt 138.8 lb

## 2024-06-28 DIAGNOSIS — R002 Palpitations: Secondary | ICD-10-CM

## 2024-06-28 DIAGNOSIS — I10 Essential (primary) hypertension: Secondary | ICD-10-CM

## 2024-06-28 DIAGNOSIS — E782 Mixed hyperlipidemia: Secondary | ICD-10-CM

## 2024-06-28 NOTE — Telephone Encounter (Signed)
 Pt called to follow up with you regarding you talking to Dr. Wilhelmenia. Please advise.

## 2024-06-28 NOTE — Telephone Encounter (Signed)
 Please let her know I did talk with Dr. Wilhelmenia. He requested films from [facility] to be loaded up to the [facility] system so he could see them. I requested that last week, so once that happens, Dr. Wilhelmenia will advise. Hopefully will know something this week.

## 2024-06-28 NOTE — Telephone Encounter (Signed)
 Pt was made aware and verbalized understanding.

## 2024-06-28 NOTE — Patient Instructions (Signed)
 Medication Instructions:  Your physician recommends that you continue on your current medications as directed. Please refer to the Current Medication list given to you today.   Labwork: None today  Testing/Procedures: None today  Follow-Up: 1 year  Any Other Special Instructions Will Be Listed Below (If Applicable).  If you need a refill on your cardiac medications before your next appointment, please call your pharmacy.

## 2024-06-28 NOTE — Progress Notes (Signed)
    Cardiology Office Note  Date: 06/28/2024   ID: Jenna Chavez, DOB 1966-06-19, MRN [id number]  History of Present Illness: Jenna Chavez is a 58 y.o. female last seen in August 2024.  She is here for a follow-up visit.  She does not report any progressive palpitations, no dizziness or syncope.  No claudication or lower extremity ulcerations.  She reports feeling a prominent superficial vein in her left leg sometimes.  Her distal pulses are full.  Medications reviewed.  She reports compliance with current regimen.  In the process of looking for a new PCP given pending retirement of Dr. Bertell.  She remains on maintenance dose Mounjaro .  Overall has lost 86 pounds initially through diet and subsequently with medication.  I reviewed her ECG today which shows sinus rhythm with nonspecific T wave changes.  Physical Exam: VS:  BP 130/80 (BP Location: Left Arm, Cuff Size: Normal)   Pulse 79   Ht 5' 6.5 (1.689 m)   Wt 138 lb 12.8 oz (63 kg)   BMI 22.07 kg/m , BMI Body mass index is 22.07 kg/m.  Wt Readings from Last 3 Encounters:  06/28/24 138 lb 12.8 oz (63 kg)  06/08/24 141 lb 6.4 oz (64.1 kg)  05/17/24 136 lb (61.7 kg)    General: Patient appears comfortable at rest. HEENT: Conjunctiva and lids normal. Neck: Supple, no elevated JVP or carotid bruits. Lungs: Clear to auscultation, nonlabored breathing at rest. Cardiac: Regular rate and rhythm, no S3 or significant systolic murmur. Extremities: No pitting edema, distal pulses 2+.  ECG:  An ECG dated 12/09/2022 was personally reviewed today and demonstrated:  Sinus rhythm.  Labwork: 06/02/2024: ALT 12; AST 13; BUN 16; Creat 0.98; Hemoglobin 12.4; Platelets 303; Potassium 3.8; Sodium 139     Component Value Date/Time   CHOL 202 (A) 12/15/2023 0000   CHOL 183 09/02/2022 1050   TRIG 67 12/15/2023 0000   HDL 78 (A) 12/15/2023 0000   HDL 75 09/02/2022 1050   LDLCALC 109 12/15/2023 0000   LDLCALC 91 09/02/2022 1050   Other  Studies Reviewed Today:  No interval cardiac testing for review today.  Assessment and Plan:  1.  Palpitations.  No increasing pattern or associated syncope.  ECG reviewed and stable.  Continue observation at this point.  Cardiac monitor from 2021 revealed rare PACs and PVCs, less than 1% total beats.   2.  Primary hypertension.  In the process of establishing with a new PCP.  She is currently on lisinopril  5 mg daily and Bystolic  15 mg daily.   3.  Mixed hyperlipidemia.  LDL 109 and HDL 78 in February.  She continues on Pravachol  40 mg daily.  Disposition:  Follow up 1 year.  Signed, Jenna Chavez, M.D., F.A.C.C. [facility]

## 2024-07-09 ENCOUNTER — Telehealth: Payer: Self-pay | Admitting: Gastroenterology

## 2024-07-09 DIAGNOSIS — K869 Disease of pancreas, unspecified: Secondary | ICD-10-CM

## 2024-07-09 NOTE — Telephone Encounter (Signed)
 Can you please help me with getting patient's MRI images from [facility]? Dr. Wilhelmenia needs to have them powershared from [facility] to [place]. He said when his staff does it, they call the place where the MRI took place and ask them if they can Powershare directly to [facility].  She had TWO MRI Abd MRCP at [facility] (see [facility]), one from last year and this year On 05/26/2023 [facility] [address]) On 05/18/2024 you may have to ask where she had this one done, Tammy R faxed the order in but doesn't say which facility   If you can try today, it would be so helpful.

## 2024-07-09 NOTE — Telephone Encounter (Signed)
 Pt was made aware and verbalized understanding.

## 2024-07-09 NOTE — Telephone Encounter (Signed)
 [facility] and they told me that I would need to call the file room at [facility] at [phone] and request for them to do it. I called that number and it was an automated system that told me a records request would need to be faxed over on letter head. I faxed over a request to [phone] requesting the two MRI abd MRCP be powershared from [facility] to [facility].

## 2024-07-09 NOTE — Telephone Encounter (Signed)
 Thank you :)

## 2024-07-09 NOTE — Telephone Encounter (Signed)
 Please let patient know we are still trying. I tried first by going through [place] and had no luck. Discussed with Dr. Wilhelmenia on how he obtains films. See the other telephone today.

## 2024-07-18 ENCOUNTER — Other Ambulatory Visit: Payer: Self-pay | Admitting: Gastroenterology

## 2024-07-19 ENCOUNTER — Encounter: Payer: Self-pay | Admitting: Bariatrics

## 2024-07-19 ENCOUNTER — Ambulatory Visit: Admitting: Bariatrics

## 2024-07-19 VITALS — BP 124/79 | HR 66 | Temp 97.9°F | Ht 66.0 in | Wt 133.0 lb

## 2024-07-19 DIAGNOSIS — E669 Obesity, unspecified: Secondary | ICD-10-CM

## 2024-07-19 DIAGNOSIS — Z7985 Long-term (current) use of injectable non-insulin antidiabetic drugs: Secondary | ICD-10-CM

## 2024-07-19 DIAGNOSIS — Z6821 Body mass index (BMI) 21.0-21.9, adult: Secondary | ICD-10-CM | POA: Diagnosis not present

## 2024-07-19 DIAGNOSIS — E785 Hyperlipidemia, unspecified: Secondary | ICD-10-CM | POA: Diagnosis not present

## 2024-07-19 DIAGNOSIS — E119 Type 2 diabetes mellitus without complications: Secondary | ICD-10-CM | POA: Diagnosis not present

## 2024-07-19 DIAGNOSIS — E1169 Type 2 diabetes mellitus with other specified complication: Secondary | ICD-10-CM

## 2024-07-19 NOTE — Progress Notes (Signed)
 WEIGHT SUMMARY AND BIOMETRICS  Weight Lost Since Last Visit: 3lb  Weight Gained Since Last Visit: 0   Vitals Temp: 97.9 F (36.6 C) BP: 124/79 Pulse Rate: 66 SpO2: 100 %   Anthropometric Measurements Height: 5' 6 (1.676 m) Weight: 133 lb (60.3 kg) BMI (Calculated): 21.48 Weight at Last Visit: 136lb Weight Lost Since Last Visit: 3lb Weight Gained Since Last Visit: 0 Starting Weight: 223lb Total Weight Loss (lbs): 90 lb (40.8 kg)   Body Composition  Body Fat %: 31.9 % Fat Mass (lbs): 42.4 lbs Muscle Mass (lbs): 86.2 lbs Total Body Water  (lbs): 59.4 lbs Visceral Fat Rating : 6   Other Clinical Data Fasting: yes Labs: no Today's Visit #: 61 Starting Date: 07/03/21    OBESITY Jenna Chavez is here to discuss her progress with her obesity treatment plan along with follow-up of her obesity related diagnoses.    Nutrition Plan: the Category 2 plan - 80% adherence.  Current exercise: walking  Interim History:  She is down another 3 lbs since her last visit. She is under a lot of stress with sickness in the family and her eating has been inconsistent. Eating all of the food on the plan., Protein intake is as prescribed, and Water  intake is adequate.   Pharmacotherapy: Jenna Chavez is on Mounjaro  5.0 mg SQ weekly Adverse side effects: None Hunger is moderately controlled.  Cravings are well controlled.  Assessment/Plan:   Type II Diabetes HgbA1c is at goal. Last A1c was 5.1 CBGs: Not checking      Episodes of hypoglycemia: no Medication(s): Mounjaro  5.0 mg SQ weekly  Lab Results  Component Value Date   HGBA1C 5.1 12/15/2023   HGBA1C 5.9 (H) 09/02/2022   HGBA1C 6.2 (H) 12/31/2021   Lab Results  Component Value Date   LDLCALC 109 12/15/2023   CREATININE 0.98 06/02/2024   No results found for: GFR  Plan: Continue Mounjaro  5.0 mg SQ weekly, but  will take every 10 days at this time. If she continues to lose weight will go back to Mounjaro  2.5 mg SQ weekly.  Will keep all carbohydrates low both sweets and starches.  Will continue exercise regimen to 30 to 60 minutes on most days of the week.  Aim for 7 to 9 hours of sleep nightly.  Eat more low glycemic index foods.    Hyperlipidemia LDL is not at goal. Medication(s): Pravastatin   Cardiovascular risk factors: diabetes mellitus, dyslipidemia, obesity (BMI >= 30 kg/m2), and sedentary lifestyle  Lab Results  Component Value Date   CHOL 202 (A) 12/15/2023   HDL 78 (A) 12/15/2023   LDLCALC 109 12/15/2023   TRIG 67 12/15/2023   Lab Results  Component Value Date   ALT 12 06/02/2024   AST 13 06/02/2024   ALKPHOS 50 12/15/2023   BILITOT 0.8 06/02/2024   The 10-year ASCVD risk score (Arnett DK, et al.,  2019) is: 7.5%*   Values used to calculate the score:     Age: 58 years     Clincally relevant sex: Female     Is Non-Hispanic African American: Yes     Diabetic: Yes     Tobacco smoker: No     Systolic Blood Pressure: 124 mmHg     Is BP treated: Yes     HDL Cholesterol: 78 mg/dL*     Total Cholesterol: 202 mg/dL*     * - Cholesterol units were assumed for this score calculation  Plan:  Continue statin.  Will eat her meals consistently.  Will increase her protein. Will avoid all trans fats.  Will read labels Will minimize saturated fats except the following: low fat meats in moderation, diary, and limited dark chocolate.    Generalized Obesity: Current BMI BMI (Calculated): 21.48   Pharmacotherapy Plan Continue  Mounjaro  5.0 mg SQ weekly, but will take every 10 days.   Jenna Chavez is currently in the action stage of change. As such, her goal is to maintain weight for now.  She has agreed to the Category 2 plan.  Exercise goals: All adults should avoid inactivity. Some physical activity is better than none, and adults who participate in any amount of physical activity  gain some health benefits.  Behavioral modification strategies: increasing lean protein intake, meal planning , better snacking choices, planning for success, increasing vegetables, keep healthy foods in the home, weigh protein portions, and measure portion sizes.  Jenna Chavez has agreed to follow-up with our clinic in 4 weeks.      Objective:   VITALS: Per patient if applicable, see vitals. GENERAL: Alert and in no acute distress. CARDIOPULMONARY: No increased WOB. Speaking in clear sentences.  PSYCH: Pleasant and cooperative. Speech normal rate and rhythm. Affect is appropriate. Insight and judgement are appropriate. Attention is focused, linear, and appropriate.  NEURO: Oriented as arrived to appointment on time with no prompting.   Attestation Statements:   This was prepared with the assistance of Engineer, civil (consulting).  Occasional wrong-word or sound-a-like substitutions may have occurred due to the inherent limitations of voice recognition   Clayborne Daring, DO

## 2024-07-22 NOTE — Telephone Encounter (Signed)
 I have verified that the images for both MRIs have been powershared to [facility]. I spent 25 minutes on the phone with [facility] and now waiting for call back to see if they can provide an over read as requested by Dr. Wilhelmenia.

## 2024-07-26 NOTE — Telephone Encounter (Signed)
 Never received phone call back. [facility] radiology IT again today. Spoke with Sari. Sent over email with requested information for over-reads. She stated she would attempt to expedite.

## 2024-07-27 NOTE — Telephone Encounter (Signed)
 Update from [facility] today, order's created for over-read. Waiting for radiologist to read MRIs.

## 2024-07-29 NOTE — Telephone Encounter (Signed)
 Received phone message from [place] [facility] [place]. Both MRIs have now been over read by radiologist and reports and images are in PACS for viewing.   I will reach out to Dr. Wilhelmenia to get her scheduled for EUS as previously planned. Please let pt know.

## 2024-07-30 NOTE — Telephone Encounter (Signed)
 Pt was made aware and verbalized understanding.

## 2024-07-31 NOTE — Telephone Encounter (Signed)
 Please let patient know, Dr. Wilhelmenia is advising that with increase in size that she should have EUS but unfortunately his schedule does not allow him to do it until December and he is advising we schedule elsewhere.  Mindy, can we expedite referral to [facility] for EUS for enlarging pancreatic lesion.  I worked over six weeks to get information from her MRIs from [place] sent to [facility] and over read so I really would like to get her in asap somewhere.   Please share reports from  12/10/2022 MR Abd/MRCP [facility] 05/26/23 MRI Abd MRCP [facility] 05/18/24 MRI Abd MRCP [facility] Last ov note from 06/08/24 Labs from 06/02/24

## 2024-08-03 NOTE — Addendum Note (Signed)
 Addended by: JEANELL GRAEME RAMAN on: 08/03/2024 09:35 AM   Modules accepted: Orders

## 2024-08-03 NOTE — Telephone Encounter (Signed)
 Pt aware and agreeable to refer to [facility]

## 2024-08-09 ENCOUNTER — Telehealth: Payer: Self-pay | Admitting: Bariatrics

## 2024-08-09 ENCOUNTER — Other Ambulatory Visit: Payer: Self-pay

## 2024-08-09 DIAGNOSIS — E119 Type 2 diabetes mellitus without complications: Secondary | ICD-10-CM

## 2024-08-09 MED ORDER — MOUNJARO 2.5 MG/0.5ML ~~LOC~~ SOAJ: 2.5000 mg | SUBCUTANEOUS | 0 refills | Status: DC

## 2024-08-09 NOTE — Telephone Encounter (Signed)
 The patient indicated that she was supposed to receive 2.5 mg of Mounjaro , but the pharmacy received the 5 mg dosage instead. Can we arrange to have this changed? The pharmacy she would like to use is located at [address] [place], at [facility]. Their phone number is [phone]. Once this is resolved, please either call the patient or send a MyChart message. Thank you!

## 2024-08-10 NOTE — Telephone Encounter (Signed)
 Patient has consultation for possible EUS 08/11/24 at [facility].

## 2024-08-12 ENCOUNTER — Other Ambulatory Visit: Payer: Self-pay | Admitting: Bariatrics

## 2024-08-13 ENCOUNTER — Other Ambulatory Visit: Payer: Self-pay | Admitting: Bariatrics

## 2024-08-16 ENCOUNTER — Encounter: Payer: Self-pay | Admitting: Bariatrics

## 2024-08-16 ENCOUNTER — Ambulatory Visit: Admitting: Bariatrics

## 2024-08-16 VITALS — BP 136/77 | HR 65 | Temp 97.7°F | Ht 66.0 in | Wt 132.0 lb

## 2024-08-16 DIAGNOSIS — E119 Type 2 diabetes mellitus without complications: Secondary | ICD-10-CM

## 2024-08-16 DIAGNOSIS — K219 Gastro-esophageal reflux disease without esophagitis: Secondary | ICD-10-CM

## 2024-08-16 DIAGNOSIS — E669 Obesity, unspecified: Secondary | ICD-10-CM

## 2024-08-16 DIAGNOSIS — Z6821 Body mass index (BMI) 21.0-21.9, adult: Secondary | ICD-10-CM

## 2024-08-16 DIAGNOSIS — Z7985 Long-term (current) use of injectable non-insulin antidiabetic drugs: Secondary | ICD-10-CM

## 2024-08-16 MED ORDER — PANTOPRAZOLE SODIUM 20 MG PO TBEC: 20.0000 mg | DELAYED_RELEASE_TABLET | Freq: Every day | ORAL | 0 refills | Status: AC

## 2024-08-16 NOTE — Progress Notes (Signed)
 WEIGHT SUMMARY AND BIOMETRICS  Weight Lost Since Last Visit: 1lb  Weight Gained Since Last Visit: 0   Vitals Temp: 97.7 F (36.5 C) BP: 136/77 Pulse Rate: 65 SpO2: 99 %   Anthropometric Measurements Height: 5' 6 (1.676 m) Weight: 132 lb (59.9 kg) BMI (Calculated): 21.32 Weight at Last Visit: 133lb Weight Lost Since Last Visit: 1lb Weight Gained Since Last Visit: 0 Starting Weight: 223lb Total Weight Loss (lbs): 91 lb (41.3 kg)   Body Composition  Body Fat %: 31.6 % Fat Mass (lbs): 42 lbs Muscle Mass (lbs): 86.2 lbs Total Body Water  (lbs): 60.6 lbs Visceral Fat Rating : 6   Other Clinical Data Fasting: no Labs: no Today's Visit #: 37 Starting Date: 07/03/21    OBESITY Jenna Chavez is here to discuss her progress with her obesity treatment plan along with follow-up of her obesity related diagnoses.    Nutrition Plan: the Category 2 plan - 40% adherence.  Current exercise: none  Interim History:  She is down 1 lb since her last visit. She is attempting to maintain her weight.  Eating all of the food on the plan., Protein intake is as prescribed, Is not skipping meals, and Water  intake is adequate.   Pharmacotherapy: Jenna Chavez is on Mounjaro  2.5 mg SQ weekly Adverse side effects: None Hunger is well controlled.  Cravings are well controlled.  Assessment/Plan:   Type II Diabetes HgbA1c is at goal. Last A1c was 5.1 CBGs: Not checking      Episodes of hypoglycemia: no Medication(s): Mounjaro  2.5 mg SQ weekly  Lab Results  Component Value Date   HGBA1C 5.1 12/15/2023   HGBA1C 5.9 (H) 09/02/2022   HGBA1C 6.2 (H) 12/31/2021   Lab Results  Component Value Date   LDLCALC 109 12/15/2023   CREATININE 0.98 06/02/2024   No results found for: GFR  Plan: Continue Mounjaro  2.5 mg SQ weekly Will keep all carbohydrates low both sweets and starches.   Will continue exercise regimen to 30 to 60 minutes on most days of the week.   Gastroesophageal reflux disease without esophagitis Symptoms are well-controlled. Medication: Protonix   Plan: Continue PPI but taking it every other day. Use antacids such as Tums or Mylanta as needed. Avoid trigger foods (spicy foods, fatty/fried foods, acidic foods, coffee, alcohol). Avoid eating within 3 hours of bedtime. Continue to work weight maintenance.    Generalized Obesity: Current BMI BMI (Calculated): 21.32   Pharmacotherapy Plan Continue  Mounjaro  2.5 mg SQ weekly  Jenna Chavez is currently in the action stage of change. As such, her goal is to maintain weight for now.  She has agreed to the Category 2 plan.  Exercise goals: All adults should avoid inactivity. Some physical activity is better than none, and adults who participate in any amount of physical activity gain some health benefits. She is very active overall.   Behavioral modification strategies:  decreasing simple carbohydrates , no meal skipping, meal planning , planning for success, increasing vegetables, increasing fiber rich foods, keep healthy foods in the home, weigh protein portions, and measure portion sizes.  Jenna Chavez has agreed to follow-up with our clinic in 4 weeks.    Objective:   VITALS: Per patient if applicable, see vitals. GENERAL: Alert and in no acute distress. CARDIOPULMONARY: No increased WOB. Speaking in clear sentences.  PSYCH: Pleasant and cooperative. Speech normal rate and rhythm. Affect is appropriate. Insight and judgement are appropriate. Attention is focused, linear, and appropriate.  NEURO: Oriented as arrived to appointment on time with no prompting.   Attestation Statements:   This was prepared with the assistance of Engineer, Civil (consulting).  Occasional wrong-word or sound-a-like substitutions may have occurred due to the inherent limitations of voice recognition   Clayborne Daring, DO

## 2024-08-23 NOTE — Telephone Encounter (Signed)
 Patient has EUS scheduled 10/12/24.

## 2024-09-12 ENCOUNTER — Other Ambulatory Visit: Payer: Self-pay | Admitting: Bariatrics

## 2024-09-13 ENCOUNTER — Encounter: Payer: Self-pay | Admitting: Bariatrics

## 2024-09-13 ENCOUNTER — Ambulatory Visit: Admitting: Bariatrics

## 2024-09-13 VITALS — BP 137/79 | HR 64 | Ht 66.0 in | Wt 133.0 lb

## 2024-09-13 DIAGNOSIS — E669 Obesity, unspecified: Secondary | ICD-10-CM | POA: Diagnosis not present

## 2024-09-13 DIAGNOSIS — E119 Type 2 diabetes mellitus without complications: Secondary | ICD-10-CM

## 2024-09-13 DIAGNOSIS — Z6821 Body mass index (BMI) 21.0-21.9, adult: Secondary | ICD-10-CM | POA: Diagnosis not present

## 2024-09-13 DIAGNOSIS — I10 Essential (primary) hypertension: Secondary | ICD-10-CM

## 2024-09-13 DIAGNOSIS — Z7985 Long-term (current) use of injectable non-insulin antidiabetic drugs: Secondary | ICD-10-CM

## 2024-09-13 MED ORDER — NEBIVOLOL HCL 10 MG PO TABS: 10.0000 mg | ORAL_TABLET | Freq: Every day | ORAL | 0 refills | Status: AC

## 2024-09-13 MED ORDER — MOUNJARO 2.5 MG/0.5ML ~~LOC~~ SOAJ
2.5000 mg | SUBCUTANEOUS | 0 refills | Status: AC
Start: 2024-09-13 — End: 2024-12-12

## 2024-09-13 MED ORDER — LISINOPRIL 5 MG PO TABS: 5.0000 mg | ORAL_TABLET | Freq: Every day | ORAL | 0 refills | Status: AC

## 2024-09-13 NOTE — Progress Notes (Signed)
 WEIGHT SUMMARY AND BIOMETRICS  Weight Lost Since Last Visit: 0  Weight Gained Since Last Visit: 1lb   Vitals BP: 137/79 Pulse Rate: 64 SpO2: 100 %   Anthropometric Measurements Height: 5' 6 (1.676 m) Weight: 133 lb (60.3 kg) BMI (Calculated): 21.48 Weight at Last Visit: 132lb Weight Lost Since Last Visit: 0 Weight Gained Since Last Visit: 1lb Starting Weight: 223lb Total Weight Loss (lbs): 90 lb (40.8 kg)   Body Composition  Body Fat %: 32.4 % Fat Mass (lbs): 43.2 lbs Muscle Mass (lbs): 85.8 lbs Total Body Water  (lbs): 60.8 lbs Visceral Fat Rating : 6   Other Clinical Data Fasting: no Labs: no Today's Visit #: 32 Starting Date: 07/03/21    OBESITY Jenna Chavez is here to discuss her progress with her obesity treatment plan along with follow-up of her obesity related diagnoses.    Nutrition Plan: the Category 2 plan - 40% adherence.  Current exercise: none  Interim History:  She is up 1 lb since her last visit.  Not eating all of the food on the plan., Protein intake is as prescribed, Is not skipping meals, and Water  intake is adequate.   Pharmacotherapy: [place] is on Mounjaro  2.5 mg SQ weekly Adverse side effects: None Hunger is well controlled.  Cravings are moderately controlled.  Assessment/Plan:   Type II Diabetes HgbA1c is at goal. Last A1c was 5.1 CBGs: Not checking      Episodes of hypoglycemia: no Medication(s): Mounjaro  2.5 mg SQ weekly  Lab Results  Component Value Date   HGBA1C 5.1 12/15/2023   HGBA1C 5.9 (H) 09/02/2022   HGBA1C 6.2 (H) 12/31/2021   Lab Results  Component Value Date   LDLCALC 109 12/15/2023   CREATININE 0.98 06/02/2024   No results found for: GFR  Plan: Continue and refill Mounjaro  2.5 mg SQ weekly Continue all other medications.  Will keep all carbohydrates low both sweets and starches.  Will  continue exercise regimen to 30 to 60 minutes on most days of the week.  Holiday strategies.    Hypertension Hypertension stable.  Medication(s): Bystolic  5 mg, Bystolic  10 mg , and Lisinopril  5 mg  BP Readings from Last 3 Encounters:  09/13/24 137/79  08/16/24 136/77  07/19/24 124/79   Lab Results  Component Value Date   CREATININE 0.98 06/02/2024   CREATININE 1.1 12/15/2023   CREATININE 1.33 (H) 12/09/2022   No results found for: GFR  Plan: Continue all antihypertensives at current dosages. See RX for the following: Lisinopril  5 mg 1 po daily # 30 with no refills and Bystolic  10 mg 1 po daily # 30 with no refills.   No added salt. Will keep sodium content to 1,500 mg or less per day.     Generalized Obesity: Current BMI BMI (Calculated): 21.48   Pharmacotherapy Plan Continue and refill  Mounjaro  2.5 mg SQ weekly  Jenna Chavez  is currently in the action stage of change. As such, her goal is to maintain weight for now.  She has agreed to the Category 2 plan.  Exercise goals: For substantial health benefits, adults should do at least 150 minutes (2 hours and 30 minutes) a week of moderate-intensity, or 75 minutes (1 hour and 15 minutes) a week of vigorous-intensity aerobic physical activity, or an equivalent combination of moderate- and vigorous-intensity aerobic activity. Aerobic activity should be performed in episodes of at least 10 minutes, and preferably, it should be spread throughout the week.  Behavioral modification strategies: increasing lean protein intake, decreasing simple carbohydrates , no meal skipping, meal planning , increase water  intake, planning for success, increasing vegetables, increasing fiber rich foods, increase frequency of journaling, weigh protein portions, and mindful eating.  Jenna Chavez has agreed to follow-up with our clinic in 4 weeks.   No orders of the defined types were placed in this encounter.   Medications Discontinued During This Encounter   Medication Reason   docusate sodium  (COLACE) 100 MG capsule Patient Preference     No orders of the defined types were placed in this encounter.     Objective:   VITALS: Per patient if applicable, see vitals. GENERAL: Alert and in no acute distress. CARDIOPULMONARY: No increased WOB. Speaking in clear sentences.  PSYCH: Pleasant and cooperative. Speech normal rate and rhythm. Affect is appropriate. Insight and judgement are appropriate. Attention is focused, linear, and appropriate.  NEURO: Oriented as arrived to appointment on time with no prompting.   Attestation Statements:   This was prepared with the assistance of Engineer, Civil (consulting).  Occasional wrong-word or sound-a-like substitutions may have occurred due to the inherent limitations of voice recognition.  Clayborne Daring, DO

## 2024-10-01 ENCOUNTER — Telehealth: Payer: Self-pay | Admitting: Cardiology

## 2024-10-01 NOTE — Telephone Encounter (Signed)
 Patient is agreeable to monitor symptoms and wait on monitor at this point.

## 2024-10-01 NOTE — Telephone Encounter (Signed)
 Patient has had CP intermittently since the end of November. Describes as all over her chest which lasts for a few minutes. Sometimes is relieved with water , describes as more annoying than anything. It is not brought on by any exertion, just happens No associated symptoms of SOB, radiation to jaw or arm,N/V,diaphoresis . No palpitations noted.

## 2024-10-01 NOTE — Telephone Encounter (Signed)
° °  Pt c/o of Chest Pain: STAT if active CP, including tightness, pressure, jaw pain, radiating pain to shoulder/upper arm/back, CP unrelieved by Nitro. Symptoms reported of SOB, nausea, vomiting, sweating.  1. Are you having CP right now? No    2. Are you experiencing any other symptoms (ex. SOB, nausea, vomiting, sweating)? No   3. Is your CP continuous or coming and going? Coming and going   4. Have you taken Nitroglycerin? No   5. How long have you been experiencing CP? Off and on since  11/ 27/25   6. If NO CP at time of call then end call with telling Pt to call back or call 911 if Chest pain returns prior to return call from triage team.   PT wants to discuss getting a heart monitor

## 2024-10-25 ENCOUNTER — Ambulatory Visit: Admitting: Bariatrics

## 2024-10-25 ENCOUNTER — Ambulatory Visit: Admitting: Family Medicine

## 2024-11-07 ENCOUNTER — Other Ambulatory Visit: Payer: Self-pay | Admitting: Bariatrics
